# Patient Record
Sex: Female | Born: 1970
Health system: Southern US, Community
[De-identification: ages and names within clinical notes are randomized; demographics above are authoritative.]

## PROBLEM LIST (undated history)

## (undated) DIAGNOSIS — R112 Nausea with vomiting, unspecified: Secondary | ICD-10-CM

## (undated) DIAGNOSIS — J45909 Unspecified asthma, uncomplicated: Secondary | ICD-10-CM

## (undated) DIAGNOSIS — I10 Essential (primary) hypertension: Secondary | ICD-10-CM

## (undated) DIAGNOSIS — R0600 Dyspnea, unspecified: Secondary | ICD-10-CM

## (undated) DIAGNOSIS — F32A Depression, unspecified: Secondary | ICD-10-CM

## (undated) DIAGNOSIS — T7840XA Allergy, unspecified, initial encounter: Secondary | ICD-10-CM

## (undated) DIAGNOSIS — C50919 Malignant neoplasm of unspecified site of unspecified female breast: Secondary | ICD-10-CM

## (undated) DIAGNOSIS — U071 COVID-19: Secondary | ICD-10-CM

## (undated) DIAGNOSIS — E669 Obesity, unspecified: Secondary | ICD-10-CM

## (undated) DIAGNOSIS — R7303 Prediabetes: Secondary | ICD-10-CM

## (undated) DIAGNOSIS — Z9889 Other specified postprocedural states: Secondary | ICD-10-CM

## (undated) DIAGNOSIS — M1990 Unspecified osteoarthritis, unspecified site: Secondary | ICD-10-CM

## (undated) DIAGNOSIS — F419 Anxiety disorder, unspecified: Secondary | ICD-10-CM

## (undated) HISTORY — DX: Essential (primary) hypertension: I10

## (undated) HISTORY — PX: FOOT SURGERY: SHX648

## (undated) HISTORY — DX: Obesity, unspecified: E66.9

## (undated) HISTORY — DX: Malignant neoplasm of unspecified site of unspecified female breast: C50.919

## (undated) HISTORY — DX: Allergy, unspecified, initial encounter: T78.40XA

## (undated) HISTORY — PX: NECK SURGERY: SHX720

## (undated) HISTORY — DX: Unspecified asthma, uncomplicated: J45.909

---

## 2004-05-17 ENCOUNTER — Emergency Department: Payer: Self-pay | Admitting: Emergency Medicine

## 2005-03-18 ENCOUNTER — Ambulatory Visit: Payer: Self-pay | Admitting: Podiatry

## 2006-03-22 ENCOUNTER — Ambulatory Visit: Payer: Self-pay

## 2006-09-04 ENCOUNTER — Ambulatory Visit: Payer: Self-pay | Admitting: Gynecology

## 2007-10-01 ENCOUNTER — Encounter (INDEPENDENT_AMBULATORY_CARE_PROVIDER_SITE_OTHER): Payer: Self-pay | Admitting: Gynecology

## 2007-10-01 ENCOUNTER — Ambulatory Visit: Payer: Self-pay | Admitting: Gynecology

## 2008-10-07 ENCOUNTER — Encounter: Payer: Self-pay | Admitting: Obstetrics & Gynecology

## 2008-10-07 ENCOUNTER — Ambulatory Visit: Payer: Self-pay | Admitting: Obstetrics & Gynecology

## 2008-10-29 ENCOUNTER — Ambulatory Visit: Payer: Self-pay | Admitting: Obstetrics & Gynecology

## 2009-12-29 ENCOUNTER — Ambulatory Visit: Payer: Self-pay | Admitting: Obstetrics & Gynecology

## 2010-01-07 ENCOUNTER — Ambulatory Visit: Payer: Self-pay | Admitting: Obstetrics & Gynecology

## 2010-09-28 ENCOUNTER — Ambulatory Visit: Payer: Self-pay | Admitting: Obstetrics and Gynecology

## 2010-09-28 ENCOUNTER — Ambulatory Visit: Payer: Commercial Indemnity | Admitting: Obstetrics and Gynecology

## 2010-09-28 ENCOUNTER — Encounter: Payer: Self-pay | Admitting: Family Medicine

## 2010-09-28 DIAGNOSIS — Z1272 Encounter for screening for malignant neoplasm of vagina: Secondary | ICD-10-CM

## 2010-09-28 DIAGNOSIS — Z01419 Encounter for gynecological examination (general) (routine) without abnormal findings: Secondary | ICD-10-CM

## 2010-09-28 LAB — CONVERTED CEMR LAB
ALT: 17 U/L
AST: 18 U/L
Albumin: 4.3 g/dL
Alkaline Phosphatase: 61 U/L
BUN: 11 mg/dL
CO2: 22 meq/L
Calcium: 8.7 mg/dL
Chloride: 99 meq/L
Cholesterol: 154 mg/dL
Creatinine, Ser: 0.57 mg/dL
Glucose, Bld: 87 mg/dL
HCT: 41.8 %
HDL: 44 mg/dL
Hemoglobin: 14.6 g/dL
LDL Cholesterol: 95 mg/dL
MCHC: 34.9 g/dL
MCV: 86.9 fL
Platelets: 270 K/uL
Potassium: 4.1 meq/L
RBC: 4.81 M/uL
RDW: 12.6 %
Sodium: 141 meq/L
TSH: 1.304 u[IU]/mL
Total Bilirubin: 0.5 mg/dL
Total CHOL/HDL Ratio: 3.5
Total Protein: 7 g/dL
Triglycerides: 76 mg/dL
VLDL: 15 mg/dL
WBC: 9.1 10*3/microliter

## 2010-11-05 NOTE — Assessment & Plan Note (Signed)
NAME:  Amy Watson, Amy Watson             ACCOUNT NO.:  [id number]  MEDICAL RECORD NO.:  [id number]          PATIENT TYPE:  LOCATION:  [facility] at [facility]           FACILITY:  PHYSICIAN:  Tinnie Gens, MD             DATE OF BIRTH:  DATE OF SERVICE:  09/28/2010                                 CLINIC NOTE  CHIEF COMPLAINT:  Yearly exam and physical.  HISTORY OF PRESENT ILLNESS:  The patient is a 40 year old, gravida 2 para 2, who comes in today for annual exam.  She was last seen in May 2011.  She has been on Necon 1/35 continuously and does not have a cycle for greater than 1 year.  She complains today of some intermittent chest tightness and discomfort and increased need for inhalers.  She feels somewhat lightheaded.  She is concerned about shortness of breath and heart disease, and she has a primary care physician at [facility] who she is instructed to follow up with regarding these issues. Otherwise, she is without specific complaint today.  PAST MEDICAL HISTORY:  Significant for asthma and heart palpitations with a negative workup.  PAST SURGICAL HISTORY:  She had a surgery on her foot.  MEDICATIONS:  She is on Necon 1/35 one p.o. daily, Advair 100/50 two puffs twice daily, albuterol inhaler as needed, 2 puffs twice daily, an herbal supplement that she uses for hot flashes.  ALLERGIES:  None known.  OBSTETRICAL HISTORY:  She is a G2 P2 with two vaginal deliveries.  GYNECOLOGIC HISTORY:  No history of abnormal Pap.  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  She denies any tobacco or drug use.  She is an occasional alcohol drinker, approximately one drink per week.  She is separated from her husband and going to get a divorce.  FAMILY HISTORY:  She reports her mother had breast cancer at age 71. The patient's last mammogram was in 2011 and was normal.  She has skin cancer in her grandfather.  Her father has recently been diagnosed  with hypertension.  Diabetes.  A 14-point review of systems is reviewed.  At her previous GYN visit, she had blood in her stools and found to have internal hemorrhoids. This is being followed by her PCP.  She denies headache, vision changes, nausea, vomiting, diarrhea, constipation, blood in her stool, blood in her urine, swelling of her feet or ankles, otherwise, see HPI for the remainder of review of systems.  PHYSICAL EXAMINATION:  GENERAL:  Today, she is an obese female, in no acute distress. HEENT:  Normocephalic, atraumatic.  Sclerae anicteric. NECK:  Supple.  Normal thyroid. LUNGS:  Clear bilaterally. CARDIOVASCULAR:  Regular rate and rhythm without rubs, gallops, or murmurs. ABDOMEN:  Soft, nontender, nondistended. EXTREMITIES:  No cyanosis, clubbing, or edema. BREASTS:  Symmetric with everted nipples.  No masses.  No supraclavicular or axillary adenopathy. GENITOURINARY:  Normal external female genitalia.  BUS normal.  Vagina is pink and rugated.  Cervix is parous without lesion.  Uterus is small, anteverted.  No adnexal mass.  Genital exam is limited by body habitus.  IMPRESSION: 1. Gynecologic exam with Pap. 2. Chest pain.  PLAN: 1. We  will get fasting labs today to include CBC, CMP, lipid, and TSH. 2. Pap smear today. 3. Schedule mammogram for 1 year since her last one. 4. Follow up with her PCP regarding her chest pain, shortness of     breath.          ______________________________ Tinnie Gens, MD    TP/MEDQ  D:  09/28/2010  T:  09/29/2010  Job:  [id number]

## 2010-12-28 NOTE — Assessment & Plan Note (Signed)
NAME:  Amy Watson, Amy Watson             ACCOUNT NO.:  [id number]   MEDICAL RECORD NO.:  [id number]          PATIENT TYPE:  POB   LOCATION:  [facility] at [facility]         FACILITY:  [facility]   PHYSICIAN:  Jaynie Collins, MD     DATE OF BIRTH:  [birth year]   DATE OF SERVICE:                                  CLINIC NOTE   CHIEF COMPLAINT:  Annual examination.   HISTORY OF PRESENT ILLNESS:  The patient is a 40 year old gravida 2,  para 2 who is here today for her annual gynecologic examination.  The  patient was last seen in February 2010 and she was complaining of many  symptoms, was concerned about going through possible perimenopausal  premature ovarian failure.  She was more worried about the mood swings  at that time which she says interfered with her activities of daily  living. As a result, she was started on Effexor XR 75 mg daily.  Today,  the patient does report that her symptoms are significantly improved on  the Effexor and she denies any gynecologic issues.  She denies any  abnormal vaginal bleeding, discharge, or any other concerns.   PAST OBSTETRIC/GYNECOLOGIC HISTORY:  The patient is amenorrheic, Necon  1/35.  She has had 2 vaginal deliveries.  Normal Pap smears, the last  one was last year.  She is on Necon 1/35 for contraception, but is not  sexually active currently.   PAST MEDICAL HISTORY:  1. Asthma.  2. Heart palpitations with negative evaluation.   PAST SURGICAL HISTORY:  Foot surgery.   MEDICATIONS:  1. Necon 1/35 one tablet daily.  2. Advair 100/50 two puffs inhaled twice a day.  3. Albuterol inhaler as needed.  4. Effexor XR 75 mg daily.   ALLERGIES:  No known drug allergies.   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  She denies any tobacco, alcohol, or illicit drug use.  She  denies any past or current history of physical or sexual abuse.  The  patient is undergoing a separation from her husband currently.   FAMILY  HISTORY:  Remarkable for her mother being diagnosed at age 22  with breast cancer.  The patient's last mammogram in 2010 was normal and  will be scheduled for another mammogram at the end of this visit.  Otherwise, she denies any other family history of any gynecologic,  colon, or any other cancers.   REVIEW OF SYSTEMS:  The patient endorses having blood in her stool that  she has noticed over the past couple of days, otherwise no other  symptoms.   PHYSICAL EXAMINATION:  VITAL SIGNS:  Blood pressure 147/96, pulse 98,  weight 217 pounds, height 5 feet 6 inches.  GENERAL:  No apparent distress.  HEENT:  Normocephalic, atraumatic.  LUNGS:  Clear to auscultation bilaterally.  HEART:  Regular rate and rhythm.  NECK:  Supple.  No masses.  BREASTS:  Symmetric in size, soft, nontender, no abnormal masses, skin  changes, nipple discharge, or lymphadenopathy noted.  ABDOMEN:  Soft,  nontender, nondistended.  EXTREMITIES:  No cyanosis, clubbing, or edema.  PELVIC:  Normal external female genitalia.  Pink well rugated vagina.  Normal cervix with prominent ectropion.  Pap smear was obtained.  On  bimanual exam, the patient has a small mobile, nontender uterus, and  normal adnexa bilaterally.   ASSESSMENT AND PLAN:  The patient is a 40 year old gravida 2, para 2,  here for annual physical examination.  She had a normal breast  examination today and we will go ahead and order her for a mammogram for  her screening.  We will also follow up on the results of her Pap smear.  The patient does want refill for Necon 1/35 and her Effexor and this  will be given to her at the end of this encounter.  As showed, blood in  the stool, the patient was told to go to her primary care physician as  soon as possible.  On examination, no external hemorrhoids was noted, so  she needs evaluation for either internal structural anomalies or to rule  out neoplasia or any other causes for a gastrointestinal bleed.   The  patient was told to come in or call for any further gynecologic  concerns.           ______________________________  Jaynie Collins, MD     UA/MEDQ  D:  12/29/2009  T:  12/30/2009  Job:  [id number]

## 2010-12-28 NOTE — Assessment & Plan Note (Signed)
NAME:  Amy Watson, Amy Watson             ACCOUNT NO.:  [id number]   MEDICAL RECORD NO.:  [id number]          PATIENT TYPE:  POB   LOCATION:  [facility]         FACILITY:  [facility]   PHYSICIAN:  Johnella Moloney, MD        DATE OF BIRTH:  1971/06/08   DATE OF SERVICE:  10/07/2008                                  CLINIC NOTE   CHIEF COMPLAINT:  Annual exam, possible perimenopausal symptoms.   HISTORY OF PRESENT ILLNESS:  The patient is a 40 year old gravida 2,  para 2 who is here for her annual examination.  The patient has Amy Watson  followed in this practice for many years and was initially on the [facility]  1/58 for several years, which resulted in amenorrhea in January 2008.  She was then placed on Necon 135 and had multiple labs at that visit  which were negative.  She has continued to be amenorrheic for several  years.  In the last few months the patient does report that she has  noted having a lot of night sweats, hot flashes and very concerning mood  swings.  The patient is interested in discussing management of her mood  swings especially which she said interfere with her activities of daily  living.  She has no other gynecologic concerns.   PAST OBSTETRICAL/GYNECOLOGICAL HISTORY:  Menstrual history is as above.  The patient had two vaginal deliveries.  She had normal Pap smears.  Her  last one was on October 01, 2007.  The patient is on the Neecon135 for  contraception and she is sexually active with her husband and has no  concerns.   PAST MEDICAL HISTORY:  1. Asthma.  2. Heart palpitations and the patient is awaiting evaluation for her      palpitations.   PAST SURGICAL HISTORY:  Foot surgery.   MEDICATIONS:  1. Necon 135 p.o. daily.  2. Advair 100/50 two puffs inhaled b.i.d.  3. Albuterol inhaler as needed.   ALLERGIES:  No known drug allergies.   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  She denies any tobacco, alcohol or illicit drug  use.  She also  denies any past or current history of physical or sexual abuse.   FAMILY HISTORY:  Remarkable for her mother being diagnosed at age 23  with breast cancer.  The patient has not had any mammograms or other  screening in several years.  Her last mammogram in our system was in  2007 which was normal.   REVIEW OF SYSTEMS:  A 14-point comprehensive review of systems was  reviewed with the patient and was entirely negative apart from the  perimenopausal symptoms mentioned in the HPI.   PHYSICAL EXAMINATION:  Pulse 96, blood pressure 124/92, weight 195.5  pounds, height 5 feet 6 inches.  GENERAL:  No apparent distress.  HEENT:  Normocephalic, atraumatic.  LUNGS:  Clear to auscultation bilaterally.  HEART:  Regular rate and rhythm.  NECK:  Supple.  No masses.  BREASTS:  Symmetric, nontender.  No abnormal masses, skin changes,  nipple discharge or lymphadenopathy palpated or noted.  ABDOMEN:  Soft, nontender, nondistended.  EXTREMITIES:  No cyanosis,  clubbing or edema and nontender.  PELVIC:  Normal external female genitalia.  Pink well rugated vagina.  Normal cervix with prominent ectropion.  While obtaining a Pap smear the  patient was noted to have a significant amount of bleeding.  This could  be secondary to her ectropion but gonorrhea, chlamydia will be  reflexively checked on her Pap smear sample to rule out cervicitis.  On  bimanual exam the patient has a small mobile, nontender uterus and  normal adnexa.   ASSESSMENT/PLAN:  The patient is a 40 year old gravida 2, para 2 here  for her annual physical examination.  The patient also reports  perimenopausal symptoms.  The patient had a normal breast examination  today and given her family history of breast cancer in a first-degree  relative, we will go ahead and order a mammogram for screening.  We will  follow up on the results of this.  We will also follow up on the results  of her Pap smear.  As for her menopausal  symptoms, the patient was told  about the different modalities of treatment including hormonal treatment  and non-hormonal treatments.  The patient is already on Necon 135 which  she was told is the best hormonal therapy we can give her at this point  and she feels like her symptoms are not managed  just on the Necon.  After discussion about alternatives of hormonal therapy, the patient is  interested in trying Effexor.  She was given a prescription for Effexor  XR 75 mg p.o. daily and told that it would take a few weeks for it to  build up to a steady state in her system for effects to be seen.  The  patient was also told to go to the website www.menopause.org of the  American Menopausal Society for other suggestions about how to deal with  her menopausal symptoms including lifestyle changes, being in cool areas  and trying to wear light clothing to bed, etcetera.  She was told to  come back to the clinic if her symptoms worsen or if she wants to try  another modality for treatment.           ______________________________  Johnella Moloney, MD     UD/MEDQ  D:  10/07/2008  T:  10/07/2008  Job:  [id number]

## 2011-06-28 ENCOUNTER — Ambulatory Visit: Payer: Self-pay | Admitting: Allergy

## 2011-07-27 ENCOUNTER — Ambulatory Visit: Payer: Self-pay | Admitting: Obstetrics & Gynecology

## 2011-08-17 ENCOUNTER — Ambulatory Visit: Payer: Self-pay

## 2012-02-07 ENCOUNTER — Ambulatory Visit: Payer: Managed Care, Other (non HMO) | Admitting: Obstetrics & Gynecology

## 2012-02-20 ENCOUNTER — Other Ambulatory Visit: Payer: Self-pay | Admitting: Obstetrics & Gynecology

## 2012-02-27 DIAGNOSIS — R232 Flushing: Secondary | ICD-10-CM | POA: Insufficient documentation

## 2012-02-27 DIAGNOSIS — E669 Obesity, unspecified: Secondary | ICD-10-CM | POA: Insufficient documentation

## 2012-03-01 ENCOUNTER — Encounter: Payer: Self-pay | Admitting: Obstetrics & Gynecology

## 2012-03-01 ENCOUNTER — Ambulatory Visit (INDEPENDENT_AMBULATORY_CARE_PROVIDER_SITE_OTHER): Payer: BC Managed Care – PPO | Admitting: Obstetrics & Gynecology

## 2012-03-01 VITALS — BP 129/87 | HR 90 | Ht 66.0 in | Wt 223.0 lb

## 2012-03-01 DIAGNOSIS — Z113 Encounter for screening for infections with a predominantly sexual mode of transmission: Secondary | ICD-10-CM

## 2012-03-01 DIAGNOSIS — Z124 Encounter for screening for malignant neoplasm of cervix: Secondary | ICD-10-CM

## 2012-03-01 DIAGNOSIS — Z0000 Encounter for general adult medical examination without abnormal findings: Secondary | ICD-10-CM

## 2012-03-01 MED ORDER — NORETHINDRONE-ETH ESTRADIOL 0.5-35 MG-MCG PO TABS: 1.0000 | ORAL_TABLET | Freq: Every day | ORAL | Status: DC

## 2012-03-01 NOTE — Progress Notes (Signed)
Yearly exam/ mother dx with breast cancer 35-40.

## 2012-03-01 NOTE — Progress Notes (Signed)
Subjective:    Amy Watson is a 41 y.o. female who presents for an annual exam. The patient has no complaints today. She stopped taking Effexor because it caused her non-aura type migraines. She still has hot flashes on the OCPs. She would like to change back to the Necon that she was on earlier.The patient is not currently sexually active. GYN screening history: last pap: was normal. The patient wears seatbelts: yes. The patient participates in regular exercise: no. Has the patient ever been transfused or tattooed?: yes. (tattoos)The patient reports that there is not domestic violence in her life.   Menstrual History: OB History    Grav Para Term Preterm Abortions TAB SAB Ect Mult Living   2 2              Menarche age: 80 No LMP recorded. Patient is not currently having periods (Reason: Perimenopausal).    The following portions of the patient's history were reviewed and updated as appropriate: allergies, current medications, past family history, past medical history, past social history, past surgical history and problem list.  Review of Systems A comprehensive review of systems was negative. She is now divorced and not sexually active. She has 82 and 43 yo children.   Objective:    BP 129/87  Pulse 90  Ht 5\' 6"  (1.676 m)  Wt 223 lb (101.152 kg)  BMI 35.99 kg/m2  General Appearance:    Alert, cooperative, no distress, appears stated age  Head:    Normocephalic, without obvious abnormality, atraumatic  Eyes:    PERRL, conjunctiva/corneas clear, EOM's intact, fundi    benign, both eyes  Ears:    Normal TM's and external ear canals, both ears  Nose:   Nares normal, septum midline, mucosa normal, no drainage    or sinus tenderness  Throat:   Lips, mucosa, and tongue normal; teeth and gums normal  Neck:   Supple, symmetrical, trachea midline, no adenopathy;    thyroid:  no enlargement/tenderness/nodules; no carotid   bruit or JVD  Back:     Symmetric, no curvature, ROM normal,  no CVA tenderness  Lungs:     Clear to auscultation bilaterally, respirations unlabored  Chest Wall:    No tenderness or deformity   Heart:    Regular rate and rhythm, S1 and S2 normal, no murmur, rub   or gallop  Breast Exam:    No tenderness, masses, or nipple abnormality  Abdomen:     Soft, non-tender, bowel sounds active all four quadrants,    no masses, no organomegaly  Genitalia:    Normal female without lesion, discharge or tenderness, no atrophy, normal cervix with ectropion, NSSA, NT, no adnexal masses     Extremities:   Extremities normal, atraumatic, no cyanosis or edema  Pulses:   2+ and symmetric all extremities  Skin:   Skin color, texture, turgor normal, no rashes or lesions  Lymph nodes:   Cervical, supraclavicular, and axillary nodes normal  Neurologic:   CNII-XII intact, normal strength, sensation and reflexes    throughout  .    Assessment:    Healthy female exam.    Plan:     Mammogram. Pap smear.  Screening labs in the future (when fasting) I will change her OCPs back to [employer]

## 2012-03-06 ENCOUNTER — Other Ambulatory Visit: Payer: BC Managed Care – PPO | Admitting: *Deleted

## 2012-03-06 DIAGNOSIS — Z0000 Encounter for general adult medical examination without abnormal findings: Secondary | ICD-10-CM

## 2012-03-06 LAB — CBC
HCT: 39.8 % (ref 36.0–46.0)
MCV: 89 fL (ref 78.0–100.0)
Platelets: 260 10*3/uL (ref 150–400)
RBC: 4.47 MIL/uL (ref 3.87–5.11)
RDW: 13.1 % (ref 11.5–15.5)
WBC: 6.8 10*3/uL (ref 4.0–10.5)

## 2012-03-07 LAB — COMPREHENSIVE METABOLIC PANEL
ALT: 17 U/L (ref 0–35)
AST: 14 U/L (ref 0–37)
Albumin: 3.8 g/dL (ref 3.5–5.2)
CO2: 18 mEq/L — ABNORMAL LOW (ref 19–32)
Calcium: 8.2 mg/dL — ABNORMAL LOW (ref 8.4–10.5)
Chloride: 111 mEq/L (ref 96–112)
Potassium: 4.3 mEq/L (ref 3.5–5.3)

## 2012-03-07 LAB — TSH: TSH: 2.203 u[IU]/mL (ref 0.350–4.500)

## 2012-03-07 LAB — LIPID PANEL: Cholesterol: 143 mg/dL (ref 0–200)

## 2012-08-28 ENCOUNTER — Ambulatory Visit: Payer: Self-pay | Admitting: Nurse Practitioner

## 2012-09-12 ENCOUNTER — Ambulatory Visit (INDEPENDENT_AMBULATORY_CARE_PROVIDER_SITE_OTHER): Payer: BC Managed Care – PPO | Admitting: Obstetrics & Gynecology

## 2012-09-12 ENCOUNTER — Encounter: Payer: Self-pay | Admitting: Obstetrics & Gynecology

## 2012-09-12 ENCOUNTER — Ambulatory Visit: Payer: BC Managed Care – PPO | Admitting: Obstetrics & Gynecology

## 2012-09-12 VITALS — BP 134/88 | HR 90 | Ht 66.0 in | Wt 211.0 lb

## 2012-09-12 DIAGNOSIS — F419 Anxiety disorder, unspecified: Secondary | ICD-10-CM

## 2012-09-12 DIAGNOSIS — F411 Generalized anxiety disorder: Secondary | ICD-10-CM

## 2012-09-12 MED ORDER — VENLAFAXINE HCL 37.5 MG PO TABS: 37.5000 mg | ORAL_TABLET | Freq: Two times a day (BID) | ORAL | Status: DC

## 2012-09-12 NOTE — Progress Notes (Signed)
  Subjective:    Patient ID: Amy Watson, female    DOB: Jan 02, 1971, 42 y.o.   MRN: [id number]  HPI  42 yo SW who was here for annual 7/13 and requested to restart her Necon for birth control and for control of hot flashes. She was seen by an Integrative therapist Pieter Partridge Something) in August 2013. She had blood tests and tells me "I am fully in menopause". She was put on progesterone and estradiol. She also had food allergy tests there and found multiple food allergies. She is here today because she would like to have a another prescription for "anxiety medication" ( Dr. Macon Large prescribed her effexor). She would like to restart this. She denies SI and HI.  Review of Systems     Objective:   Physical Exam        Assessment & Plan:  Anxiety, lots of stress (divorce, a "cutting" daughter)- restart effexor 43, start daily, increase BID prn RTC 1 month

## 2012-10-12 ENCOUNTER — Ambulatory Visit: Payer: BC Managed Care – PPO | Admitting: Obstetrics & Gynecology

## 2012-12-26 ENCOUNTER — Ambulatory Visit: Payer: Self-pay | Admitting: Obstetrics & Gynecology

## 2013-01-10 ENCOUNTER — Ambulatory Visit (INDEPENDENT_AMBULATORY_CARE_PROVIDER_SITE_OTHER): Payer: BC Managed Care – PPO | Admitting: Obstetrics & Gynecology

## 2013-01-10 ENCOUNTER — Ambulatory Visit: Payer: Self-pay | Admitting: Obstetrics & Gynecology

## 2013-01-10 ENCOUNTER — Encounter: Payer: Self-pay | Admitting: Obstetrics & Gynecology

## 2013-01-10 VITALS — BP 124/81 | HR 56 | Resp 16 | Ht 66.0 in | Wt 204.0 lb

## 2013-01-10 DIAGNOSIS — Z0000 Encounter for general adult medical examination without abnormal findings: Secondary | ICD-10-CM

## 2013-01-10 DIAGNOSIS — Z124 Encounter for screening for malignant neoplasm of cervix: Secondary | ICD-10-CM

## 2013-01-10 DIAGNOSIS — Z01419 Encounter for gynecological examination (general) (routine) without abnormal findings: Secondary | ICD-10-CM

## 2013-01-10 DIAGNOSIS — Z1151 Encounter for screening for human papillomavirus (HPV): Secondary | ICD-10-CM

## 2013-01-10 LAB — COMPREHENSIVE METABOLIC PANEL
Albumin: 3.9 g/dL (ref 3.5–5.2)
CO2: 24 mEq/L (ref 19–32)
Calcium: 8.8 mg/dL (ref 8.4–10.5)
Glucose, Bld: 87 mg/dL (ref 70–99)
Potassium: 4.1 mEq/L (ref 3.5–5.3)
Sodium: 137 mEq/L (ref 135–145)
Total Protein: 6.6 g/dL (ref 6.0–8.3)

## 2013-01-10 LAB — CBC
Hemoglobin: 13.8 g/dL (ref 12.0–15.0)
MCH: 30.5 pg (ref 26.0–34.0)
RBC: 4.53 MIL/uL (ref 3.87–5.11)

## 2013-01-10 LAB — TSH: TSH: 1.296 u[IU]/mL (ref 0.350–4.500)

## 2013-01-10 NOTE — Progress Notes (Signed)
Subjective:    Amy Watson is a 42 y.o. female who presents for an annual exam. The patient has no complaints today. The patient is not currently sexually active. GYN screening history: last pap: was normal. The patient wears seatbelts: yes. The patient participates in regular exercise: yes. Has the patient ever been transfused or tattooed?: yes. (tattoo) The patient reports that there is not domestic violence in her life.   Menstrual History: OB History   Grav Para Term Preterm Abortions TAB SAB Ect Mult Living   2 2        2       Menarche age: 61  No LMP recorded. Patient is not currently having periods (Reason: Perimenopausal).    The following portions of the patient's history were reviewed and updated as appropriate: allergies, current medications, past family history, past medical history, past social history, past surgical history and problem list.  Review of Systems A comprehensive review of systems was negative. Her kids are 22 and 90 yo. 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)   Objective:    BP 124/81  Pulse 56  Resp 16  Ht 5\' 6"  (1.676 m)  Wt 204 lb (92.534 kg)  BMI 32.94 kg/m2  General Appearance:    Alert, cooperative, no distress, appears stated age  Head:    Normocephalic, without obvious abnormality, atraumatic  Eyes:    PERRL, conjunctiva/corneas clear, EOM's intact, fundi    benign, both eyes  Ears:    Normal TM's and external ear canals, both ears  Nose:   Nares normal, septum midline, mucosa normal, no drainage    or sinus tenderness  Throat:   Lips, mucosa, and tongue normal; teeth and gums normal  Neck:   Supple, symmetrical, trachea midline, no adenopathy;    thyroid:  no enlargement/tenderness/nodules; no carotid   bruit or JVD  Back:     Symmetric, no curvature, ROM normal, no CVA tenderness  Lungs:     Clear to auscultation bilaterally, respirations unlabored  Chest Wall:    No tenderness or deformity   Heart:    Regular rate and rhythm, S1  and S2 normal, no murmur, rub   or gallop  Breast Exam:    No tenderness, masses, or nipple abnormality  Abdomen:     Soft, non-tender, bowel sounds active all four quadrants,    no masses, no organomegaly  Genitalia:    Normal female without lesion, discharge or tenderness     Extremities:   Extremities normal, atraumatic, no cyanosis or edema  Pulses:   2+ and symmetric all extremities  Skin:   Skin color, texture, turgor normal, no rashes or lesions  Lymph nodes:   Cervical, supraclavicular, and axillary nodes normal  Neurologic:   CNII-XII intact, normal strength, sensation and reflexes    throughout  .    Assessment:    Healthy female exam.    Plan:     Thin prep Pap smear.  Fasting labs BRCA testing per pt request (Her mother had breast cancer at 66 yo)

## 2013-01-24 ENCOUNTER — Telehealth: Payer: Self-pay | Admitting: *Deleted

## 2013-01-24 NOTE — Telephone Encounter (Signed)
Pt called adv she needed lipid panel completed at last visit for insurance forms but wasn't done - I adv last lipid was less than one year ago and if insurance needs one more recent than 02-2012 then she could come in for that one test any morning but be sure she is fasting - Pt understood and wants to be sure lipids, tsh, cbc, and cmp completed annually when she comes in for pap.

## 2013-06-20 ENCOUNTER — Other Ambulatory Visit: Payer: Self-pay

## 2014-01-15 ENCOUNTER — Ambulatory Visit (INDEPENDENT_AMBULATORY_CARE_PROVIDER_SITE_OTHER): Payer: BC Managed Care – PPO | Admitting: Obstetrics & Gynecology

## 2014-01-15 VITALS — BP 137/90 | HR 79 | Ht 66.0 in | Wt 220.6 lb

## 2014-01-15 DIAGNOSIS — Z01419 Encounter for gynecological examination (general) (routine) without abnormal findings: Secondary | ICD-10-CM

## 2014-01-15 DIAGNOSIS — Z0000 Encounter for general adult medical examination without abnormal findings: Secondary | ICD-10-CM

## 2014-01-15 DIAGNOSIS — Z124 Encounter for screening for malignant neoplasm of cervix: Secondary | ICD-10-CM

## 2014-01-15 DIAGNOSIS — Z1151 Encounter for screening for human papillomavirus (HPV): Secondary | ICD-10-CM

## 2014-01-15 LAB — COMPREHENSIVE METABOLIC PANEL
ALT: 17 U/L (ref 0–35)
AST: 15 U/L (ref 0–37)
Albumin: 4.3 g/dL (ref 3.5–5.2)
Alkaline Phosphatase: 63 U/L (ref 39–117)
BILIRUBIN TOTAL: 0.6 mg/dL (ref 0.2–1.2)
BUN: 12 mg/dL (ref 6–23)
CALCIUM: 9.1 mg/dL (ref 8.4–10.5)
CHLORIDE: 103 meq/L (ref 96–112)
CO2: 27 mEq/L (ref 19–32)
CREATININE: 0.55 mg/dL (ref 0.50–1.10)
GLUCOSE: 81 mg/dL (ref 70–99)
Potassium: 4.4 mEq/L (ref 3.5–5.3)
Sodium: 139 mEq/L (ref 135–145)
Total Protein: 7 g/dL (ref 6.0–8.3)

## 2014-01-15 LAB — LIPID PANEL
CHOL/HDL RATIO: 3.3 ratio
CHOLESTEROL: 157 mg/dL (ref 0–200)
HDL: 47 mg/dL (ref >39)
LDL Cholesterol: 84 mg/dL (ref 0–99)
Triglycerides: 132 mg/dL (ref <150)
VLDL: 26 mg/dL (ref 0–40)

## 2014-01-15 LAB — CBC
HEMATOCRIT: 41.7 % (ref 36.0–46.0)
HEMOGLOBIN: 14.4 g/dL (ref 12.0–15.0)
MCH: 30.8 pg (ref 26.0–34.0)
MCHC: 34.5 g/dL (ref 30.0–36.0)
MCV: 89.1 fL (ref 78.0–100.0)
Platelets: 283 10*3/uL (ref 150–400)
RBC: 4.68 MIL/uL (ref 3.87–5.11)
RDW: 13 % (ref 11.5–15.5)
WBC: 9.1 10*3/uL (ref 4.0–10.5)

## 2014-01-15 LAB — TSH: TSH: 1.646 u[IU]/mL (ref 0.350–4.500)

## 2014-01-15 NOTE — Progress Notes (Signed)
Subjective:    Tinaya Ceballos is a 43 y.o. female who presents for an annual exam. The patient has no complaints today. The patient is not currently sexually active. GYN screening history: last pap: was normal. The patient wears seatbelts: yes. The patient participates in regular exercise: yes. Has the patient ever been transfused or tattooed?: yes. The patient reports that there is not domestic violence in her life.   Menstrual History: OB History   Grav Para Term Preterm Abortions TAB SAB Ect Mult Living   2 2        2       Menarche age: 10  No LMP recorded. Patient is not currently having periods (Reason: Perimenopausal).    The following portions of the patient's history were reviewed and updated as appropriate: allergies, current medications, past family history, past medical history, past social history, past surgical history and problem list.  Review of Systems A comprehensive review of systems was negative. She has 2 daughters ages 78 and 24. Works 2 jobs.   Objective:    BP 137/90  Pulse 79  Ht 5\' 6"  (1.676 m)  Wt 220 lb 9.6 oz (100.064 kg)  BMI 35.62 kg/m2  General Appearance:    Alert, cooperative, no distress, appears stated age  Head:    Normocephalic, without obvious abnormality, atraumatic  Eyes:    PERRL, conjunctiva/corneas clear, EOM's intact, fundi    benign, both eyes  Ears:    Normal TM's and external ear canals, both ears  Nose:   Nares normal, septum midline, mucosa normal, no drainage    or sinus tenderness  Throat:   Lips, mucosa, and tongue normal; teeth and gums normal  Neck:   Supple, symmetrical, trachea midline, no adenopathy;    thyroid:  no enlargement/tenderness/nodules; no carotid   bruit or JVD  Back:     Symmetric, no curvature, ROM normal, no CVA tenderness  Lungs:     Clear to auscultation bilaterally, respirations unlabored  Chest Wall:    No tenderness or deformity   Heart:    Regular rate and rhythm, S1 and S2 normal, no murmur, rub    or gallop  Breast Exam:    No tenderness, masses, or nipple abnormality  Abdomen:     Soft, non-tender, bowel sounds active all four quadrants,    no masses, no organomegaly  Genitalia:    Normal female without lesion, discharge or tenderness, NSSA, NT, mobile, normal adnexal exam     Extremities:   Extremities normal, atraumatic, no cyanosis or edema  Pulses:   2+ and symmetric all extremities  Skin:   Skin color, texture, turgor normal, no rashes or lesions  Lymph nodes:   Cervical, supraclavicular, and axillary nodes normal  Neurologic:   CNII-XII intact, normal strength, sensation and reflexes    throughout  .    Assessment:    Healthy female exam.    Plan:     Breast self exam technique reviewed and patient encouraged to perform self-exam monthly. Thin prep Pap smear. with cotesting (I did alert her to ACOG rec regarding paps). Fasting lab work Programmer, systems

## 2014-01-16 ENCOUNTER — Encounter: Payer: Self-pay | Admitting: Obstetrics & Gynecology

## 2014-01-16 LAB — CYTOLOGY - PAP

## 2014-03-18 ENCOUNTER — Ambulatory Visit: Payer: Self-pay | Admitting: Obstetrics & Gynecology

## 2014-04-18 ENCOUNTER — Ambulatory Visit: Payer: Self-pay | Admitting: Obstetrics & Gynecology

## 2014-06-16 ENCOUNTER — Encounter: Payer: Self-pay | Admitting: Obstetrics & Gynecology

## 2014-06-18 DIAGNOSIS — J45909 Unspecified asthma, uncomplicated: Secondary | ICD-10-CM | POA: Insufficient documentation

## 2014-06-18 DIAGNOSIS — F419 Anxiety disorder, unspecified: Secondary | ICD-10-CM | POA: Insufficient documentation

## 2014-06-18 DIAGNOSIS — I10 Essential (primary) hypertension: Secondary | ICD-10-CM | POA: Insufficient documentation

## 2014-06-18 DIAGNOSIS — F32A Depression, unspecified: Secondary | ICD-10-CM | POA: Insufficient documentation

## 2014-06-18 DIAGNOSIS — F329 Major depressive disorder, single episode, unspecified: Secondary | ICD-10-CM | POA: Insufficient documentation

## 2014-09-16 ENCOUNTER — Ambulatory Visit (INDEPENDENT_AMBULATORY_CARE_PROVIDER_SITE_OTHER): Payer: BLUE CROSS/BLUE SHIELD | Admitting: Podiatry

## 2014-09-16 ENCOUNTER — Encounter: Payer: Self-pay | Admitting: Podiatry

## 2014-09-16 VITALS — BP 122/76 | HR 90 | Resp 18

## 2014-09-16 DIAGNOSIS — M722 Plantar fascial fibromatosis: Secondary | ICD-10-CM

## 2014-09-16 MED ORDER — MELOXICAM 7.5 MG PO TABS: 7.5000 mg | ORAL_TABLET | Freq: Every day | ORAL | Status: DC

## 2014-09-16 MED ORDER — TRIAMCINOLONE ACETONIDE 10 MG/ML IJ SUSP
10.0000 mg | Freq: Once | INTRAMUSCULAR | Status: AC
  Administered 2014-09-16: 10 mg

## 2014-09-16 NOTE — Patient Instructions (Signed)
Plantar Fasciitis (Heel Spur Syndrome) with Rehab The plantar fascia is a fibrous, ligament-like, soft-tissue structure that spans the bottom of the foot. Plantar fasciitis is a condition that causes pain in the foot due to inflammation of the tissue. SYMPTOMS   Pain and tenderness on the underneath side of the foot.  Pain that worsens with standing or walking. CAUSES  Plantar fasciitis is caused by irritation and injury to the plantar fascia on the underneath side of the foot. Common mechanisms of injury include:  Direct trauma to bottom of the foot.  Damage to a small nerve that runs under the foot where the main fascia attaches to the heel bone.  Stress placed on the plantar fascia due to bone spurs. RISK INCREASES WITH:   Activities that place stress on the plantar fascia (running, jumping, pivoting, or cutting).  Poor strength and flexibility.  Improperly fitted shoes.  Tight calf muscles.  Flat feet.  Failure to warm-up properly before activity.  Obesity. PREVENTION  Warm up and stretch properly before activity.  Allow for adequate recovery between workouts.  Maintain physical fitness:  Strength, flexibility, and endurance.  Cardiovascular fitness.  Maintain a health body weight.  Avoid stress on the plantar fascia.  Wear properly fitted shoes, including arch supports for individuals who have flat feet. PROGNOSIS  If treated properly, then the symptoms of plantar fasciitis usually resolve without surgery. However, occasionally surgery is necessary. RELATED COMPLICATIONS   Recurrent symptoms that may result in a chronic condition.  Problems of the lower back that are caused by compensating for the injury, such as limping.  Pain or weakness of the foot during push-off following surgery.  Chronic inflammation, scarring, and partial or complete fascia tear, occurring more often from repeated injections. TREATMENT  Treatment initially involves the use of  ice and medication to help reduce pain and inflammation. The use of strengthening and stretching exercises may help reduce pain with activity, especially stretches of the Achilles tendon. These exercises may be performed at home or with a therapist. Your caregiver may recommend that you use heel cups of arch supports to help reduce stress on the plantar fascia. Occasionally, corticosteroid injections are given to reduce inflammation. If symptoms persist for greater than 6 months despite non-surgical (conservative), then surgery may be recommended.  MEDICATION   If pain medication is necessary, then nonsteroidal anti-inflammatory medications, such as aspirin and ibuprofen, or other minor pain relievers, such as acetaminophen, are often recommended.  Do not take pain medication within 7 days before surgery.  Prescription pain relievers may be given if deemed necessary by your caregiver. Use only as directed and only as much as you need.  Corticosteroid injections may be given by your caregiver. These injections should be reserved for the most serious cases, because they may only be given a certain number of times. HEAT AND COLD  Cold treatment (icing) relieves pain and reduces inflammation. Cold treatment should be applied for 10 to 15 minutes every 2 to 3 hours for inflammation and pain and immediately after any activity that aggravates your symptoms. Use ice packs or massage the area with a piece of ice (ice massage).  Heat treatment may be used prior to performing the stretching and strengthening activities prescribed by your caregiver, physical therapist, or athletic trainer. Use a heat pack or soak the injury in warm water. SEEK IMMEDIATE MEDICAL CARE IF:  Treatment seems to offer no benefit, or the condition worsens.  Any medications produce adverse side effects. EXERCISES RANGE   OF MOTION (ROM) AND STRETCHING EXERCISES - Plantar Fasciitis (Heel Spur Syndrome) These exercises may help you  when beginning to rehabilitate your injury. Your symptoms may resolve with or without further involvement from your physician, physical therapist or athletic trainer. While completing these exercises, remember:   Restoring tissue flexibility helps normal motion to return to the joints. This allows healthier, less painful movement and activity.  An effective stretch should be held for at least 30 seconds.  A stretch should never be painful. You should only feel a gentle lengthening or release in the stretched tissue. RANGE OF MOTION - Toe Extension, Flexion  Sit with your right / left leg crossed over your opposite knee.  Grasp your toes and gently pull them back toward the top of your foot. You should feel a stretch on the bottom of your toes and/or foot.  Hold this stretch for __________ seconds.  Now, gently pull your toes toward the bottom of your foot. You should feel a stretch on the top of your toes and or foot.  Hold this stretch for __________ seconds. Repeat __________ times. Complete this stretch __________ times per day.  RANGE OF MOTION - Ankle Dorsiflexion, Active Assisted  Remove shoes and sit on a chair that is preferably not on a carpeted surface.  Place right / left foot under knee. Extend your opposite leg for support.  Keeping your heel down, slide your right / left foot back toward the chair until you feel a stretch at your ankle or calf. If you do not feel a stretch, slide your bottom forward to the edge of the chair, while still keeping your heel down.  Hold this stretch for __________ seconds. Repeat __________ times. Complete this stretch __________ times per day.  STRETCH - Gastroc, Standing  Place hands on wall.  Extend right / left leg, keeping the front knee somewhat bent.  Slightly point your toes inward on your back foot.  Keeping your right / left heel on the floor and your knee straight, shift your weight toward the wall, not allowing your back to  arch.  You should feel a gentle stretch in the right / left calf. Hold this position for __________ seconds. Repeat __________ times. Complete this stretch __________ times per day. STRETCH - Soleus, Standing  Place hands on wall.  Extend right / left leg, keeping the other knee somewhat bent.  Slightly point your toes inward on your back foot.  Keep your right / left heel on the floor, bend your back knee, and slightly shift your weight over the back leg so that you feel a gentle stretch deep in your back calf.  Hold this position for __________ seconds. Repeat __________ times. Complete this stretch __________ times per day. STRETCH - Gastrocsoleus, Standing  Note: This exercise can place a lot of stress on your foot and ankle. Please complete this exercise only if specifically instructed by your caregiver.   Place the ball of your right / left foot on a step, keeping your other foot firmly on the same step.  Hold on to the wall or a rail for balance.  Slowly lift your other foot, allowing your body weight to press your heel down over the edge of the step.  You should feel a stretch in your right / left calf.  Hold this position for __________ seconds.  Repeat this exercise with a slight bend in your right / left knee. Repeat __________ times. Complete this stretch __________ times per day.    STRENGTHENING EXERCISES - Plantar Fasciitis (Heel Spur Syndrome)  These exercises may help you when beginning to rehabilitate your injury. They may resolve your symptoms with or without further involvement from your physician, physical therapist or athletic trainer. While completing these exercises, remember:   Muscles can gain both the endurance and the strength needed for everyday activities through controlled exercises.  Complete these exercises as instructed by your physician, physical therapist or athletic trainer. Progress the resistance and repetitions only as guided. STRENGTH -  Towel Curls  Sit in a chair positioned on a non-carpeted surface.  Place your foot on a towel, keeping your heel on the floor.  Pull the towel toward your heel by only curling your toes. Keep your heel on the floor.  If instructed by your physician, physical therapist or athletic trainer, add ____________________ at the end of the towel. Repeat __________ times. Complete this exercise __________ times per day. STRENGTH - Ankle Inversion  Secure one end of a rubber exercise band/tubing to a fixed object (table, pole). Loop the other end around your foot just before your toes.  Place your fists between your knees. This will focus your strengthening at your ankle.  Slowly, pull your big toe up and in, making sure the band/tubing is positioned to resist the entire motion.  Hold this position for __________ seconds.  Have your muscles resist the band/tubing as it slowly pulls your foot back to the starting position. Repeat __________ times. Complete this exercises __________ times per day.  Document Released: 08/01/2005 Document Revised: 10/24/2011 Document Reviewed: 11/13/2008 [employer] Patient Information 2015 [employer]. This information is not intended to replace advice given to you by your health care provider. Make sure you discuss any questions you have with your health care provider.  

## 2014-09-16 NOTE — Progress Notes (Signed)
   Subjective:    Patient ID: Amy Watson, female    DOB: 1970-08-27, 44 y.o.   MRN: [id number]  HPI  44 year old female returns the office they with complaints of bilateral heel pain with a left greater than right. She states that she has pain on the bottom of her heels. She states that his been ongoing for "a couple of months". She states this first started off with pain just in the morning or after periods of rest but has become more of a consistent dull, throbbing pain on the bottom of her heel more recently. The pain does not wake her up at night. She denies any history of injury or trauma to the area and she denies any change in activity the time of onset of symptoms. She denies any swelling or redness overlying the area. No other complaints at this time.   Review of Systems  Constitutional: Positive for activity change.  Endocrine:       EXCESSIVE THIRST  Musculoskeletal: Positive for gait problem.       JOINT PAIN  Allergic/Immunologic: Positive for environmental allergies and food allergies.  Hematological: Bruises/bleeds easily.  All other systems reviewed and are negative.      Objective:   Physical Exam AAO x3, NAD DP/PT pulses palpable bilaterally, CRT less than 3 seconds Protective sensation intact with Simms Weinstein monofilament, vibratory sensation intact, Achilles tendon reflex intact Tenderness to palpation overlying the plantar medial tubercle of the calcaneus to bilateral heels at the insertion of the plantar fascia. There is no pain along the course of plantar fascial within the arch of the foot. There is no pain with lateral compression of the calcaneus or pain the vibratory sensation. No pain on the posterior aspect of the calcaneus or along the course/insertion of the Achilles tendon. There is no overlying edema, erythema, increase in warmth bilaterally. No other areas of tenderness palpation or pain with vibratory sensation to the foot/ankle. MMT 5/5, ROM  WNL No open lesions or pre-ulcerative lesions are identified. No pain with calf compression, swelling, warmth, erythema.     Assessment & Plan:  44 year old female with bilateral heel pain left greater than right, likely plantar fasciitis -X-rays were obtained and reviewed with the patient. -Treatment options were discussed with the patient including alternatives, risks, complications. -Patient elects to proceed with steroid injection into the left heel. Under sterile skin preparation, a total of 2.5cc of kenalog 10, 0.5% Marcaine plain, and 2% lidocaine plain were infiltrated into the symptomatic area without complication. A band-aid was applied. Patient tolerated the injection well without complication. Post-injection care with discussed with the patient. Discussed with the patient to ice the area over the next couple of days to help prevent a steroid flare. Patient wishes to hold off on steroid injection of the right side   as it is not as symptomatic -Dispensed plantar fascial braces 2. -Discussed ice -Discussed stretching exercises. -Discussed shoe gear modifications/possible orthotics. -Prescribed meloxicam. Discussed side effects the medication fitted to stop immediately should any occur call the office. -Follow-up in 3 weeks or sooner should any problems arise. In the meantime, encouraged to call the office with any questions, concerns, change in symptoms. Follow-up with PCP for other issues mentioned in the review of systems.

## 2014-09-19 ENCOUNTER — Ambulatory Visit: Payer: Self-pay | Admitting: Podiatry

## 2014-10-07 ENCOUNTER — Ambulatory Visit (INDEPENDENT_AMBULATORY_CARE_PROVIDER_SITE_OTHER): Payer: BLUE CROSS/BLUE SHIELD | Admitting: Podiatry

## 2014-10-07 VITALS — BP 119/74 | HR 92 | Resp 18

## 2014-10-07 DIAGNOSIS — M722 Plantar fascial fibromatosis: Secondary | ICD-10-CM

## 2014-10-07 MED ORDER — TRIAMCINOLONE ACETONIDE 10 MG/ML IJ SUSP
10.0000 mg | Freq: Once | INTRAMUSCULAR | Status: AC
  Administered 2014-10-07: 10 mg

## 2014-10-08 NOTE — Progress Notes (Signed)
Patient ID: Amy Watson Overall, female   DOB: [birth year], 44 y.o.   MRN: [id number]  Subjective: 44 year old female returns the office today for follow-up evaluation of left heel pain, likely plantar fasciitis. She states that since last appointment the area is much better however she discontinued has intermittent pain. She's been continuing the stretching icing activities. She is also been wearing the plantar fascial braces. She is requesting another steroid injection at this time. She denies any swelling or redness overlying the area. No other complaints at this time in no acute changes.  Objective: AAO x3, NAD DP/PT pulses palpable bilaterally, CRT less than 3 seconds Protective sensation intact with Simms Weinstein monofilament, vibratory sensation intact, Achilles tendon reflex intact Tenderness to palpation overlying the plantar medial tubercle of the calcaneus to left heel at the insertion of the plantar fascia. There is mild discomfort to the right, but not as significant as the right. There is no pain along the course of plantar fascial within the arch of the foot. The plantar fascia appears intact bilaterally. There is no pain with lateral compression of the calcaneus or pain the vibratory sensation. No pain on the posterior aspect of the calcaneus or along the course/insertion of the Achilles tendon bilaterally. There is no overlying edema, erythema, increase in warmth. No other areas of tenderness palpation or pain with vibratory sensation to the foot/ankle. MMT 5/5, ROM WNL No open lesions or pre-ulcerative lesions are identified. No pain with calf compression, swelling, warmth, erythema.  Assessment: 44 year old female with resolving heel pain, left worse than right  Plan: -Treatment options discussed including alternatives, risks, complications. -Patient elects to proceed with steroid injection into the left heel. Under sterile skin preparation, a total of 2.5cc of kenalog 10, 0.5%  Marcaine plain, and 2% lidocaine plain were infiltrated into the symptomatic area without complication. A band-aid was applied. Patient tolerated the injection well without complication. Post-injection care with discussed with the patient. Discussed with the patient to ice the area over the next couple of days to help prevent a steroid flare. Plantar fascial taping was applied. Once the tape is removed can continue of plantar fascial brace. Continue with stretching, icing activities. Continue anti-inflammatories. -Follow-up in 3 weeks or sooner if any problems are to arise. In the meantime, encouraged to call the office with any questions, concerns, change in symptoms.

## 2014-10-13 ENCOUNTER — Other Ambulatory Visit: Payer: Self-pay | Admitting: Podiatry

## 2014-10-28 ENCOUNTER — Ambulatory Visit: Payer: BLUE CROSS/BLUE SHIELD | Admitting: Podiatry

## 2015-01-19 ENCOUNTER — Ambulatory Visit: Payer: BLUE CROSS/BLUE SHIELD | Admitting: Podiatry

## 2015-01-20 ENCOUNTER — Ambulatory Visit (INDEPENDENT_AMBULATORY_CARE_PROVIDER_SITE_OTHER): Payer: BLUE CROSS/BLUE SHIELD | Admitting: Podiatry

## 2015-01-20 ENCOUNTER — Encounter: Payer: Self-pay | Admitting: Podiatry

## 2015-01-20 VITALS — BP 159/89 | HR 80 | Resp 17 | Ht 66.0 in | Wt 215.0 lb

## 2015-01-20 DIAGNOSIS — M7741 Metatarsalgia, right foot: Secondary | ICD-10-CM

## 2015-01-20 DIAGNOSIS — M779 Enthesopathy, unspecified: Secondary | ICD-10-CM

## 2015-01-20 DIAGNOSIS — L905 Scar conditions and fibrosis of skin: Secondary | ICD-10-CM

## 2015-01-21 ENCOUNTER — Other Ambulatory Visit: Payer: Self-pay | Admitting: Podiatry

## 2015-01-21 NOTE — Progress Notes (Signed)
Patient ID: Amy Watson Overall, female   DOB: 06-25-1971, 44 y.o.   MRN: [id number]  Subjective: 44 year old female presents the office today for pain to her right foot. She states that she started have pain overlying the area from where she had neuroma surgery several years ago. She states that she feels as if there scar tissue and the area causes discomfort. She points along the fourth interspace and the right foot with the majority of her pain is out. She also points the ball of the foot on the right foot which has become symptomatic over the last several months. She denies any history of injury or trauma. She denies any swelling or redness. She states that her heel pain is resolved. She has been massaging the area to help break up the scar tissue. No other treatments. Denies any systemic complaints such as fevers, chills, nausea, vomiting. No acute changes since last appointment, and no other complaints at this time.   Objective: AAO x3, NAD DP/PT pulses palpable bilaterally, CRT less than 3 seconds Protective sensation intact with Simms Weinstein monofilament, vibratory sensation intact, Achilles tendon reflex intact A scar seen on the dorsal aspect of the right fourth interspace from prior surgery. There is tenderness patient upon interspace directly over the scar. There is also tenderness along the plantar aspect of the metatarsal heads protruding the metatarsal heads 2, 3, 4. There is no tenderness along the metatarsal heads on the dorsal aspect. There is no pain the vibratory sensation along the metatarsals or digits. No pain with MTPJ range of motion. There is no overlying edema, erythema, increase in warmth. There is no palpable neuroma identified. There is no pain with meals lateral compression of the metatarsals. No other areas of pinpoint bony tenderness or pain with vibratory sensation. MMT 5/5, ROM WNL. No edema, erythema, increase in warmth to bilateral lower extremities.  No open lesions  or pre-ulcerative lesions.  No pain with calf compression, swelling, warmth, erythema  Assessment: 44 year old female right fourth interspace pain possible scar tissue, metatarsalgia  Plan: -All treatment options discussed with the patient including all alternatives, risks, complications.  -I discussed steroid injection of the right fourth interspace to help decrease inflammation, break up scar tissue help with her pain. Risks and complications of the injection were discussed the patient for which she verbally understood and consented. Under sterile conditions a total of 2 mL mixture Kenalog 10, 0.5% Marcaine plain, 2% lidocaine plain was infiltrated into the right fourth interspace. A Band-Aid was then applied. Postinjection care was discussed the patient. She tolerated injection well any complications. -Dispensed metatarsal offloading pads -Meloxicam refill. She had no side effects or problems take the medication before. -Follow-up in 4 weeks or sooner if any problems are to arise. -Patient encouraged to call the office with any questions, concerns, change in symptoms.

## 2015-02-05 ENCOUNTER — Ambulatory Visit (INDEPENDENT_AMBULATORY_CARE_PROVIDER_SITE_OTHER): Payer: BLUE CROSS/BLUE SHIELD | Admitting: Obstetrics & Gynecology

## 2015-02-05 VITALS — BP 141/84 | HR 76 | Ht 66.0 in | Wt 220.0 lb

## 2015-02-05 DIAGNOSIS — Z1151 Encounter for screening for human papillomavirus (HPV): Secondary | ICD-10-CM

## 2015-02-05 DIAGNOSIS — Z124 Encounter for screening for malignant neoplasm of cervix: Secondary | ICD-10-CM | POA: Diagnosis not present

## 2015-02-05 DIAGNOSIS — Z01419 Encounter for gynecological examination (general) (routine) without abnormal findings: Secondary | ICD-10-CM | POA: Diagnosis not present

## 2015-02-05 DIAGNOSIS — Z113 Encounter for screening for infections with a predominantly sexual mode of transmission: Secondary | ICD-10-CM | POA: Diagnosis not present

## 2015-02-05 DIAGNOSIS — N3946 Mixed incontinence: Secondary | ICD-10-CM

## 2015-02-05 DIAGNOSIS — Z0000 Encounter for general adult medical examination without abnormal findings: Secondary | ICD-10-CM

## 2015-02-05 LAB — CBC
HCT: 39.4 % (ref 36.0–46.0)
Hemoglobin: 13.3 g/dL (ref 12.0–15.0)
MCH: 30.9 pg (ref 26.0–34.0)
MCHC: 33.8 g/dL (ref 30.0–36.0)
MCV: 91.6 fL (ref 78.0–100.0)
MPV: 8.9 fL (ref 8.6–12.4)
PLATELETS: 266 10*3/uL (ref 150–400)
RBC: 4.3 MIL/uL (ref 3.87–5.11)
RDW: 13.1 % (ref 11.5–15.5)
WBC: 7.4 10*3/uL (ref 4.0–10.5)

## 2015-02-05 LAB — COMPREHENSIVE METABOLIC PANEL
ALBUMIN: 4.1 g/dL (ref 3.5–5.2)
ALK PHOS: 61 U/L (ref 39–117)
ALT: 19 U/L (ref 0–35)
AST: 14 U/L (ref 0–37)
BILIRUBIN TOTAL: 0.5 mg/dL (ref 0.2–1.2)
BUN: 12 mg/dL (ref 6–23)
CO2: 22 meq/L (ref 19–32)
Calcium: 8.8 mg/dL (ref 8.4–10.5)
Chloride: 107 mEq/L (ref 96–112)
Creat: 0.49 mg/dL — ABNORMAL LOW (ref 0.50–1.10)
GLUCOSE: 93 mg/dL (ref 70–99)
POTASSIUM: 4 meq/L (ref 3.5–5.3)
SODIUM: 140 meq/L (ref 135–145)
Total Protein: 6.7 g/dL (ref 6.0–8.3)

## 2015-02-05 LAB — LIPID PANEL
Cholesterol: 156 mg/dL (ref 0–200)
HDL: 53 mg/dL (ref >=46)
LDL Cholesterol: 87 mg/dL (ref 0–99)
Total CHOL/HDL Ratio: 2.9 ratio
Triglycerides: 82 mg/dL (ref <150)
VLDL: 16 mg/dL (ref 0–40)

## 2015-02-05 LAB — TSH: TSH: 1.277 u[IU]/mL (ref 0.350–4.500)

## 2015-02-05 NOTE — Addendum Note (Signed)
Addended by: Lin Landsman C on: 02/05/2015 10:14 AM   Modules accepted: Orders

## 2015-02-05 NOTE — Progress Notes (Signed)
Here today for yearly gyn exam and fasting labs.  Has questions about incontinence. Has had bronchitis issues since May.

## 2015-02-05 NOTE — Progress Notes (Signed)
Subjective:    Amy Watson is a 44 y.o. DW P2 (32 and 69 yo daughters)  female who presents for an annual exam. The patient has no complaints today except worsening GSUI. The patient is not currently sexually active. GYN screening history: last pap: was normal. The patient wears seatbelts: yes. The patient participates in regular exercise: no. Has the patient ever been transfused or tattooed?: yes. The patient reports that there is not domestic violence in her life.   Menstrual History: OB History    Gravida Para Term Preterm AB TAB SAB Ectopic Multiple Living   2 2        2       Menarche age: 63  No LMP recorded. Patient is not currently having periods (Reason: Perimenopausal).    The following portions of the patient's history were reviewed and updated as appropriate: allergies, current medications, past family history, past medical history, past social history, past surgical history and problem list.  Review of Systems A comprehensive review of systems was negative. She works 2 jobs.   Objective:    BP 141/84 mmHg  Pulse 76  Ht 5\' 6"  (1.676 m)  Wt 220 lb (99.791 kg)  BMI 35.53 kg/m2  General Appearance:    Alert, cooperative, no distress, appears stated age  Head:    Normocephalic, without obvious abnormality, atraumatic  Eyes:    PERRL, conjunctiva/corneas clear, EOM's intact, fundi    benign, both eyes  Ears:    Normal TM's and external ear canals, both ears  Nose:   Nares normal, septum midline, mucosa normal, no drainage    or sinus tenderness  Throat:   Lips, mucosa, and tongue normal; teeth and gums normal  Neck:   Supple, symmetrical, trachea midline, no adenopathy;    thyroid:  no enlargement/tenderness/nodules; no carotid   bruit or JVD  Back:     Symmetric, no curvature, ROM normal, no CVA tenderness  Lungs:     Clear to auscultation bilaterally, respirations unlabored  Chest Wall:    No tenderness or deformity   Heart:    Regular rate and rhythm, S1 and S2  normal, no murmur, rub   or gallop  Breast Exam:    No tenderness, masses, or nipple abnormality  Abdomen:     Soft, non-tender, bowel sounds active all four quadrants,    no masses, no organomegaly  Genitalia:    Normal female without lesion, discharge or tenderness, NSSA, NT, mobile, no adnexal masses     Extremities:   Extremities normal, atraumatic, no cyanosis or edema  Pulses:   2+ and symmetric all extremities  Skin:   Skin color, texture, turgor normal, no rashes or lesions  Lymph nodes:   Cervical, supraclavicular, and axillary nodes normal  Neurologic:   CNII-XII intact, normal strength, sensation and reflexes    throughout  .    Assessment:    Healthy female exam.   GSUI   Plan:     Breast self exam technique reviewed and patient encouraged to perform self-exam monthly. Thin prep Pap smear. Urology consult   Fasting labs Rec weight loss

## 2015-02-09 LAB — CYTOLOGY - PAP

## 2015-02-09 LAB — CERVICOVAGINAL ANCILLARY ONLY
BACTERIAL VAGINITIS: NEGATIVE
CHLAMYDIA, DNA PROBE: NEGATIVE
Candida vaginitis: NEGATIVE
Neisseria Gonorrhea: NEGATIVE
Trichomonas: NEGATIVE

## 2015-02-24 ENCOUNTER — Ambulatory Visit (INDEPENDENT_AMBULATORY_CARE_PROVIDER_SITE_OTHER): Payer: BLUE CROSS/BLUE SHIELD | Admitting: Podiatry

## 2015-02-24 DIAGNOSIS — M722 Plantar fascial fibromatosis: Secondary | ICD-10-CM | POA: Diagnosis not present

## 2015-02-24 DIAGNOSIS — M7741 Metatarsalgia, right foot: Secondary | ICD-10-CM | POA: Diagnosis not present

## 2015-02-24 NOTE — Patient Instructions (Signed)
Plantar Fasciitis (Heel Spur Syndrome) with Rehab The plantar fascia is a fibrous, ligament-like, soft-tissue structure that spans the bottom of the foot. Plantar fasciitis is a condition that causes pain in the foot due to inflammation of the tissue. SYMPTOMS   Pain and tenderness on the underneath side of the foot.  Pain that worsens with standing or walking. CAUSES  Plantar fasciitis is caused by irritation and injury to the plantar fascia on the underneath side of the foot. Common mechanisms of injury include:  Direct trauma to bottom of the foot.  Damage to a small nerve that runs under the foot where the main fascia attaches to the heel bone.  Stress placed on the plantar fascia due to bone spurs. RISK INCREASES WITH:   Activities that place stress on the plantar fascia (running, jumping, pivoting, or cutting).  Poor strength and flexibility.  Improperly fitted shoes.  Tight calf muscles.  Flat feet.  Failure to warm-up properly before activity.  Obesity. PREVENTION  Warm up and stretch properly before activity.  Allow for adequate recovery between workouts.  Maintain physical fitness:  Strength, flexibility, and endurance.  Cardiovascular fitness.  Maintain a health body weight.  Avoid stress on the plantar fascia.  Wear properly fitted shoes, including arch supports for individuals who have flat feet. PROGNOSIS  If treated properly, then the symptoms of plantar fasciitis usually resolve without surgery. However, occasionally surgery is necessary. RELATED COMPLICATIONS   Recurrent symptoms that may result in a chronic condition.  Problems of the lower back that are caused by compensating for the injury, such as limping.  Pain or weakness of the foot during push-off following surgery.  Chronic inflammation, scarring, and partial or complete fascia tear, occurring more often from repeated injections. TREATMENT  Treatment initially involves the use of  ice and medication to help reduce pain and inflammation. The use of strengthening and stretching exercises may help reduce pain with activity, especially stretches of the Achilles tendon. These exercises may be performed at home or with a therapist. Your caregiver may recommend that you use heel cups of arch supports to help reduce stress on the plantar fascia. Occasionally, corticosteroid injections are given to reduce inflammation. If symptoms persist for greater than 6 months despite non-surgical (conservative), then surgery may be recommended.  MEDICATION   If pain medication is necessary, then nonsteroidal anti-inflammatory medications, such as aspirin and ibuprofen, or other minor pain relievers, such as acetaminophen, are often recommended.  Do not take pain medication within 7 days before surgery.  Prescription pain relievers may be given if deemed necessary by your caregiver. Use only as directed and only as much as you need.  Corticosteroid injections may be given by your caregiver. These injections should be reserved for the most serious cases, because they may only be given a certain number of times. HEAT AND COLD  Cold treatment (icing) relieves pain and reduces inflammation. Cold treatment should be applied for 10 to 15 minutes every 2 to 3 hours for inflammation and pain and immediately after any activity that aggravates your symptoms. Use ice packs or massage the area with a piece of ice (ice massage).  Heat treatment may be used prior to performing the stretching and strengthening activities prescribed by your caregiver, physical therapist, or athletic trainer. Use a heat pack or soak the injury in warm water. SEEK IMMEDIATE MEDICAL CARE IF:  Treatment seems to offer no benefit, or the condition worsens.  Any medications produce adverse side effects. EXERCISES RANGE   OF MOTION (ROM) AND STRETCHING EXERCISES - Plantar Fasciitis (Heel Spur Syndrome) These exercises may help you  when beginning to rehabilitate your injury. Your symptoms may resolve with or without further involvement from your physician, physical therapist or athletic trainer. While completing these exercises, remember:   Restoring tissue flexibility helps normal motion to return to the joints. This allows healthier, less painful movement and activity.  An effective stretch should be held for at least 30 seconds.  A stretch should never be painful. You should only feel a gentle lengthening or release in the stretched tissue. RANGE OF MOTION - Toe Extension, Flexion  Sit with your right / left leg crossed over your opposite knee.  Grasp your toes and gently pull them back toward the top of your foot. You should feel a stretch on the bottom of your toes and/or foot.  Hold this stretch for __________ seconds.  Now, gently pull your toes toward the bottom of your foot. You should feel a stretch on the top of your toes and or foot.  Hold this stretch for __________ seconds. Repeat __________ times. Complete this stretch __________ times per day.  RANGE OF MOTION - Ankle Dorsiflexion, Active Assisted  Remove shoes and sit on a chair that is preferably not on a carpeted surface.  Place right / left foot under knee. Extend your opposite leg for support.  Keeping your heel down, slide your right / left foot back toward the chair until you feel a stretch at your ankle or calf. If you do not feel a stretch, slide your bottom forward to the edge of the chair, while still keeping your heel down.  Hold this stretch for __________ seconds. Repeat __________ times. Complete this stretch __________ times per day.  STRETCH - Gastroc, Standing  Place hands on wall.  Extend right / left leg, keeping the front knee somewhat bent.  Slightly point your toes inward on your back foot.  Keeping your right / left heel on the floor and your knee straight, shift your weight toward the wall, not allowing your back to  arch.  You should feel a gentle stretch in the right / left calf. Hold this position for __________ seconds. Repeat __________ times. Complete this stretch __________ times per day. STRETCH - Soleus, Standing  Place hands on wall.  Extend right / left leg, keeping the other knee somewhat bent.  Slightly point your toes inward on your back foot.  Keep your right / left heel on the floor, bend your back knee, and slightly shift your weight over the back leg so that you feel a gentle stretch deep in your back calf.  Hold this position for __________ seconds. Repeat __________ times. Complete this stretch __________ times per day. STRETCH - Gastrocsoleus, Standing  Note: This exercise can place a lot of stress on your foot and ankle. Please complete this exercise only if specifically instructed by your caregiver.   Place the ball of your right / left foot on a step, keeping your other foot firmly on the same step.  Hold on to the wall or a rail for balance.  Slowly lift your other foot, allowing your body weight to press your heel down over the edge of the step.  You should feel a stretch in your right / left calf.  Hold this position for __________ seconds.  Repeat this exercise with a slight bend in your right / left knee. Repeat __________ times. Complete this stretch __________ times per day.    STRENGTHENING EXERCISES - Plantar Fasciitis (Heel Spur Syndrome)  These exercises may help you when beginning to rehabilitate your injury. They may resolve your symptoms with or without further involvement from your physician, physical therapist or athletic trainer. While completing these exercises, remember:   Muscles can gain both the endurance and the strength needed for everyday activities through controlled exercises.  Complete these exercises as instructed by your physician, physical therapist or athletic trainer. Progress the resistance and repetitions only as guided. STRENGTH -  Towel Curls  Sit in a chair positioned on a non-carpeted surface.  Place your foot on a towel, keeping your heel on the floor.  Pull the towel toward your heel by only curling your toes. Keep your heel on the floor.  If instructed by your physician, physical therapist or athletic trainer, add ____________________ at the end of the towel. Repeat __________ times. Complete this exercise __________ times per day. STRENGTH - Ankle Inversion  Secure one end of a rubber exercise band/tubing to a fixed object (table, pole). Loop the other end around your foot just before your toes.  Place your fists between your knees. This will focus your strengthening at your ankle.  Slowly, pull your big toe up and in, making sure the band/tubing is positioned to resist the entire motion.  Hold this position for __________ seconds.  Have your muscles resist the band/tubing as it slowly pulls your foot back to the starting position. Repeat __________ times. Complete this exercises __________ times per day.  Document Released: 08/01/2005 Document Revised: 10/24/2011 Document Reviewed: 11/13/2008 [employer] Patient Information 2015 [employer]. This information is not intended to replace advice given to you by your health care provider. Make sure you discuss any questions you have with your health care provider.  

## 2015-02-26 NOTE — Progress Notes (Signed)
Patient ID: [name] Overall, female   DOB: 1971-06-24, 44 y.o.   MRN: [id number]  Subjective: 44 year old female presents the office for evaluation of right foot pain. She says the pain to her right foot is doing well and she has no pain to the area. She doesn't of the left with a she is starting some recurrence of the heel pain left side. She states that she has started her stretching activities and shoe like to have a taping performed the left foot. She denies any history of injury or trauma. Denies any tenderness. Denies any swelling or redness. This of her pain is in the morning and is relieved by his ambulation in her feet for quite some time. No other complaints at this time in no acute changes since last appointment.  Objective: AAO x3, NAD DP/PT pulses palpable bilaterally, CRT less than 3 seconds Protective sensation intact with Simms Weinstein monofilament, vibratory sensation intact, Achilles tendon reflex intact There is mild tenderness to palpation overlying the plantar medial tubercle of the calcaneus to left heel at the insertion of the plantar fascia. There is no pain along the course of plantar fascia within the arch of the foot. There is no pain with lateral compression of the calcaneus or pain the vibratory sensation. No pain on the posterior aspect of the calcaneus or along the course/insertion of the Achilles tendon. There is no overlying edema, erythema, increase in warmth. No other areas of tenderness palpation or pain with vibratory sensation to the foot/ankle. No pain over the right foot at this time. MMT 5/5, ROM WNL No open lesions or pre-ulcerative lesions are identified. No pain with calf compression, swelling, warmth, erythema.  Assessment: 44 year old female with resolved right foot pain, left heel pain, likely plantar fasciitis  Plan: -Treatment options discussed including all alternatives, risks, and complications -Discussed several injection to the left side  however she wishes to hold off at this time. The plantar fascial taping was applied. Continue stretching, ice and activity is on a consistent basis. Continue plantar fascial brace was a taping removed. -Continue to monitor for any recurrence of pain in the right foot. -Follow-up in 4 weeks if the symptoms are not resolved or sooner if any problems are to arise.  Celesta Gentile, DPM

## 2015-04-13 ENCOUNTER — Encounter: Payer: Self-pay | Admitting: Obstetrics & Gynecology

## 2015-05-19 ENCOUNTER — Telehealth: Payer: Self-pay | Admitting: *Deleted

## 2015-05-19 ENCOUNTER — Encounter: Payer: Self-pay | Admitting: Podiatry

## 2015-05-19 ENCOUNTER — Ambulatory Visit (INDEPENDENT_AMBULATORY_CARE_PROVIDER_SITE_OTHER): Payer: BLUE CROSS/BLUE SHIELD | Admitting: Podiatry

## 2015-05-19 VITALS — BP 146/81 | HR 84 | Resp 18

## 2015-05-19 DIAGNOSIS — D3610 Benign neoplasm of peripheral nerves and autonomic nervous system, unspecified: Secondary | ICD-10-CM

## 2015-05-19 DIAGNOSIS — G5761 Lesion of plantar nerve, right lower limb: Secondary | ICD-10-CM | POA: Diagnosis not present

## 2015-05-19 NOTE — Patient Instructions (Signed)
Morton's Neuroma in Sports  (Interdigital Plantar Neuroma) Morton's neuroma is a condition of the nervous system that results in pain or loss of feeling in the toes. The disease is caused by the bones of the foot squeezing the nerve that runs between two toes (interdigital nerve). The third and fourth toes are most likely to be affected by this disease. SYMPTOMS   Tingling, numbness, burning, or electric shocks in the front of the foot, often involving the third and fourth toes, although it may involve any other pair of toes.  Pain and tenderness in the front of the foot, that gets worse when walking.  Pain that gets worse when pressure is applied to the foot (wearing shoes).  Severe pain in the front of the foot, when standing on the front of the foot (on tiptoes), such as with running, jumping, pivoting, or dancing. CAUSES  Morton's neuroma is caused by swelling of the nerve between two toes. This swelling causes the nerve to be pinched between the bones of the foot. RISK INCREASES WITH:  Recurring foot or ankle injuries.  Poor fitting or worn shoes, with minimal padding and shock absorbers.  Loose ligaments of the foot, causing thickening of the nerve.  Poor foot strength and flexibility. PREVENTION  Warm up and stretch properly before activity.  Maintain physical fitness:  Foot and ankle flexibility.  Muscle strength and endurance.  Cardiovascular fitness.  Wear properly fitted and padded shoes.  Wear arch supports (orthotics), when needed. PROGNOSIS  If treated properly, Morton's neuroma can usually be cured with non-surgical treatment. For certain cases, surgery may be needed. RELATED COMPLICATIONS  Permanent numbness and pain in the foot.  Inability to participate in athletics, because of pain. TREATMENT Treatment first involves stopping any activities that make the symptoms worse. The use of ice and medicine will help reduce pain and inflammation. Wearing shoes  with a wide toe box, and an orthotic arch support or metatarsal bar, may also reduce pain. Your caregiver may give you a corticosteroid injection, to further reduce inflammation. If non-surgical treatment is unsuccessful, surgery may be needed. Surgery to fix Morton's neuroma is often performed as an outpatient procedure, meaning you can go home the same day as the surgery. The procedure involves removing the source of pressure on the nerve. If it is necessary to remove the nerve, you can expect persistent numbness. MEDICATION  If pain medicine is needed, nonsteroidal anti-inflammatory medicines (aspirin and ibuprofen), or other minor pain relievers (acetaminophen), are often advised.  Do not take pain medicine for 7 days before surgery.  Prescription pain relievers are usually prescribed only after surgery. Use only as directed and only as much as you need.  Corticosteroid injections are used in extreme cases, to reduce inflammation. These injections should be done only if necessary, because they may be given only a limited number of times. HEAT AND COLD  Cold treatment (icing) should be applied for 10 to 15 minutes every 2 to 3 hours for inflammation and pain, and immediately after activity that aggravates your symptoms. Use ice packs or an ice massage.  Heat treatment may be used before performing stretching and strengthening activities prescribed by your caregiver, physical therapist, or athletic trainer. Use a heat pack or a warm water soak. SEEK MEDICAL CARE IF:   Symptoms get worse or do not improve in 2 weeks, despite treatment.  After surgery you develop increasing pain, swelling, redness, increased warmth, bleeding, drainage of fluids, or fever.  New, unexplained symptoms develop. (  Drugs used in treatment may produce side effects.) Document Released: 06/08/2005 Document Revised: 10/24/2011 Document Reviewed: 11/13/2008 ExitCare Patient Information 2015 [employer]. This  information is not intended to replace advice given to you by your health care provider. Make sure you discuss any questions you have with your health care provider.  

## 2015-05-19 NOTE — Telephone Encounter (Addendum)
-----   Message from Trula Slade, DPM sent at 05/19/2015 12:02 PM EDT ----- Can you please order an ultrasound of the right foot 3rd interspace to look for a neuroma. She has a history of previous surgery. Thanks. Prior authorization began for [place] right foot [phone], dx neuroma D36.10 - referred to [employer] [phone] prior auth. svc - [place] states call [employer] - Eligibility and Benefits, called Eligibility and Benefits - Ulis Rias states the [phone] does not need prior authorization, reference # [id number].  Faxed to [phone].

## 2015-05-19 NOTE — Progress Notes (Signed)
Patient ID: [name] Overall, female   DOB: 14-Jan-1971, 44 y.o.   MRN: [id number]  Subjective:  44 year old female presents the office they for follow-up evaluation of right foot pain stated her pain has reoccurred. She is requesting a steroid injection in this area again. She says the pain started approximately a few weeks ago. At the point she had resolution of symptoms. She denies any recent injury or trauma. Denies any swelling or redness to the area. She states that her heel pain on the left side is much improved as well. Denies any pain to the area at this time. No other complaints at this time. No acute changes since last appointment.  Objective: AAO 3, NAD Neurovascular intact and unchanged There is tenderness patient along the third interspace of the right foot between the third and fourth toes. There is a scar over the area from prior surgery which is well-healed. Upon palpation in between the metatarsal heads plantarly there is a clicking sensation present. There is  Subjective numbnessto the third and fourth toes. There is no area pinpoint tenderness or pain the vibratory sensation. There is no overlying edema, erythema, increased warmth. On the left side there is currently no tenderness to palpation over the plantar medial tubercle of the calcaneus at the insertion of the plantar fascia. No pain the left foot. No open lesions or pre-ulcerative lesions. No calf compression, swelling, warmth, erythema.  Assessment:  44 year old female with slightly aroma right third interspace.   Plan:  I discussed both conservative and surgical treatment options. This time she still has insert injection into the area due to the pain. Under sterile conditions a total of 1.5 mL of a mixture Kenalog 10, 0.5% Marcaine plain and 2% lidocaine plain was infiltrated into the area of maximal tenderness on the right third interspace. Band-Aid was applied. Post instruction care was discussed the patient. She tolerated  the injection well any combination. Due to the continued recurrence of pain to interspace and on clinical exam it does appear to be neuroma I discussed with her an ultrasound to evaluate the area as she previously had  Surgery to that area as well. We will order the ultrasound.  Follow-up after this is sooner if any problems are to arise. Call any questions or concerns in the meantime.  Celesta Gentile, DPM

## 2015-05-19 NOTE — Telephone Encounter (Signed)
Faxed to [place]

## 2015-05-21 ENCOUNTER — Ambulatory Visit
Admission: RE | Admit: 2015-05-21 | Discharge: 2015-05-21 | Disposition: A | Discharge status: Home / Self Care | Payer: BLUE CROSS/BLUE SHIELD | Source: Ambulatory Visit | Attending: Podiatry | Admitting: Podiatry

## 2015-05-21 DIAGNOSIS — D3610 Benign neoplasm of peripheral nerves and autonomic nervous system, unspecified: Secondary | ICD-10-CM | POA: Insufficient documentation

## 2015-05-28 ENCOUNTER — Ambulatory Visit (INDEPENDENT_AMBULATORY_CARE_PROVIDER_SITE_OTHER): Payer: BLUE CROSS/BLUE SHIELD | Admitting: Podiatry

## 2015-05-28 ENCOUNTER — Encounter: Payer: Self-pay | Admitting: Podiatry

## 2015-05-28 VITALS — BP 151/93 | HR 92 | Resp 18

## 2015-05-28 DIAGNOSIS — D3610 Benign neoplasm of peripheral nerves and autonomic nervous system, unspecified: Secondary | ICD-10-CM

## 2015-05-28 NOTE — Progress Notes (Addendum)
BP 151/93 mmHg  Pulse 92  Resp 18   Subjective:    Patient ID: Amy Watson, female    DOB: [birth year], 44 y.o.   MRN: [id number]  HPI: Amy Watson is a 44 y.o. female presenting on 05/28/2015 for follow-up evaluation of right foot possible neuroma and to discuss ultrasound results. She states the injection at last appointment did help the top portion of her foot although she does continue to have pain in the bottom portion between the third and fourth toes. She says the majority of her pain is in the morning after being on her feet for long periods time. She gets numbness and tingling to the third and fourth toes. She denies any recent injury or trauma. No other complaints at this time in no acute changes.  Relevant past medical, surgical, family and social history reviewed and updated as indicated. Interim medical history since our last visit reviewed. Allergies and medications reviewed and updated.  Current Outpatient Prescriptions on File Prior to Visit  Medication Sig  . ALBUTEROL IN Inhale into the lungs.  Marland Kitchen amoxicillin (AMOXIL) 500 MG capsule   . benzonatate (TESSALON) 200 MG capsule   . cetirizine (ZYRTEC) 10 MG tablet Take by mouth.  . CHERATUSSIN AC 100-10 MG/5ML syrup   . DULERA 200-5 MCG/ACT AERO   . estradiol (ESTRACE) 0.5 MG tablet 1 mg.   . levalbuterol (XOPENEX HFA) 45 MCG/ACT inhaler Inhale into the lungs.  Marland Kitchen lisinopril-hydrochlorothiazide (PRINZIDE,ZESTORETIC) 20-12.5 MG per tablet Take by mouth.  . meloxicam (MOBIC) 7.5 MG tablet TAKE 1 TABLET (7.5 MG TOTAL) BY MOUTH DAILY. (Patient not taking: Reported on 02/05/2015)  . montelukast (SINGULAIR) 10 MG tablet   . montelukast (SINGULAIR) 10 MG tablet Take by mouth.  . progesterone (PROMETRIUM) 200 MG capsule   . traMADol (ULTRAM) 50 MG tablet   . venlafaxine (EFFEXOR) 37.5 MG tablet Take 1 tablet (37.5 mg total) by mouth 2 (two) times daily.  Marland Kitchen venlafaxine XR (EFFEXOR-XR) 37.5 MG 24 hr capsule TAKE 1  CAPSULE (37.5 MG TOTAL) BY MOUTH ONCE DAILY.   No current facility-administered medications on file prior to visit.    Review of systems: Per HPI unless specifically indicated above     Objective:    BP 151/93 mmHg  Pulse 92  Resp 18  Wt Readings from Last 3 Encounters:  02/05/15 220 lb (99.791 kg)  01/20/15 215 lb (97.523 kg)  01/15/14 220 lb 9.6 oz (100.064 kg)    Physical Exam General: AAO x3, NAD  Dermatological: Skin is warm, dry and supple bilateral. Nails x 10 are well manicured; remaining integument appears unremarkable at this time. There are no open sores, no preulcerative lesions, no rash or signs of infection present.  Vascular: Dorsalis Pedis artery and Posterior Tibial artery pedal pulses are 2/4 bilateral with immedate capillary fill time. Pedal hair growth present. No varicosities and no lower extremity edema present bilateral. There is no pain with calf compression, swelling, warmth, erythema.   Neruologic: Grossly intact via light touch bilateral. Vibratory intact via tuning fork bilateral. Protective threshold with Semmes Wienstein monofilament intact to all pedal sites bilateral. Patellar and Achilles deep tendon reflexes 2+ bilateral. No Babinski or clonus noted bilateral.   Musculoskeletal:  This continuation tenderness to palpation on the right foot on the third interspace. There is a clicking sensation present consistent with a neuroma. There is no area pinpoint bony tenderness or pain the vibratory sensation. No gross boney pedal deformities bilateral. No  pain, crepitus, or limitation noted with foot and ankle range of motion bilateral. Muscular strength 5/5 in all groups tested bilateral.  Gait: Unassisted, Nonantalgic.   Results for orders placed or performed in visit on 02/05/15  CBC  Result Value Ref Range   WBC 7.4 4.0 - 10.5 K/uL   RBC 4.30 3.87 - 5.11 MIL/uL   Hemoglobin 13.3 12.0 - 15.0 g/dL   HCT 39.4 36.0 - 46.0 %   MCV 91.6 78.0 - 100.0 fL     MCH 30.9 26.0 - 34.0 pg   MCHC 33.8 30.0 - 36.0 g/dL   RDW 13.1 11.5 - 15.5 %   Platelets 266 150 - 400 K/uL   MPV 8.9 8.6 - 12.4 fL  TSH  Result Value Ref Range   TSH 1.277 0.350 - 4.500 uIU/mL  Lipid panel  Result Value Ref Range   Cholesterol 156 0 - 200 mg/dL   Triglycerides 82 <150 mg/dL   HDL 53 >=46 mg/dL   Total CHOL/HDL Ratio 2.9 Ratio   VLDL 16 0 - 40 mg/dL   LDL Cholesterol 87 0 - 99 mg/dL  Comp Met (CMET)  Result Value Ref Range   Sodium 140 135 - 145 mEq/L   Potassium 4.0 3.5 - 5.3 mEq/L   Chloride 107 96 - 112 mEq/L   CO2 22 19 - 32 mEq/L   Glucose, Bld 93 70 - 99 mg/dL   BUN 12 6 - 23 mg/dL   Creat 0.49 (L) 0.50 - 1.10 mg/dL   Total Bilirubin 0.5 0.2 - 1.2 mg/dL   Alkaline Phosphatase 61 39 - 117 U/L   AST 14 0 - 37 U/L   ALT 19 0 - 35 U/L   Total Protein 6.7 6.0 - 8.3 g/dL   Albumin 4.1 3.5 - 5.2 g/dL   Calcium 8.8 8.4 - 10.5 mg/dL  Cervicovaginal ancillary only  Result Value Ref Range   Chlamydia Negative    Neisseria gonorrhea Negative    Trichomonas Negative   Cytology - PAP  Result Value Ref Range   CYTOLOGY - PAP PAP RESULT   Cervicovaginal ancillary only  Result Value Ref Range   Bacterial vaginitis Negative for Bacterial Vaginitis Microorganisms    Candida vaginitis Negative for Candida Vaginitis Microorganisms       Assessment & Plan:  44 y.o. female presents for neuroma right foot third interspace  -Treatment options discussed including all alternatives, risks, and complications. I discussed both conservative and surgical treatment options.  -She is requesting another steroid injection at this time to get the more plantar aspect of the foot. I discussed the risks and complications. Under sterile conditions a total of 1.5 mL of a mixture of Kenalog 10, 0.5% Marcaine plain and 2% lidocaine plain was infiltrated into the third interspace and the right foot with focus on the plantar aspect. She tolerated injection well any complications. Post  injection care was discussed with the patient.  -I discussed with her conservative and surgical treatment options. This time she is requesting surgical intervention to help decrease her pain given the ultrasound results of a neuroma. She would proceed with neurectomy.  -The incision placement as well as the postoperative course was discussed with the patient. I discussed risks of the surgery which include, but not limited to, infection, bleeding, pain, swelling, need for further surgery, delayed or nonhealing, painful or ugly scar, numbness or sensation changes, over/under correction, recurrence, transfer lesions, further deformity, hardware failure, DVT/PE, loss of toe/foot. Patient understands these  risks and wishes to proceed with surgery. The surgical consent was reviewed with the patient all 3 pages were signed. No promises or guarantees were given to the outcome of the procedure. All questions were answered to the best of my ability. Before the surgery the patient was encouraged to call the office if there is any further questions. The surgery will be performed at the [facility] on an outpatient basis. -Follow-up after surgery or sooner if any problems arise. In the meantime, encouraged to call the office with any questions, concerns, change in symptoms.   Celesta Gentile, DPM

## 2015-06-05 ENCOUNTER — Telehealth: Payer: Self-pay | Admitting: *Deleted

## 2015-06-05 NOTE — Telephone Encounter (Signed)
"  I'm hoping to get surgery done on 08/12/2015.  The last Wednesday in December.  I'm hoping to get that day for work purposes.  Thank you."  I'm returning your call.  December 28 is available for surgery with Dr. Jacqualyn Posey.  I'll go ahead and get you scheduled.  You can go ahead an register with [facility].  They will call you with the arrival time a day or two prior to surgery.  Do not eat or drink anything after midnight prior to surgery date.  "Okay, thank you."

## 2015-08-06 ENCOUNTER — Telehealth: Payer: Self-pay | Admitting: *Deleted

## 2015-08-06 NOTE — Telephone Encounter (Signed)
"  I had an appointment for surgery next Wednesday the 28th.  I'm going to have to reschedule next year when my flex plan is active.  Thanks so much.  I attempted to call patient.  Left a message to call if she wanted to reschedule appointment.  I called and left Caren Griffins a message at [facility] about the cancellation.

## 2015-08-20 ENCOUNTER — Encounter: Payer: Self-pay | Admitting: Podiatry

## 2015-08-21 ENCOUNTER — Encounter: Payer: Self-pay | Admitting: Podiatry

## 2016-01-27 ENCOUNTER — Telehealth: Payer: Self-pay | Admitting: Podiatry

## 2016-01-27 NOTE — Telephone Encounter (Signed)
Pt called in and stated that $50.00 was sent to collections and that was suppose to be resubmitted to insurance and she has a bill now of $10.00. She wants to know why she is being sent to collections if the claim was resubmitted

## 2016-02-23 ENCOUNTER — Encounter: Payer: Self-pay | Admitting: Obstetrics & Gynecology

## 2016-02-23 ENCOUNTER — Ambulatory Visit (INDEPENDENT_AMBULATORY_CARE_PROVIDER_SITE_OTHER): Payer: BLUE CROSS/BLUE SHIELD | Admitting: Obstetrics & Gynecology

## 2016-02-23 VITALS — BP 138/79 | HR 77 | Resp 18 | Ht 66.0 in | Wt 222.0 lb

## 2016-02-23 DIAGNOSIS — Z01419 Encounter for gynecological examination (general) (routine) without abnormal findings: Secondary | ICD-10-CM

## 2016-02-23 LAB — CBC
HEMATOCRIT: 42.8 % (ref 35.0–45.0)
HEMOGLOBIN: 14.3 g/dL (ref 11.7–15.5)
MCH: 30.6 pg (ref 27.0–33.0)
MCHC: 33.4 g/dL (ref 32.0–36.0)
MCV: 91.5 fL (ref 80.0–100.0)
MPV: 9.8 fL (ref 7.5–12.5)
Platelets: 246 10*3/uL (ref 140–400)
RBC: 4.68 MIL/uL (ref 3.80–5.10)
RDW: 13 % (ref 11.0–15.0)
WBC: 7.4 10*3/uL (ref 3.8–10.8)

## 2016-02-23 NOTE — Progress Notes (Signed)
Subjective:    Amy Watson is a 45 y.o. DW P2 (12 and 25 yo kids) female who presents for an annual exam. The patient has no complaints today. The patient is not currently sexually active. GYN screening history: last pap: was normal. The patient wears seatbelts: yes. The patient participates in regular exercise: no. Has the patient ever been transfused or tattooed?: yes. The patient reports that there is not domestic violence in her life.   Menstrual History: OB History    Gravida Para Term Preterm AB TAB SAB Ectopic Multiple Living   2 2        2       Menarche age: 53  No LMP recorded. Patient is not currently having periods (Reason: Perimenopausal).    The following portions of the patient's history were reviewed and updated as appropriate: allergies, current medications, past family history, past medical history, past social history, past surgical history and problem list.  Review of Systems Pertinent items are noted in HPI.  Mom with breast cancer prior to menopause. Works 2 jobs.    Objective:    BP 138/79 mmHg  Pulse 77  Resp 18  Ht 5\' 6"  (1.676 m)  Wt 222 lb (100.699 kg)  BMI 35.85 kg/m2  General Appearance:    Alert, cooperative, no distress, appears stated age  Head:    Normocephalic, without obvious abnormality, atraumatic  Eyes:    PERRL, conjunctiva/corneas clear, EOM's intact, fundi    benign, both eyes  Ears:    Normal TM's and external ear canals, both ears  Nose:   Nares normal, septum midline, mucosa normal, no drainage    or sinus tenderness  Throat:   Lips, mucosa, and tongue normal; teeth and gums normal  Neck:   Supple, symmetrical, trachea midline, no adenopathy;    thyroid:  no enlargement/tenderness/nodules; no carotid   bruit or JVD  Back:     Symmetric, no curvature, ROM normal, no CVA tenderness  Lungs:     Clear to auscultation bilaterally, respirations unlabored  Chest Wall:    No tenderness or deformity   Heart:    Regular rate and rhythm,  S1 and S2 normal, no murmur, rub   or gallop  Breast Exam:    No tenderness, masses, or nipple abnormality  Abdomen:     Soft, non-tender, bowel sounds active all four quadrants,    no masses, no organomegaly  Genitalia:    Normal female without lesion, discharge or tenderness, normal appearing cervix, NSSA, NT, mobile, no palpable adnexal masses     Extremities:   Extremities normal, atraumatic, no cyanosis or edema  Pulses:   2+ and symmetric all extremities  Skin:   Skin color, texture, turgor normal, no rashes or lesions  Lymph nodes:   Cervical, supraclavicular, and axillary nodes normal  Neurologic:   CNII-XII intact, normal strength, sensation and reflexes    throughout  .    Assessment:    Healthy female exam.    Plan:   mammogram

## 2016-02-24 LAB — LIPID PANEL
CHOL/HDL RATIO: 3.3 ratio (ref <=5.0)
Cholesterol: 144 mg/dL (ref 125–200)
HDL: 44 mg/dL — ABNORMAL LOW (ref >=46)
LDL CALC: 74 mg/dL (ref <130)
TRIGLYCERIDES: 129 mg/dL (ref <150)
VLDL: 26 mg/dL (ref <30)

## 2016-02-24 LAB — COMPREHENSIVE METABOLIC PANEL
ALBUMIN: 4.2 g/dL (ref 3.6–5.1)
ALT: 17 U/L (ref 6–29)
AST: 15 U/L (ref 10–35)
Alkaline Phosphatase: 58 U/L (ref 33–115)
BUN: 11 mg/dL (ref 7–25)
CALCIUM: 8.7 mg/dL (ref 8.6–10.2)
CHLORIDE: 106 mmol/L (ref 98–110)
CO2: 18 mmol/L — AB (ref 20–31)
CREATININE: 0.5 mg/dL (ref 0.50–1.10)
Glucose, Bld: 84 mg/dL (ref 65–99)
POTASSIUM: 4.2 mmol/L (ref 3.5–5.3)
SODIUM: 136 mmol/L (ref 135–146)
TOTAL PROTEIN: 6.6 g/dL (ref 6.1–8.1)
Total Bilirubin: 0.6 mg/dL (ref 0.2–1.2)

## 2016-02-24 LAB — TSH: TSH: 1.52 m[IU]/L

## 2016-02-24 LAB — VITAMIN D 25 HYDROXY (VIT D DEFICIENCY, FRACTURES): VIT D 25 HYDROXY: 29 ng/mL — AB (ref 30–100)

## 2016-02-26 ENCOUNTER — Telehealth: Payer: Self-pay | Admitting: *Deleted

## 2016-02-26 DIAGNOSIS — R7989 Other specified abnormal findings of blood chemistry: Secondary | ICD-10-CM

## 2016-02-26 MED ORDER — VITAMIN D (ERGOCALCIFEROL) 1.25 MG (50000 UNIT) PO CAPS: 50000.0000 [IU] | ORAL_CAPSULE | ORAL | Status: DC

## 2016-02-26 NOTE — Telephone Encounter (Signed)
Called pt informed her of result and sent Vitamin D rx to pharmacy per Dr Hulan Fray order.

## 2016-02-26 NOTE — Telephone Encounter (Signed)
-----   Message from Emily Filbert, MD sent at 02/26/2016 11:27 AM EDT ----- Please give her a script for Vitamin D 50, 000 units weekly for 8 weeks. Thanks

## 2016-03-03 ENCOUNTER — Ambulatory Visit: Payer: BLUE CROSS/BLUE SHIELD

## 2016-08-05 DIAGNOSIS — M9902 Segmental and somatic dysfunction of thoracic region: Secondary | ICD-10-CM | POA: Diagnosis not present

## 2016-08-05 DIAGNOSIS — M62830 Muscle spasm of back: Secondary | ICD-10-CM | POA: Diagnosis not present

## 2016-08-05 DIAGNOSIS — R51 Headache: Secondary | ICD-10-CM | POA: Diagnosis not present

## 2016-08-05 DIAGNOSIS — M9901 Segmental and somatic dysfunction of cervical region: Secondary | ICD-10-CM | POA: Diagnosis not present

## 2016-09-07 DIAGNOSIS — R05 Cough: Secondary | ICD-10-CM | POA: Diagnosis not present

## 2016-09-07 DIAGNOSIS — M654 Radial styloid tenosynovitis [de Quervain]: Secondary | ICD-10-CM | POA: Diagnosis not present

## 2016-09-07 DIAGNOSIS — R509 Fever, unspecified: Secondary | ICD-10-CM | POA: Diagnosis not present

## 2016-11-24 DIAGNOSIS — F419 Anxiety disorder, unspecified: Secondary | ICD-10-CM | POA: Diagnosis not present

## 2016-11-24 DIAGNOSIS — J4530 Mild persistent asthma, uncomplicated: Secondary | ICD-10-CM | POA: Diagnosis not present

## 2016-11-24 DIAGNOSIS — I10 Essential (primary) hypertension: Secondary | ICD-10-CM | POA: Diagnosis not present

## 2016-11-24 DIAGNOSIS — J301 Allergic rhinitis due to pollen: Secondary | ICD-10-CM | POA: Diagnosis not present

## 2016-12-02 DIAGNOSIS — M9902 Segmental and somatic dysfunction of thoracic region: Secondary | ICD-10-CM | POA: Diagnosis not present

## 2016-12-02 DIAGNOSIS — R51 Headache: Secondary | ICD-10-CM | POA: Diagnosis not present

## 2016-12-02 DIAGNOSIS — M62830 Muscle spasm of back: Secondary | ICD-10-CM | POA: Diagnosis not present

## 2016-12-02 DIAGNOSIS — M9901 Segmental and somatic dysfunction of cervical region: Secondary | ICD-10-CM | POA: Diagnosis not present

## 2016-12-28 DIAGNOSIS — J45998 Other asthma: Secondary | ICD-10-CM | POA: Diagnosis not present

## 2016-12-28 DIAGNOSIS — J45991 Cough variant asthma: Secondary | ICD-10-CM | POA: Diagnosis not present

## 2017-02-16 DIAGNOSIS — M9901 Segmental and somatic dysfunction of cervical region: Secondary | ICD-10-CM | POA: Diagnosis not present

## 2017-02-16 DIAGNOSIS — M62830 Muscle spasm of back: Secondary | ICD-10-CM | POA: Diagnosis not present

## 2017-02-16 DIAGNOSIS — M9902 Segmental and somatic dysfunction of thoracic region: Secondary | ICD-10-CM | POA: Diagnosis not present

## 2017-02-16 DIAGNOSIS — R51 Headache: Secondary | ICD-10-CM | POA: Diagnosis not present

## 2017-03-07 ENCOUNTER — Ambulatory Visit (INDEPENDENT_AMBULATORY_CARE_PROVIDER_SITE_OTHER): Payer: BLUE CROSS/BLUE SHIELD | Admitting: Obstetrics & Gynecology

## 2017-03-07 ENCOUNTER — Encounter: Payer: Self-pay | Admitting: Obstetrics & Gynecology

## 2017-03-07 VITALS — BP 138/82 | HR 89 | Wt 217.2 lb

## 2017-03-07 DIAGNOSIS — Z01419 Encounter for gynecological examination (general) (routine) without abnormal findings: Secondary | ICD-10-CM | POA: Diagnosis not present

## 2017-03-07 DIAGNOSIS — Z1151 Encounter for screening for human papillomavirus (HPV): Secondary | ICD-10-CM

## 2017-03-07 DIAGNOSIS — N950 Postmenopausal bleeding: Secondary | ICD-10-CM

## 2017-03-07 NOTE — Progress Notes (Signed)
Subjective:    Amy Watson is a 46 y.o. DW P2 (57 and 91 yo kids, both in college) female who presents for an annual exam. She has monthly spotting but tells me that she was tested at [facility] and told she was in menopause. She is on no HRT now but had been on HRT for years in the past. The patient is sexually active. GYN screening history: last pap: was normal. The patient wears seatbelts: yes. The patient participates in regular exercise: no. Has the patient ever been transfused or tattooed?: yes. The patient reports that there is not domestic violence in her life.   Menstrual History: OB History    Gravida Para Term Preterm AB Living   2 2       2    SAB TAB Ectopic Multiple Live Births           2      Menarche age: 63 No LMP recorded. Patient is not currently having periods (Reason: Perimenopausal).    The following portions of the patient's history were reviewed and updated as appropriate: allergies, current medications, past family history, past medical history, past social history, past surgical history and problem list.  Review of Systems Pertinent items are noted in HPI.   Monogamous, but rare sex, denies dyspareunia. 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, maintenance admin)    Objective:    BP 138/82   Pulse 89   Wt 217 lb 3.2 oz (98.5 kg)   BMI 35.06 kg/m   General Appearance:    Alert, cooperative, no distress, appears stated age  Head:    Normocephalic, without obvious abnormality, atraumatic  Eyes:    PERRL, conjunctiva/corneas clear, EOM's intact, fundi    benign, both eyes  Ears:    Normal TM's and external ear canals, both ears  Nose:   Nares normal, septum midline, mucosa normal, no drainage    or sinus tenderness  Throat:   Lips, mucosa, and tongue normal; teeth and gums normal  Neck:   Supple, symmetrical, trachea midline, no  adenopathy;    thyroid:  no enlargement/tenderness/nodules; no carotid   bruit or JVD  Back:     Symmetric, no curvature, ROM normal, no CVA tenderness  Lungs:     Clear to auscultation bilaterally, respirations unlabored  Chest Wall:    No tenderness or deformity   Heart:    Regular rate and rhythm, S1 and S2 normal, no murmur, rub   or gallop  Breast Exam:    No tenderness, masses, or nipple abnormality  Abdomen:     Soft, non-tender, bowel sounds active all four quadrants,    no masses, no organomegaly  Genitalia:    Normal female without lesion, discharge or tenderness     Extremities:   Extremities normal, atraumatic, no cyanosis or edema  Pulses:   2+ and symmetric all extremities  Skin:   Skin color, texture, turgor normal, no rashes or lesions  Lymph nodes:   Cervical, supraclavicular, and axillary nodes normal  Neurologic:   CNII-XII intact, normal strength, sensation and reflexes    throughout  .    Assessment:    Healthy female exam.   PMB versus light periods  Plan:     Thin prep Pap smear. with cotesting Mammogram Gyn u/s and [facility]

## 2017-03-08 LAB — COMPREHENSIVE METABOLIC PANEL
ALT: 29 IU/L (ref 0–32)
AST: 23 IU/L (ref 0–40)
Albumin/Globulin Ratio: 1.6 (ref 1.2–2.2)
Albumin: 4.7 g/dL (ref 3.5–5.5)
Alkaline Phosphatase: 62 IU/L (ref 39–117)
BILIRUBIN TOTAL: 0.5 mg/dL (ref 0.0–1.2)
BUN/Creatinine Ratio: 21 (ref 9–23)
BUN: 13 mg/dL (ref 6–24)
CHLORIDE: 102 mmol/L (ref 96–106)
CO2: 21 mmol/L (ref 20–29)
Calcium: 10 mg/dL (ref 8.7–10.2)
Creatinine, Ser: 0.62 mg/dL (ref 0.57–1.00)
GFR calc non Af Amer: 108 mL/min/{1.73_m2} (ref >59)
GFR, EST AFRICAN AMERICAN: 124 mL/min/{1.73_m2} (ref >59)
GLUCOSE: 111 mg/dL — AB (ref 65–99)
Globulin, Total: 2.9 g/dL (ref 1.5–4.5)
Potassium: 4.9 mmol/L (ref 3.5–5.2)
Sodium: 141 mmol/L (ref 134–144)
TOTAL PROTEIN: 7.6 g/dL (ref 6.0–8.5)

## 2017-03-08 LAB — LIPID PANEL
CHOL/HDL RATIO: 3.1 ratio (ref 0.0–4.4)
Cholesterol, Total: 150 mg/dL (ref 100–199)
HDL: 48 mg/dL (ref >39)
LDL Calculated: 87 mg/dL (ref 0–99)
TRIGLYCERIDES: 77 mg/dL (ref 0–149)
VLDL Cholesterol Cal: 15 mg/dL (ref 5–40)

## 2017-03-08 LAB — CBC
Hematocrit: 43.5 % (ref 34.0–46.6)
Hemoglobin: 14.8 g/dL (ref 11.1–15.9)
MCH: 30.8 pg (ref 26.6–33.0)
MCHC: 34 g/dL (ref 31.5–35.7)
MCV: 90 fL (ref 79–97)
PLATELETS: 265 10*3/uL (ref 150–379)
RBC: 4.81 x10E6/uL (ref 3.77–5.28)
RDW: 13.6 % (ref 12.3–15.4)
WBC: 7 10*3/uL (ref 3.4–10.8)

## 2017-03-08 LAB — TSH: TSH: 1.89 u[IU]/mL (ref 0.450–4.500)

## 2017-03-08 LAB — CYTOLOGY - PAP
Diagnosis: NEGATIVE
HPV (WINDOPATH): NOT DETECTED

## 2017-03-08 LAB — HEMOGLOBIN A1C
Est. average glucose Bld gHb Est-mCnc: 100 mg/dL
Hgb A1c MFr Bld: 5.1 % (ref 4.8–5.6)

## 2017-03-20 LAB — SPECIMEN STATUS REPORT

## 2017-03-20 LAB — FOLLICLE STIMULATING HORMONE

## 2017-03-24 ENCOUNTER — Other Ambulatory Visit: Payer: BLUE CROSS/BLUE SHIELD | Admitting: *Deleted

## 2017-03-24 ENCOUNTER — Encounter: Payer: Self-pay | Admitting: Obstetrics & Gynecology

## 2017-03-24 DIAGNOSIS — R232 Flushing: Secondary | ICD-10-CM

## 2017-03-24 NOTE — Progress Notes (Signed)
Pt should not have any charges for this encounter - redraw labs due to lab error

## 2017-03-25 LAB — FOLLICLE STIMULATING HORMONE: FSH: 8.6 m[IU]/mL

## 2017-03-28 DIAGNOSIS — M9901 Segmental and somatic dysfunction of cervical region: Secondary | ICD-10-CM | POA: Diagnosis not present

## 2017-03-28 DIAGNOSIS — M9902 Segmental and somatic dysfunction of thoracic region: Secondary | ICD-10-CM | POA: Diagnosis not present

## 2017-03-28 DIAGNOSIS — M62830 Muscle spasm of back: Secondary | ICD-10-CM | POA: Diagnosis not present

## 2017-03-28 DIAGNOSIS — R51 Headache: Secondary | ICD-10-CM | POA: Diagnosis not present

## 2017-04-12 ENCOUNTER — Encounter: Payer: Self-pay | Admitting: Obstetrics & Gynecology

## 2017-04-12 ENCOUNTER — Ambulatory Visit (INDEPENDENT_AMBULATORY_CARE_PROVIDER_SITE_OTHER): Payer: BLUE CROSS/BLUE SHIELD | Admitting: Obstetrics & Gynecology

## 2017-04-12 VITALS — BP 137/81 | HR 73 | Wt 212.0 lb

## 2017-04-12 DIAGNOSIS — Z30011 Encounter for initial prescription of contraceptive pills: Secondary | ICD-10-CM

## 2017-04-12 MED ORDER — NORGESTREL-ETHINYL ESTRADIOL 0.3-30 MG-MCG PO TABS: 1.0000 | ORAL_TABLET | Freq: Every day | ORAL | 11 refills | Status: DC

## 2017-04-12 NOTE — Progress Notes (Signed)
   Subjective:    Patient ID: Amy Watson, female    DOB: 1970/10/13, 46 y.o.   MRN: [id number]  HPI 46 yo DW P2 here for follow up after her [facility] showed her to be pre menopausal. She hd been told by people at [place] that she was post menopausal and has actually taken HRT.   Review of Systems     Objective:   Physical Exam Well nourished, well hydrated white female, no apparent distress Breathing, conversing, and ambulating normally       Assessment & Plan:  She desires permanent sterililty but doesn't have any time off from work until about Feb 2019. She wants to start OCPs until then RTC 4 weeks for BP check

## 2017-04-20 ENCOUNTER — Encounter: Payer: Self-pay | Admitting: Obstetrics & Gynecology

## 2017-05-01 DIAGNOSIS — M545 Low back pain: Secondary | ICD-10-CM | POA: Diagnosis not present

## 2017-05-18 DIAGNOSIS — J301 Allergic rhinitis due to pollen: Secondary | ICD-10-CM | POA: Diagnosis not present

## 2017-05-18 DIAGNOSIS — J3089 Other allergic rhinitis: Secondary | ICD-10-CM | POA: Diagnosis not present

## 2017-05-18 DIAGNOSIS — H1045 Other chronic allergic conjunctivitis: Secondary | ICD-10-CM | POA: Diagnosis not present

## 2017-05-18 DIAGNOSIS — J4530 Mild persistent asthma, uncomplicated: Secondary | ICD-10-CM | POA: Diagnosis not present

## 2017-06-02 DIAGNOSIS — R51 Headache: Secondary | ICD-10-CM | POA: Diagnosis not present

## 2017-06-02 DIAGNOSIS — M9902 Segmental and somatic dysfunction of thoracic region: Secondary | ICD-10-CM | POA: Diagnosis not present

## 2017-06-02 DIAGNOSIS — M62830 Muscle spasm of back: Secondary | ICD-10-CM | POA: Diagnosis not present

## 2017-06-02 DIAGNOSIS — M9901 Segmental and somatic dysfunction of cervical region: Secondary | ICD-10-CM | POA: Diagnosis not present

## 2017-06-19 ENCOUNTER — Encounter: Payer: Self-pay | Admitting: *Deleted

## 2017-06-19 ENCOUNTER — Emergency Department: Payer: BLUE CROSS/BLUE SHIELD

## 2017-06-19 ENCOUNTER — Other Ambulatory Visit: Payer: Self-pay

## 2017-06-19 ENCOUNTER — Emergency Department
Admission: EM | Admit: 2017-06-19 | Discharge: 2017-06-19 | Disposition: A | Discharge status: Home / Self Care | Payer: BLUE CROSS/BLUE SHIELD | Attending: Emergency Medicine | Admitting: Emergency Medicine

## 2017-06-19 DIAGNOSIS — J45909 Unspecified asthma, uncomplicated: Secondary | ICD-10-CM | POA: Diagnosis not present

## 2017-06-19 DIAGNOSIS — R0789 Other chest pain: Secondary | ICD-10-CM

## 2017-06-19 DIAGNOSIS — Z79899 Other long term (current) drug therapy: Secondary | ICD-10-CM | POA: Insufficient documentation

## 2017-06-19 DIAGNOSIS — R1013 Epigastric pain: Secondary | ICD-10-CM | POA: Diagnosis not present

## 2017-06-19 DIAGNOSIS — I10 Essential (primary) hypertension: Secondary | ICD-10-CM | POA: Insufficient documentation

## 2017-06-19 DIAGNOSIS — R079 Chest pain, unspecified: Secondary | ICD-10-CM | POA: Diagnosis not present

## 2017-06-19 LAB — BASIC METABOLIC PANEL
Anion gap: 9 (ref 5–15)
BUN: 11 mg/dL (ref 6–20)
CO2: 22 mmol/L (ref 22–32)
CREATININE: 0.53 mg/dL (ref 0.44–1.00)
Calcium: 9 mg/dL (ref 8.9–10.3)
Chloride: 104 mmol/L (ref 101–111)
GFR calc Af Amer: >60 mL/min (ref >60)
GLUCOSE: 134 mg/dL — AB (ref 65–99)
Potassium: 3.8 mmol/L (ref 3.5–5.1)
SODIUM: 135 mmol/L (ref 135–145)

## 2017-06-19 LAB — TROPONIN I: Troponin I: <0.03 ng/mL (ref <0.03)

## 2017-06-19 LAB — CBC
HCT: 41.4 % (ref 35.0–47.0)
Hemoglobin: 14.2 g/dL (ref 12.0–16.0)
MCH: 31.5 pg (ref 26.0–34.0)
MCHC: 34.4 g/dL (ref 32.0–36.0)
MCV: 91.5 fL (ref 80.0–100.0)
PLATELETS: 292 10*3/uL (ref 150–440)
RBC: 4.52 MIL/uL (ref 3.80–5.20)
RDW: 12.9 % (ref 11.5–14.5)
WBC: 11.8 10*3/uL — AB (ref 3.6–11.0)

## 2017-06-19 MED ORDER — OMEPRAZOLE MAGNESIUM 20 MG PO TBEC: 20.0000 mg | DELAYED_RELEASE_TABLET | Freq: Every day | ORAL | 1 refills | Status: DC

## 2017-06-19 MED ORDER — SUCRALFATE 1 G PO TABS: 1.0000 g | ORAL_TABLET | Freq: Four times a day (QID) | ORAL | 1 refills | Status: DC | PRN

## 2017-06-19 NOTE — Discharge Instructions (Signed)
You have been seen in the Emergency Department (ED) today for chest pain.  As we have discussed today?s test results are normal, and we think that it is most likely your symptoms are the result of a GI issue including possibly acid reflux or esophageal discomfort.  Try taking the prescribed occasion and/or an over-the-counter medicine such as Prilosec OTC.  If you decide to try both of them, discuss the appropriate timing of the medications with your pharmacist.  Please follow up with the recommended doctor as instructed above in these documents regarding today?s emergent visit and your recent symptoms to discuss further management.  Continue to take your regular medications.   Return to the Emergency Department (ED) if you experience any further chest pain/pressure/tightness, difficulty breathing, or sudden sweating, or other symptoms that concern you.

## 2017-06-19 NOTE — ED Triage Notes (Signed)
Pt complains of chest pressure starting yesterday, pt reports pulse was high at work, pt denies any other symptoms

## 2017-06-19 NOTE — ED Provider Notes (Signed)
[facility] Emergency Department Provider Note  ____________________________________________   First MD Initiated Contact with Patient 06/19/17 1419     (approximate)  I have reviewed the triage vital signs and the nursing notes.   HISTORY  Chief Complaint Chest Pain    HPI Amy Watson is a 46 y.o. female with medical history as listed below presents for evaluation of chest pressure which is been constant since yesterday.  She reports that when it first started she had a pulse rate in the 110s but that resolved quickly.  She had the mild discomfort when she went to bed and again when she woke up.  Nothing in particular makes it better or worse and she describes it as mild to moderate but she was encouraged by the office of her primary care doctor to come to the emergency department to make sure she is not having a heart attack.  She also reports that she feels something in her throat, a little bit like having a pill stuck, she is not having any trouble swallowing and not having any nausea or vomiting.  She denies fever/chills, lower abdominal pain, and dysuria.  She has a history of hypertension but no history of diabetes or hyperlipidemia.  She reports that she is currently not taking any birth control pills or exogenous estrogen even though birth control pills are listed on her prior home medications.  She has not been on any long trips or had any clots in her legs nor lungs.  She does not think she has a history of acid reflux and takes no medications  Past Medical History:  Diagnosis Date  . Allergy   . Asthma   . Hypertension   . Obesity     Patient Active Problem List   Diagnosis Date Noted  . Obesity   . Hot flashes     Past Surgical History:  Procedure Laterality Date  . NECK SURGERY      Prior to Admission medications   Medication Sig Start Date End Date Taking? Authorizing Provider  ALBUTEROL IN Inhale into the lungs.    [provider]  cetirizine (ZYRTEC) 10 MG tablet Take by mouth. Reported on 02/23/2016    [provider]  Fluticasone Furoate-Vilanterol (BREO ELLIPTA IN) Inhale into the lungs.    [provider]  lisinopril-hydrochlorothiazide (PRINZIDE,ZESTORETIC) 20-12.5 MG per tablet Take by mouth. 08/29/14 04/12/17  [provider]  montelukast (SINGULAIR) 10 MG tablet Take by mouth as needed.    [provider]  norgestrel-ethinyl estradiol (LO/OVRAL,CRYSELLE) 0.3-30 MG-MCG tablet Take 1 tablet by mouth daily. 04/12/17   Emily Filbert, MD  omeprazole (PRILOSEC OTC) 20 MG tablet Take 1 tablet (20 mg total) daily by mouth. 06/19/17 06/19/18  Hinda Kehr, MD  sucralfate (CARAFATE) 1 g tablet Take 1 tablet (1 g total) 4 (four) times daily as needed by mouth (for abdominal discomfort, nausea, and/or vomiting). 06/19/17   Hinda Kehr, MD    Allergies Azithromycin  Family History  Problem Relation Age of Onset  . Cancer Mother        breast  . Diabetes Father   . Hypertension Father   . Cancer Maternal Grandfather        skin    Social History Social History   Tobacco Use  . Smoking status: Never Smoker  . Smokeless tobacco: Never Used  Substance Use Topics  . Alcohol use: Yes    Comment: 1 per week  . Drug  use: No    Review of Systems Constitutional: No fever/chills Eyes: No visual changes. ENT: No sore throat.  Globus sensation without any difficulty swallowing Cardiovascular: chest pressure since yesterday Respiratory: Denies shortness of breath. Gastrointestinal: No abdominal pain.  No nausea, no vomiting.  No diarrhea.  No constipation. Genitourinary: Negative for dysuria. Musculoskeletal: Negative for neck pain.  Negative for Watson pain. Integumentary: Negative for rash. Neurological: Negative for headaches, focal weakness or numbness.   ____________________________________________   PHYSICAL EXAM:  VITAL SIGNS: ED Triage Vitals  Enc Vitals  Group     BP 06/19/17 1120 (!) 136/50     Pulse Rate 06/19/17 1120 90     Resp 06/19/17 1120 20     Temp 06/19/17 1120 98 F (36.7 C)     Temp Source 06/19/17 1120 Oral     SpO2 06/19/17 1120 98 %     Weight 06/19/17 1121 95.3 kg (210 lb)     Height 06/19/17 1121 1.676 m (5\' 6" )     Head Circumference --      Peak Flow --      Pain Score 06/19/17 1119 2     Pain Loc --      Pain Edu? --      Excl. in [place]? --     Constitutional: Alert and oriented. Well appearing and in no acute distress. Eyes: Conjunctivae are normal.  Head: Atraumatic. Nose: No congestion/rhinnorhea. Mouth/Throat: Mucous membranes are moist. Neck: No stridor.  No meningeal signs.   Cardiovascular: Normal rate, regular rhythm. Good peripheral circulation. Grossly normal heart sounds. Respiratory: Normal respiratory effort.  No retractions. Lungs CTAB. Gastrointestinal: Soft with mild epigastric tenderness, no lower abd tenderness.  Negative Murphy's sign and no tenderness in the right upper quadrant Musculoskeletal: No lower extremity tenderness nor edema. No gross deformities of extremities. Neurologic:  Normal speech and language. No gross focal neurologic deficits are appreciated.  Skin:  Skin is warm, dry and intact. No rash noted. Psychiatric: Mood and affect are normal. Speech and behavior are normal.  ____________________________________________   LABS (all labs ordered are listed, but only abnormal results are displayed)  Labs Reviewed  BASIC METABOLIC PANEL - Abnormal; Notable for the following components:      Result Value   Glucose, Bld 134 (*)    All other components within normal limits  CBC - Abnormal; Notable for the following components:   WBC 11.8 (*)    All other components within normal limits  TROPONIN I   ____________________________________________  EKG  ED ECG REPORT I, Vasily Fedewa, the attending physician, personally viewed and interpreted this ECG.  Date: 06/19/2017 EKG  Time: 11: 16 Rate: 100 Rhythm: borderline sinus tachycardia QRS Axis: normal Intervals: normal ST/T Wave abnormalities: normal Narrative Interpretation: no evidence of acute ischemia  ____________________________________________  RADIOLOGY   Dg Chest 2 View  Result Date: 06/19/2017 CLINICAL DATA:  Chest pain. EXAM: CHEST  2 VIEW COMPARISON:  Radiographs of June 28, 2011. FINDINGS: The heart size and mediastinal contours are within normal limits. Both lungs are clear. No pneumothorax or pleural effusion is noted. The visualized skeletal structures are unremarkable. IMPRESSION: No active cardiopulmonary disease. Electronically Signed   By: Marijo Conception, M.D.   On: 06/19/2017 11:52    ____________________________________________   PROCEDURES  Critical Care performed: No   Procedure(s) performed:   Procedures   ____________________________________________   INITIAL IMPRESSION / ASSESSMENT AND PLAN / ED COURSE  As part of my medical decision making,  I reviewed the following data within the [place] notes reviewed and incorporated, Labs reviewed , EKG interpreted , Radiograph reviewed     Differential diagnosis includes, but is not limited to, ACS, aortic dissection, pulmonary embolism, cardiac tamponade, pneumothorax, pneumonia, pericarditis, myocarditis, GI-related causes including esophagitis/gastritis, and musculoskeletal chest wall pain.  Low risk based on HEART score.       Pulmonary Embolism Rule-out Criteria (PERC rule)                        If YES to ANY of the following, the PERC rule is not satisfied and cannot be used to rule out PE in this patient (consider d-dimer or imaging depending on pre-test probability).                      If NO to ALL of the following, AND the clinician's pre-test probability is <15%, the [facility] rule is satisfied and there is no need for further workup (including no need to obtain a d-dimer) as the  post-test probability of pulmonary embolism is <2%.                      Mnemonic is HAD CLOTS   H - hormone use (exogenous estrogen)      No. A - age > 50                                                 No. D - DVT/PE history                                      No.   C - coughing blood (hemoptysis)                 No. L - leg swelling, unilateral                             No. O - O2 Sat on Room Air < 95%                  No. T - tachycardia (HR ? 100)                         No. S - surgery or trauma, recent                      No.   Based on my evaluation of the patient, including application of this decision instrument, further testing to evaluate for pulmonary embolism is not indicated at this time. I have discussed this recommendation with the patient who states understanding and agreement with this plan.  Based on the globus sensation in the tenderness of her epigastrium, I suspect this is esophageal/GI chest discomfort.  Discussed this with her in detail as well as the other life-threatening items on the differential and I explained why I do not feel she has an acute or emergent condition at this time.  I encouraged her to try both the prescribed Carafate and/or Prilosec OTC or similar PPI.   He will follow-up with her primary care provider at the  next available opportunity.  I gave my usual and customary return precautions and she understands and agrees with the plan.  Based on her symptoms and low risk and duration of symptoms, I do not feel she would benefit from a repeat troponin.    ____________________________________________  FINAL CLINICAL IMPRESSION(S) / ED DIAGNOSES  Final diagnoses:  Epigastric pain  Atypical chest pain     MEDICATIONS GIVEN DURING THIS VISIT:  Medications - No data to display     Note:  This document was prepared using Dragon voice recognition software and may include unintentional dictation errors.    Hinda Kehr, MD 06/19/17 [phone]

## 2017-06-19 NOTE — ED Notes (Signed)
AAOx3.  Skin warm and dry.  NAD 

## 2017-08-22 DIAGNOSIS — R51 Headache: Secondary | ICD-10-CM | POA: Diagnosis not present

## 2017-08-22 DIAGNOSIS — M9902 Segmental and somatic dysfunction of thoracic region: Secondary | ICD-10-CM | POA: Diagnosis not present

## 2017-08-22 DIAGNOSIS — M62830 Muscle spasm of back: Secondary | ICD-10-CM | POA: Diagnosis not present

## 2017-08-22 DIAGNOSIS — M9901 Segmental and somatic dysfunction of cervical region: Secondary | ICD-10-CM | POA: Diagnosis not present

## 2017-10-27 DIAGNOSIS — M9901 Segmental and somatic dysfunction of cervical region: Secondary | ICD-10-CM | POA: Diagnosis not present

## 2017-10-27 DIAGNOSIS — M9902 Segmental and somatic dysfunction of thoracic region: Secondary | ICD-10-CM | POA: Diagnosis not present

## 2017-10-27 DIAGNOSIS — R51 Headache: Secondary | ICD-10-CM | POA: Diagnosis not present

## 2017-10-27 DIAGNOSIS — M62830 Muscle spasm of back: Secondary | ICD-10-CM | POA: Diagnosis not present

## 2017-11-22 DIAGNOSIS — F419 Anxiety disorder, unspecified: Secondary | ICD-10-CM | POA: Diagnosis not present

## 2017-11-22 DIAGNOSIS — I10 Essential (primary) hypertension: Secondary | ICD-10-CM | POA: Diagnosis not present

## 2017-11-22 DIAGNOSIS — J301 Allergic rhinitis due to pollen: Secondary | ICD-10-CM | POA: Diagnosis not present

## 2017-11-22 DIAGNOSIS — J4530 Mild persistent asthma, uncomplicated: Secondary | ICD-10-CM | POA: Diagnosis not present

## 2017-12-27 ENCOUNTER — Encounter: Payer: Self-pay | Admitting: Radiology

## 2017-12-27 ENCOUNTER — Encounter: Payer: Self-pay | Admitting: Obstetrics & Gynecology

## 2018-01-03 DIAGNOSIS — J209 Acute bronchitis, unspecified: Secondary | ICD-10-CM | POA: Diagnosis not present

## 2018-01-31 DIAGNOSIS — J45901 Unspecified asthma with (acute) exacerbation: Secondary | ICD-10-CM | POA: Diagnosis not present

## 2018-05-24 DIAGNOSIS — J4530 Mild persistent asthma, uncomplicated: Secondary | ICD-10-CM | POA: Diagnosis not present

## 2018-05-24 DIAGNOSIS — J301 Allergic rhinitis due to pollen: Secondary | ICD-10-CM | POA: Diagnosis not present

## 2018-05-24 DIAGNOSIS — J3089 Other allergic rhinitis: Secondary | ICD-10-CM | POA: Diagnosis not present

## 2018-05-28 ENCOUNTER — Ambulatory Visit: Payer: BLUE CROSS/BLUE SHIELD | Admitting: Obstetrics & Gynecology

## 2018-05-28 DIAGNOSIS — F419 Anxiety disorder, unspecified: Secondary | ICD-10-CM | POA: Diagnosis not present

## 2018-05-28 DIAGNOSIS — I10 Essential (primary) hypertension: Secondary | ICD-10-CM | POA: Diagnosis not present

## 2018-05-28 DIAGNOSIS — J301 Allergic rhinitis due to pollen: Secondary | ICD-10-CM | POA: Diagnosis not present

## 2018-05-28 DIAGNOSIS — J4530 Mild persistent asthma, uncomplicated: Secondary | ICD-10-CM | POA: Diagnosis not present

## 2018-06-07 IMAGING — CR DG CHEST 2V
2 series · 2 of 2 positions shown · non-contrast
Comparison: Radiographs June 28, 2011.

CLINICAL DATA: Chest pain.

EXAM:
CHEST  2 VIEW

[chest pa]
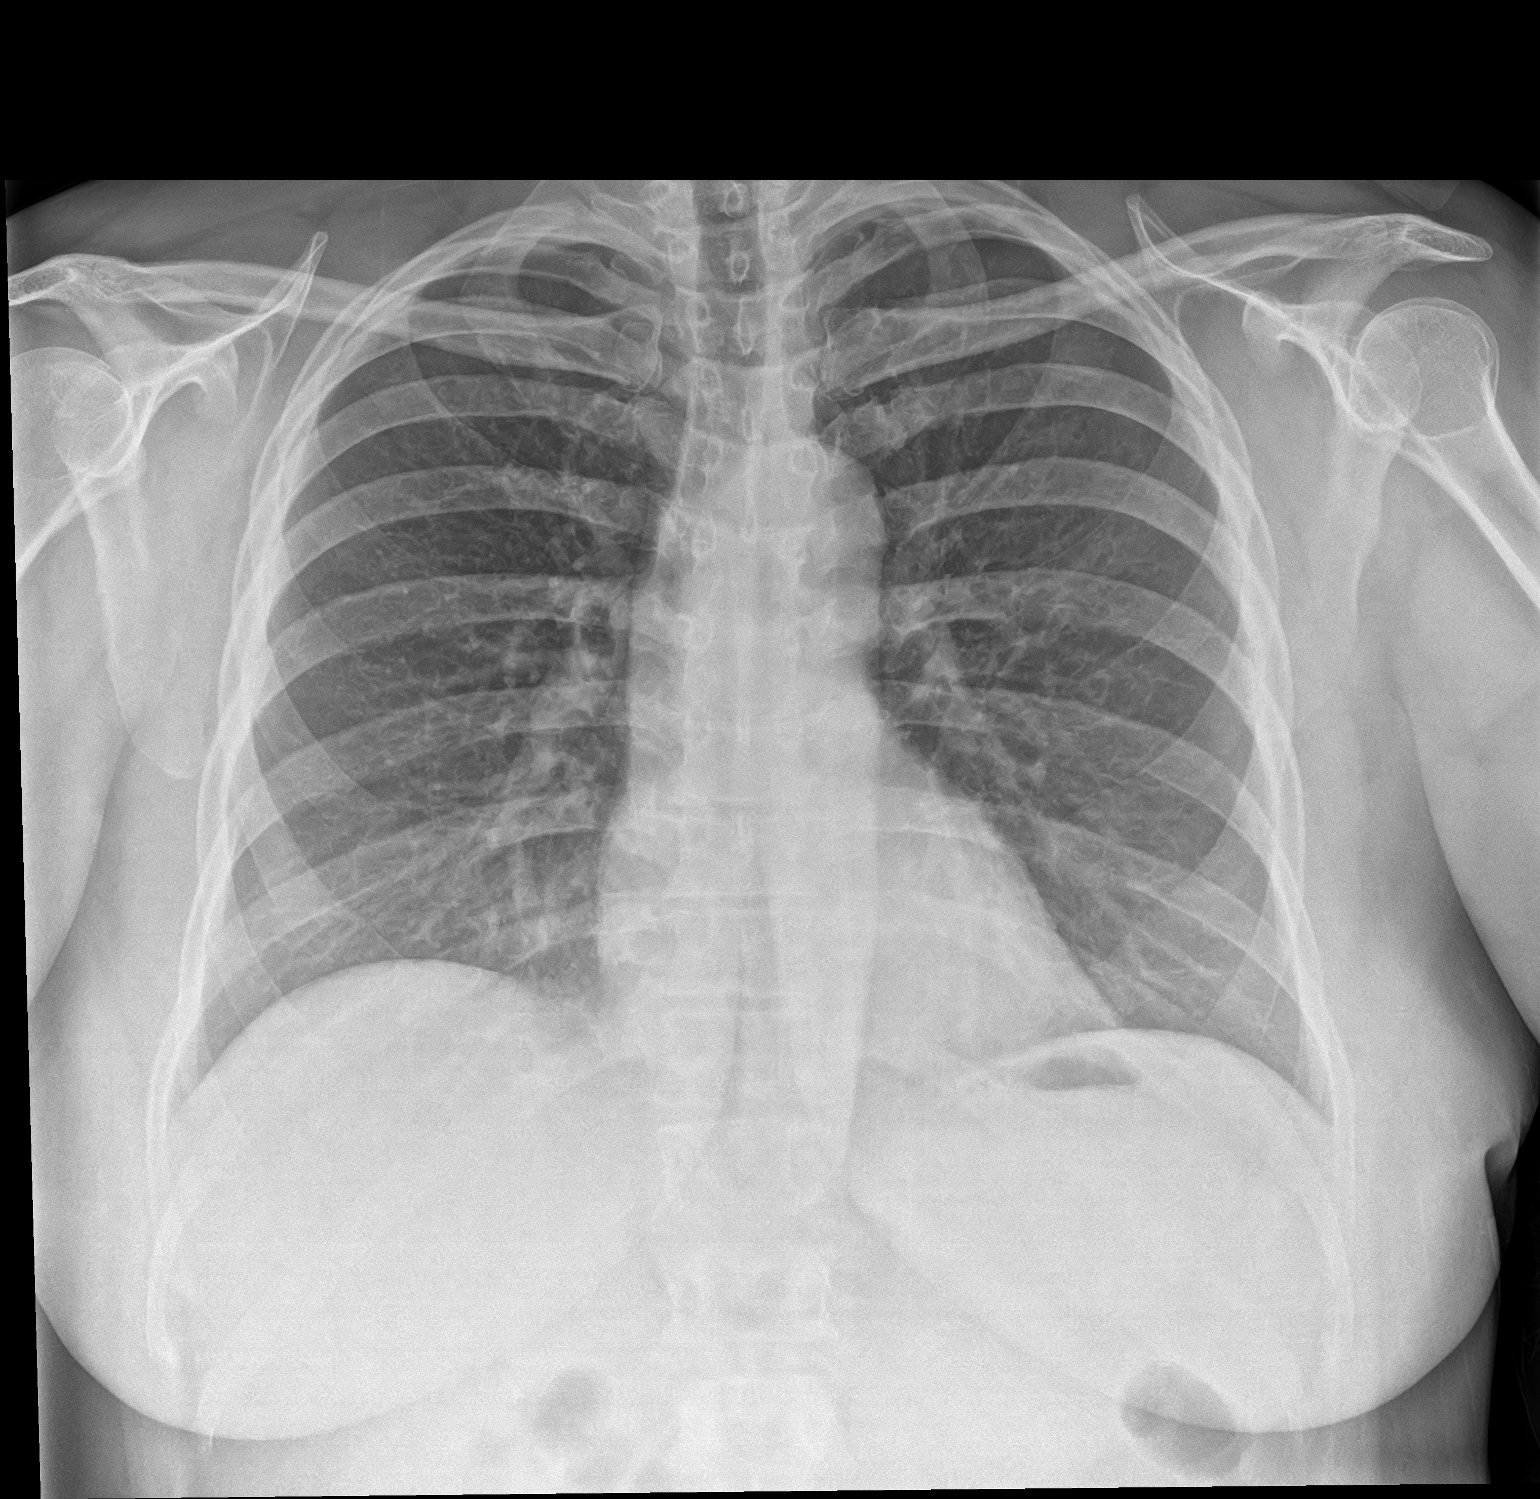

[chest lat]
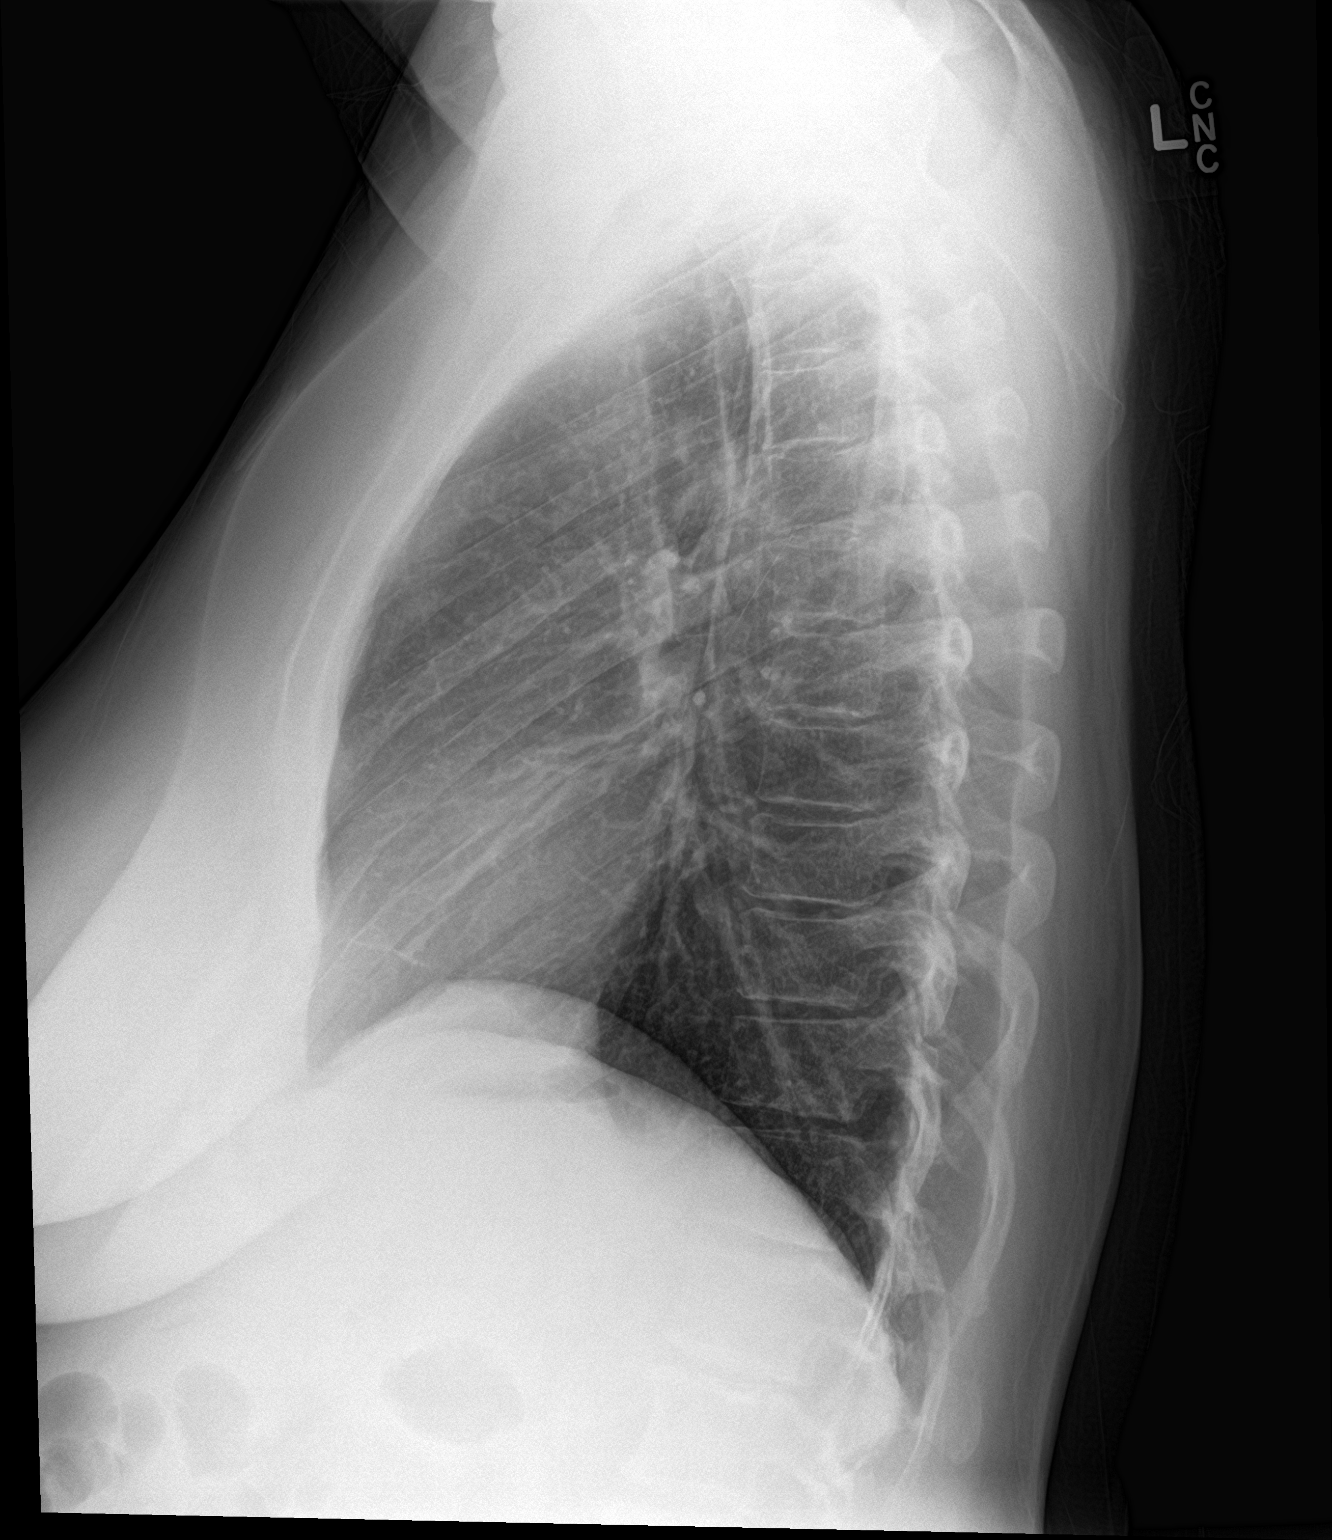

[2 of 2 positions shown; findings below may reference images not displayed]

FINDINGS: The heart size and mediastinal contours are within normal limits.
Both lungs are clear. No pneumothorax or pleural effusion is noted.
The visualized skeletal structures are unremarkable.
IMPRESSION: No active cardiopulmonary disease.

## 2018-06-11 DIAGNOSIS — R0602 Shortness of breath: Secondary | ICD-10-CM | POA: Insufficient documentation

## 2018-06-11 DIAGNOSIS — I10 Essential (primary) hypertension: Secondary | ICD-10-CM | POA: Diagnosis not present

## 2018-06-27 ENCOUNTER — Telehealth: Payer: Self-pay | Admitting: Radiology

## 2018-06-27 NOTE — Progress Notes (Signed)
Last pap 02/2017- normal  Mammogram, needs referral

## 2018-06-27 NOTE — Telephone Encounter (Signed)
Left message for patient to call [id number] to reschedule for appointment if not wanting to see a different provider other than Dr Hulan Fray who will not ne here the day of her appointment

## 2018-07-02 ENCOUNTER — Ambulatory Visit (INDEPENDENT_AMBULATORY_CARE_PROVIDER_SITE_OTHER): Payer: BLUE CROSS/BLUE SHIELD | Admitting: Nurse Practitioner

## 2018-07-02 ENCOUNTER — Encounter: Payer: Self-pay | Admitting: Nurse Practitioner

## 2018-07-02 VITALS — BP 133/83 | HR 82 | Ht 66.0 in | Wt 217.0 lb

## 2018-07-02 DIAGNOSIS — Z01419 Encounter for gynecological examination (general) (routine) without abnormal findings: Secondary | ICD-10-CM

## 2018-07-02 DIAGNOSIS — Z6841 Body Mass Index (BMI) 40.0 and over, adult: Secondary | ICD-10-CM | POA: Insufficient documentation

## 2018-07-02 DIAGNOSIS — Z0000 Encounter for general adult medical examination without abnormal findings: Secondary | ICD-10-CM | POA: Diagnosis not present

## 2018-07-02 NOTE — Progress Notes (Addendum)
GYNECOLOGY ANNUAL PREVENTATIVE CARE ENCOUNTER NOTE  Subjective:   Amy Watson is a 47 y.o. G2P2 female here for a routine annual gynecologic exam.  Current complaints: none - has menses spotting for 2 days every month.  Is not on birth control anymore - is not having intercourse.  Gets her labs done at her visit here as her PCP is in [place].  Today is fasting.  No intake in last 6 hours.   Denies abnormal vaginal bleeding, discharge, pelvic pain, problems with intercourse or other gynecologic concerns.   She has a stress test scheduled as her breathing is worse when she exercises.   Gynecologic History No LMP recorded. (Menstrual status: Perimenopausal). Contraception: none Last Pap: 02/2017. Results were: normal Last mammogram: has never had. - ordered today  Obstetric History OB History  Gravida Para Term Preterm AB Living  2 2       2   SAB TAB Ectopic Multiple Live Births          2    # Outcome Date GA Lbr Len/2nd Weight Sex Delivery Anes PTL Lv  2 Para         LIV  1 Para         LIV    Past Medical History:  Diagnosis Date  . Allergy   . Asthma   . Hypertension   . Obesity     Past Surgical History:  Procedure Laterality Date  . FOOT SURGERY    . NECK SURGERY      Current Outpatient Medications on File Prior to Visit  Medication Sig Dispense Refill  . ALBUTEROL IN Inhale into the lungs.    . cetirizine (ZYRTEC) 10 MG tablet Take by mouth. Reported on 02/23/2016    . Fluticasone Furoate-Vilanterol (BREO ELLIPTA IN) Inhale into the lungs.    . hydrochlorothiazide (HYDRODIURIL) 12.5 MG tablet Take 12.5 mg by mouth daily.    Marland Kitchen lisinopril (PRINIVIL,ZESTRIL) 20 MG tablet Take 20 mg by mouth daily.    . montelukast (SINGULAIR) 10 MG tablet Take by mouth as needed.    Marland Kitchen omeprazole (PRILOSEC OTC) 20 MG tablet Take 1 tablet (20 mg total) daily by mouth. 28 tablet 1   No current facility-administered medications on file prior to visit.     Allergies  Allergen  Reactions  . Azithromycin Other (See Comments)    C. Diff./  Pain     Social History   Socioeconomic History  . Marital status: Divorced    Spouse name: Not on file  . Number of children: Not on file  . Years of education: Not on file  . Highest education level: Not on file  Occupational History  . Not on file  Social Needs  . Financial resource strain: Not on file  . Food insecurity:    Worry: Not on file    Inability: Not on file  . Transportation needs:    Medical: Not on file    Non-medical: Not on file  Tobacco Use  . Smoking status: Never Smoker  . Smokeless tobacco: Never Used  Substance and Sexual Activity  . Alcohol use: Yes    Comment: 1 per week  . Drug use: No  . Sexual activity: Not Currently  Lifestyle  . Physical activity:    Days per week: Not on file    Minutes per session: Not on file  . Stress: Not on file  Relationships  . Social connections:    Talks on phone:  Not on file    Gets together: Not on file    Attends religious service: Not on file    Active member of club or organization: Not on file    Attends meetings of clubs or organizations: Not on file    Relationship status: Not on file  . Intimate partner violence:    Fear of current or ex partner: Not on file    Emotionally abused: Not on file    Physically abused: Not on file    Forced sexual activity: Not on file  Other Topics Concern  . Not on file  Social History Narrative  . Not on file    Family History  Problem Relation Age of Onset  . Cancer Mother        breast  . Diabetes Father   . Hypertension Father   . Cancer Maternal Grandfather        skin    The following portions of the patient's history were reviewed and updated as appropriate: allergies, current medications, past family history, past medical history, past social history, past surgical history and problem list.  Review of Systems Pertinent items noted in HPI and remainder of comprehensive ROS otherwise  negative.   Objective:  BP 133/83   Pulse 82   Ht 5\' 6"  (1.676 m)   Wt 217 lb (98.4 kg)   BMI 35.02 kg/m  CONSTITUTIONAL: Well-developed, well-nourished female in no acute distress.  HENT:  Normocephalic, atraumatic, External right and left ear normal.  EYES: Conjunctivae and EOM are normal. Pupils are equal, round.  No scleral icterus.  NECK: Normal range of motion, supple, no masses.  Normal thyroid.  SKIN: Skin is warm and dry. No rash noted. Not diaphoretic. No erythema. No pallor. NEUROLOGIC: Alert and oriented to person, place, and time. Normal reflexes, muscle tone coordination. No cranial nerve deficit noted. PSYCHIATRIC: Normal mood and affect. Normal behavior. Normal judgment and thought content. CARDIOVASCULAR: Normal heart rate noted, regular rhythm RESPIRATORY: Clear to auscultation bilaterally. Effort and breath sounds normal, no problems with respiration noted. BREASTS: Symmetric in size. No masses, skin changes, nipple drainage, or lymphadenopathy. ABDOMEN: Soft, no distention noted.  No tenderness, rebound or guarding.  MUSCULOSKELETAL: Normal range of motion. No tenderness.  No cyanosis, clubbing, or edema.    Patient Active Problem List   Diagnosis Date Noted  . Adult BMI 45.0-49.9 kg/sq m ([place]) 07/02/2018  . Obesity   . Hot flashes     Assessment and Plan:  1. Encounter for annual routine gynecological examination  - MM Digital Diagnostic Bilat; Future - CBC - Lipid Profile - Hemoglobin A1c - HIV antibody (with reflex)  2. Adult BMI 45.0-49.9 kg/sq m [facility]) Plans to have stress test as she has been hesitant to exercise due to breathing difficulties.  Pap due in 2 years. Mammogram ordered Routine preventative health maintenance measures emphasized. Please refer to After Visit Summary for other counseling recommendations.    Earlie Server, RN, MSN, NP-BC Nurse Practitioner, [place] [facility] [employer]

## 2018-07-03 LAB — CBC
Hematocrit: 39.2 % (ref 34.0–46.6)
Hemoglobin: 13.6 g/dL (ref 11.1–15.9)
MCH: 30.9 pg (ref 26.6–33.0)
MCHC: 34.7 g/dL (ref 31.5–35.7)
MCV: 89 fL (ref 79–97)
PLATELETS: 303 10*3/uL (ref 150–450)
RBC: 4.4 x10E6/uL (ref 3.77–5.28)
RDW: 12.5 % (ref 12.3–15.4)
WBC: 8.1 10*3/uL (ref 3.4–10.8)

## 2018-07-03 LAB — LIPID PANEL
CHOLESTEROL TOTAL: 139 mg/dL (ref 100–199)
Chol/HDL Ratio: 3 ratio (ref 0.0–4.4)
HDL: 47 mg/dL (ref >39)
LDL Calculated: 77 mg/dL (ref 0–99)
TRIGLYCERIDES: 73 mg/dL (ref 0–149)
VLDL CHOLESTEROL CAL: 15 mg/dL (ref 5–40)

## 2018-07-03 LAB — HEMOGLOBIN A1C
ESTIMATED AVERAGE GLUCOSE: 97 mg/dL
Hgb A1c MFr Bld: 5 % (ref 4.8–5.6)

## 2018-07-03 LAB — HIV ANTIBODY (ROUTINE TESTING W REFLEX): HIV SCREEN 4TH GENERATION: NONREACTIVE

## 2018-07-05 DIAGNOSIS — R0602 Shortness of breath: Secondary | ICD-10-CM | POA: Diagnosis not present

## 2018-07-05 DIAGNOSIS — I10 Essential (primary) hypertension: Secondary | ICD-10-CM | POA: Diagnosis not present

## 2018-08-13 DIAGNOSIS — F329 Major depressive disorder, single episode, unspecified: Secondary | ICD-10-CM | POA: Diagnosis not present

## 2018-08-13 DIAGNOSIS — J069 Acute upper respiratory infection, unspecified: Secondary | ICD-10-CM | POA: Diagnosis not present

## 2018-08-13 DIAGNOSIS — F419 Anxiety disorder, unspecified: Secondary | ICD-10-CM | POA: Diagnosis not present

## 2018-09-12 DIAGNOSIS — M791 Myalgia, unspecified site: Secondary | ICD-10-CM | POA: Diagnosis not present

## 2018-09-27 DIAGNOSIS — R768 Other specified abnormal immunological findings in serum: Secondary | ICD-10-CM | POA: Diagnosis not present

## 2018-09-27 DIAGNOSIS — Z13820 Encounter for screening for osteoporosis: Secondary | ICD-10-CM | POA: Diagnosis not present

## 2018-09-27 DIAGNOSIS — M791 Myalgia, unspecified site: Secondary | ICD-10-CM | POA: Diagnosis not present

## 2018-10-11 DIAGNOSIS — F419 Anxiety disorder, unspecified: Secondary | ICD-10-CM | POA: Diagnosis not present

## 2018-10-11 DIAGNOSIS — M791 Myalgia, unspecified site: Secondary | ICD-10-CM | POA: Diagnosis not present

## 2018-10-11 DIAGNOSIS — E559 Vitamin D deficiency, unspecified: Secondary | ICD-10-CM | POA: Diagnosis not present

## 2018-10-18 ENCOUNTER — Other Ambulatory Visit: Payer: Self-pay | Admitting: Nurse Practitioner

## 2018-10-18 DIAGNOSIS — Z01419 Encounter for gynecological examination (general) (routine) without abnormal findings: Secondary | ICD-10-CM

## 2018-10-26 ENCOUNTER — Ambulatory Visit
Admission: RE | Admit: 2018-10-26 | Discharge: 2018-10-26 | Disposition: A | Discharge status: Home / Self Care | Payer: BLUE CROSS/BLUE SHIELD | Source: Ambulatory Visit | Attending: Nurse Practitioner | Admitting: Nurse Practitioner

## 2018-10-26 ENCOUNTER — Other Ambulatory Visit: Payer: Self-pay

## 2018-10-26 DIAGNOSIS — Z1231 Encounter for screening mammogram for malignant neoplasm of breast: Secondary | ICD-10-CM | POA: Diagnosis not present

## 2018-10-26 DIAGNOSIS — Z01419 Encounter for gynecological examination (general) (routine) without abnormal findings: Secondary | ICD-10-CM | POA: Insufficient documentation

## 2018-11-29 DIAGNOSIS — E669 Obesity, unspecified: Secondary | ICD-10-CM | POA: Diagnosis not present

## 2018-11-29 DIAGNOSIS — I10 Essential (primary) hypertension: Secondary | ICD-10-CM | POA: Diagnosis not present

## 2018-11-29 DIAGNOSIS — F419 Anxiety disorder, unspecified: Secondary | ICD-10-CM | POA: Diagnosis not present

## 2018-11-29 DIAGNOSIS — J4530 Mild persistent asthma, uncomplicated: Secondary | ICD-10-CM | POA: Diagnosis not present

## 2019-04-10 DIAGNOSIS — M19041 Primary osteoarthritis, right hand: Secondary | ICD-10-CM | POA: Insufficient documentation

## 2019-04-10 DIAGNOSIS — M19042 Primary osteoarthritis, left hand: Secondary | ICD-10-CM | POA: Insufficient documentation

## 2019-04-16 ENCOUNTER — Ambulatory Visit: Payer: 59 | Admitting: Podiatry

## 2019-04-16 ENCOUNTER — Encounter: Payer: Self-pay | Admitting: Podiatry

## 2019-04-16 ENCOUNTER — Other Ambulatory Visit: Payer: Self-pay

## 2019-04-16 DIAGNOSIS — M722 Plantar fascial fibromatosis: Secondary | ICD-10-CM | POA: Diagnosis not present

## 2019-04-16 MED ORDER — METHYLPREDNISOLONE 4 MG PO TBPK: ORAL_TABLET | ORAL | 0 refills | Status: DC

## 2019-04-18 NOTE — Progress Notes (Signed)
   Subjective: 48 y.o. female presenting today as a new patient with a chief complaint of plantar fasciitis of the right foot that began flaring up three weeks ago. She states she was diagnosed with plantar fasciitis in 2016 by Dr. Jacqualyn Posey. She has had injections in the past which helped alleviate the symptoms. She has been taking Meloxicam which helps with the pain as well. Walking and being on the foot for long periods of time increases the pain. Patient is here for further evaluation and treatment.   Past Medical History:  Diagnosis Date  . Allergy   . Asthma   . Hypertension   . Obesity      Objective: Physical Exam General: The patient is alert and oriented x3 in no acute distress.  Dermatology: Skin is warm, dry and supple bilateral lower extremities. Negative for open lesions or macerations bilateral.   Vascular: Dorsalis Pedis and Posterior Tibial pulses palpable bilateral.  Capillary fill time is immediate to all digits.  Neurological: Epicritic and protective threshold intact bilateral.   Musculoskeletal: Tenderness to palpation to the plantar aspect of the right heel along the plantar fascia. All other joints range of motion within normal limits bilateral. Strength 5/5 in all groups bilateral.   Radiographic exam: Normal osseous mineralization. Joint spaces preserved. No fracture/dislocation/boney destruction. No other soft tissue abnormalities or radiopaque foreign bodies.   Assessment: 1. Plantar fasciitis right  Plan of Care:  1. Patient evaluated. Xrays reviewed.   2. Injection of 0.5cc Celestone soluspan injected into the right plantar fascia  3. Rx for Medrol Dose Pack placed. Then continue taking Meloxicam.  4. Return to clinic as needed.     Works two jobs at the moment.    Edrick Kins, DPM [facility]  Dr. Edrick Kins, DPM    [address], [place] [address]                Office  [phone]  Fax [phone]

## 2019-04-23 ENCOUNTER — Telehealth: Payer: Self-pay | Admitting: *Deleted

## 2019-04-23 NOTE — Telephone Encounter (Signed)
Pt states she was seen about a week ago 04/16/2019 and received an cortisone injection in the heel and it is still painful, and she would like to know how long it would take for her to have relief.

## 2019-04-23 NOTE — Telephone Encounter (Signed)
Returned call ,left message to call back.

## 2019-04-25 NOTE — Telephone Encounter (Signed)
I spoke with patient, she stated that the pain is not as sharp now, but feels like she is walking on a bruise.  She has finished the Medrol dose pack and started Meloxicam today.  I recommended for her to soak her feet in Epson salt twice daily, continue taking Meloxicam, ice and rest foot when she can and if no better in the next week, to call back for appt to be reevaluated.  She verbalized understanding and instructions

## 2019-05-24 ENCOUNTER — Encounter: Payer: Self-pay | Admitting: Podiatry

## 2019-05-24 ENCOUNTER — Other Ambulatory Visit: Payer: Self-pay

## 2019-05-24 ENCOUNTER — Ambulatory Visit (INDEPENDENT_AMBULATORY_CARE_PROVIDER_SITE_OTHER): Payer: 59 | Admitting: Podiatry

## 2019-05-24 DIAGNOSIS — M722 Plantar fascial fibromatosis: Secondary | ICD-10-CM

## 2019-05-27 NOTE — Progress Notes (Signed)
   Subjective: 48 y.o. female presenting today for follow up evaluation of plantar fasciitis of the right foot. She reports continued pain stating the injections did not help alleviate any pain. She has taken the Medrol Dose Pak and has been taking the Meloxicam for treatment. Being on the foot increases the pain. Patient is here for further evaluation and treatment.   Past Medical History:  Diagnosis Date  . Allergy   . Asthma   . Hypertension   . Obesity      Objective: Physical Exam General: The patient is alert and oriented x3 in no acute distress.  Dermatology: Skin is warm, dry and supple bilateral lower extremities. Negative for open lesions or macerations bilateral.   Vascular: Dorsalis Pedis and Posterior Tibial pulses palpable bilateral.  Capillary fill time is immediate to all digits.  Neurological: Epicritic and protective threshold intact bilateral.   Musculoskeletal: Tenderness to palpation to the plantar aspect of the right heel along the plantar fascia. All other joints range of motion within normal limits bilateral. Strength 5/5 in all groups bilateral.   Assessment: 1. Plantar fasciitis right  Plan of Care:  1. Patient evaluated.  2. Injection of 0.5cc Celestone soluspan injected into the right plantar fascia  3. CAM boot dispensed.  4. Night splint dispensed.  5. Continue taking Meloxicam daily.  6. Return to clinic in 4 weeks.     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    Edrick Kins, DPM [facility]  Dr. Edrick Kins, DPM    [address],  [address]                Office [phone]  Fax [phone]

## 2019-05-29 ENCOUNTER — Encounter: Payer: Self-pay | Admitting: Radiology

## 2019-06-06 ENCOUNTER — Other Ambulatory Visit: Payer: 59

## 2019-06-06 ENCOUNTER — Encounter: Payer: Self-pay | Admitting: Podiatry

## 2019-06-06 ENCOUNTER — Other Ambulatory Visit: Payer: Self-pay

## 2019-06-06 ENCOUNTER — Ambulatory Visit (INDEPENDENT_AMBULATORY_CARE_PROVIDER_SITE_OTHER): Payer: 59 | Admitting: Podiatry

## 2019-06-06 DIAGNOSIS — M7751 Other enthesopathy of right foot: Secondary | ICD-10-CM

## 2019-06-06 DIAGNOSIS — M719 Bursopathy, unspecified: Secondary | ICD-10-CM | POA: Insufficient documentation

## 2019-06-06 NOTE — Progress Notes (Signed)
This patient presents to the office stating that she is having pain and burning in her right forefoot for the last few days.  She says she has been diagnosed with plantar fasciitis and was treated with a cam walker.  She says that she is having severe pain in her forefoot wearing her cam walker.  She says she has been taking meloxicam and Tylenol with no benefit.  She presents the office for continued evaluation and treatment.  Objective.  Neurovascular status is intact .  Redness and pain noted sub 3 right foot.  Bursitis sub 3 right foot.  Padding applied to right forefoot as well as padding placed in cam walker.  RTC as scheduled.   Gardiner Barefoot DPM

## 2019-06-11 ENCOUNTER — Encounter: Payer: Self-pay | Admitting: Podiatry

## 2019-06-11 ENCOUNTER — Ambulatory Visit (INDEPENDENT_AMBULATORY_CARE_PROVIDER_SITE_OTHER): Payer: 59 | Admitting: Podiatry

## 2019-06-11 ENCOUNTER — Other Ambulatory Visit: Payer: Self-pay

## 2019-06-11 DIAGNOSIS — M722 Plantar fascial fibromatosis: Secondary | ICD-10-CM | POA: Diagnosis not present

## 2019-06-11 DIAGNOSIS — M629 Disorder of muscle, unspecified: Secondary | ICD-10-CM | POA: Diagnosis not present

## 2019-06-11 MED ORDER — TRAMADOL HCL 50 MG PO TABS: 50.0000 mg | ORAL_TABLET | Freq: Four times a day (QID) | ORAL | 0 refills | Status: AC | PRN

## 2019-06-13 NOTE — Progress Notes (Signed)
   Subjective: 48 y.o. female presenting today for follow up evaluation of plantar fasciitis of the right foot. She states she felt a pop in the right heel yesterday that resulted in immediate pain and swelling. She was not wearing the boot when the pain began. She states the symptoms have improved today but she is still having difficulty bearing weight. She has been taking Meloxicam as directed. Patient is here for further evaluation and treatment.   Past Medical History:  Diagnosis Date  . Allergy   . Asthma   . Hypertension   . Obesity      Objective: Physical Exam General: The patient is alert and oriented x3 in no acute distress.  Dermatology: Skin is warm, dry and supple bilateral lower extremities. Negative for open lesions or macerations bilateral.   Vascular: Dorsalis Pedis and Posterior Tibial pulses palpable bilateral.  Capillary fill time is immediate to all digits.  Neurological: Epicritic and protective threshold intact bilateral.   Musculoskeletal: Tenderness to palpation to the plantar aspect of the right heel along the plantar fascia. All other joints range of motion within normal limits bilateral. Strength 5/5 in all groups bilateral.   Assessment: 1. Plantar fasciitis right 2. Possible plantar fasciitis tear   Plan of Care:  1. Patient evaluated. 2. Injection of 0.5cc Celestone soluspan injected into the right plantar fascia  3. Continue taking Meloxicam.  4. Prescription for Tramadol provided to patient.  5. Continue using CAM boot for four weeks.  6. Return to clinic in 4 weeks.     Edrick Kins, DPM [facility]  Dr. Edrick Kins, DPM    [address], [place] [address]                Office [phone]  Fax [phone]

## 2019-06-18 ENCOUNTER — Encounter: Payer: Self-pay | Admitting: Podiatry

## 2019-06-19 ENCOUNTER — Encounter: Payer: Self-pay | Admitting: Podiatry

## 2019-06-21 ENCOUNTER — Ambulatory Visit: Payer: 59 | Admitting: Podiatry

## 2019-07-02 ENCOUNTER — Ambulatory Visit: Payer: 59 | Admitting: Podiatry

## 2019-07-10 ENCOUNTER — Other Ambulatory Visit: Payer: Self-pay

## 2019-07-10 ENCOUNTER — Ambulatory Visit (INDEPENDENT_AMBULATORY_CARE_PROVIDER_SITE_OTHER): Payer: 59 | Admitting: Advanced Practice Midwife

## 2019-07-10 ENCOUNTER — Encounter: Payer: Self-pay | Admitting: Advanced Practice Midwife

## 2019-07-10 VITALS — BP 120/81 | HR 97 | Wt 204.0 lb

## 2019-07-10 DIAGNOSIS — Z01419 Encounter for gynecological examination (general) (routine) without abnormal findings: Secondary | ICD-10-CM | POA: Diagnosis not present

## 2019-07-10 NOTE — Patient Instructions (Signed)

## 2019-07-10 NOTE — Progress Notes (Signed)
GYNECOLOGY ANNUAL PREVENTATIVE CARE ENCOUNTER NOTE  History:     Amy Watson is a 48 y.o. G2P2 female here for a routine annual gynecologic exam.  Current complaints: None.   Denies abnormal vaginal bleeding, discharge, pelvic pain, problems with intercourse or other gynecologic concerns.     Gynecologic History No LMP recorded. (Menstrual status: Perimenopausal). Contraception: perimenopausal Last Pap: 03/07/2017. Results were: normal with negative HPV Last mammogram: 10/2018. Results were: normal  Obstetric History OB History  Gravida Para Term Preterm AB Living  2 2       2   SAB TAB Ectopic Multiple Live Births          2    # Outcome Date GA Lbr Len/2nd Weight Sex Delivery Anes PTL Lv  2 Para         LIV  1 Para         LIV    Past Medical History:  Diagnosis Date  . Allergy   . Asthma   . Hypertension   . Obesity     Past Surgical History:  Procedure Laterality Date  . FOOT SURGERY    . NECK SURGERY      Current Outpatient Medications on File Prior to Visit  Medication Sig Dispense Refill  . Calcium Carb-Cholecalciferol (CALCIUM + D3) 600-200 MG-UNIT TABS Take 1 tablet by mouth 2 (two) times daily.    . cetirizine (ZYRTEC) 10 MG tablet Take by mouth. Reported on 02/23/2016    . Fluticasone Furoate-Vilanterol (BREO ELLIPTA IN) Inhale into the lungs.    . hydrochlorothiazide (HYDRODIURIL) 12.5 MG tablet Take 12.5 mg by mouth daily.    Marland Kitchen lisinopril (PRINIVIL,ZESTRIL) 20 MG tablet Take 20 mg by mouth daily.    . montelukast (SINGULAIR) 10 MG tablet Take by mouth as needed.    . naproxen sodium (ALEVE) 220 MG tablet Take by mouth.    . phentermine (ADIPEX-P) 37.5 MG tablet TAKE 1 TABLET BY MOUTH EVERY MORNING BEFORE BREAKFAST    . phentermine 15 MG capsule TAKE 1 CAPSULE (15 MG TOTAL) BY MOUTH EVERY MORNING BEFORE BREAKFAST FOR 30 DAYS    . PROAIR HFA 108 (90 Base) MCG/ACT inhaler TAKE 1 2 PUFFS AS NEEDED EVERY 4 6 HOURS AS NEEDED FOR COUGH/WHEEZE    .  topiramate (TOPAMAX) 25 MG tablet PLEASE SEE ATTACHED FOR DETAILED DIRECTIONS    . ALBUTEROL IN Inhale into the lungs.    Marland Kitchen FLUoxetine (PROZAC) 10 MG capsule Take by mouth daily.    . meloxicam (MOBIC) 7.5 MG tablet Take by mouth.    . methylPREDNISolone (MEDROL DOSEPAK) 4 MG TBPK tablet 6 day dose pack - take as directed (Patient not taking: Reported on 07/10/2019) 21 tablet 0  . omeprazole (PRILOSEC OTC) 20 MG tablet Take 1 tablet (20 mg total) daily by mouth. 28 tablet 1   No current facility-administered medications on file prior to visit.     Allergies  Allergen Reactions  . Azithromycin Other (See Comments)    C. Diff./  Pain     Social History:  reports that she has never smoked. She has never used smokeless tobacco. She reports current alcohol use. She reports that she does not use drugs.  Family History  Problem Relation Age of Onset  . Cancer Mother        breast  . Breast cancer Mother   . Diabetes Father   . Hypertension Father   . Cancer Maternal Grandfather  skin    The following portions of the patient's history were reviewed and updated as appropriate: allergies, current medications, past family history, past medical history, past social history, past surgical history and problem list.  Review of Systems Pertinent items noted in HPI and remainder of comprehensive ROS otherwise negative.  Physical Exam:  BP 120/81   Pulse 97   Wt 204 lb (92.5 kg)   BMI 32.93 kg/m  CONSTITUTIONAL: Well-developed, well-nourished female in no acute distress.  HENT:  Normocephalic, atraumatic, External right and left ear normal. Oropharynx is clear and moist EYES: Conjunctivae and EOM are normal. Pupils are equal, round, and reactive to light. No scleral icterus.  NECK: Normal range of motion, supple, no masses.  Normal thyroid.  SKIN: Skin is warm and dry. No rash noted. Not diaphoretic. No erythema. No pallor. MUSCULOSKELETAL: Normal range of motion. No tenderness.  No  cyanosis, clubbing, or edema.  2+ distal pulses. NEUROLOGIC: Alert and oriented to person, place, and time. Normal reflexes, muscle tone coordination.  PSYCHIATRIC: Normal mood and affect. Normal behavior. Normal judgment and thought content. CARDIOVASCULAR: Normal heart rate noted, regular rhythm RESPIRATORY: Clear to auscultation bilaterally. Effort and breath sounds normal, no problems with respiration noted. BREASTS: Symmetric in size. No masses, tenderness, skin changes, nipple drainage, or lymphadenopathy bilaterally. ABDOMEN: Soft, no distention noted.  No tenderness, rebound or guarding.  PELVIC: Normal appearing external genitalia and urethral meatus; normal appearing vaginal mucosa and cervix.  No abnormal discharge noted.  Pap smear obtained.  Normal uterine size, no other palpable masses, no uterine or adnexal tenderness.   Assessment and Plan:    1. Encounter for annual routine gynecological examination --No complaints or concerns --Reviewed guidelines for preventive care --Unable to perform well woman labs today due to lab person off sick  Pap deferred per patient request Mammogram scheduled Routine preventative health maintenance measures emphasized. Please refer to After Visit Summary for other counseling recommendations.      Total visit time 20 minutes. Greater than 50% of visit spent in counseling and coordination of care.  RTC at her earliest convenience for well woman labs. Well woman in 06/2020  Mallie Snooks, MSN, CNM Certified Nurse Midwife, [facility] for [employer], [place] 07/10/19 4:26 PM

## 2019-07-16 ENCOUNTER — Other Ambulatory Visit: Payer: Self-pay

## 2019-07-16 ENCOUNTER — Encounter: Payer: Self-pay | Admitting: Podiatry

## 2019-07-16 ENCOUNTER — Ambulatory Visit: Payer: 59 | Admitting: Podiatry

## 2019-07-16 DIAGNOSIS — M722 Plantar fascial fibromatosis: Secondary | ICD-10-CM | POA: Diagnosis not present

## 2019-07-16 DIAGNOSIS — S96911D Strain of unspecified muscle and tendon at ankle and foot level, right foot, subsequent encounter: Secondary | ICD-10-CM

## 2019-07-16 NOTE — Progress Notes (Signed)
   Subjective: 48 y.o. female presenting today for follow up evaluation of plantar fasciitis with plantar fascial rupture of the right foot. She states she felt a pop in the right heel on 06/10/2019 that resulted in immediate pain and swelling.  Last visit I gave her an anti-inflammatory steroid injection and provided immobilization in a cam boot that she has been wearing for the past 4 weeks.  She notes minimal improvement.  She says that the area is achy all the time.  Past Medical History:  Diagnosis Date  . Allergy   . Asthma   . Hypertension   . Obesity      Objective: Physical Exam General: The patient is alert and oriented x3 in no acute distress.  Dermatology: Skin is warm, dry and supple bilateral lower extremities. Negative for open lesions or macerations bilateral.   Vascular: Dorsalis Pedis and Posterior Tibial pulses palpable bilateral.  Capillary fill time is immediate to all digits.  Neurological: Epicritic and protective threshold intact bilateral.   Musculoskeletal: Tenderness to palpation to the plantar aspect of the right heel along the plantar fascia. All other joints range of motion within normal limits bilateral. Strength 5/5 in all groups bilateral.   Assessment: 1. Plantar fasciitis right 2. Possible plantar fasciitis tear   Plan of Care:  1. Patient evaluated. 2.  Continue wearing the immobilization cam boot  3.  Refill meloxicam 15 mg.  4.  Since there is no improvement and history of trauma with possible soft tissue damage and rupture we are going to order an MRI of the patient's right foot 5.  MRI order placed right foot to determine if we need to proceed with surgery 6.  Return to clinic in 4 weeks to review MRI results   Edrick Kins, DPM [facility]  Dr. Edrick Kins, DPM    [address], [place] [address]                Office [phone]  Fax [phone]

## 2019-07-17 ENCOUNTER — Other Ambulatory Visit: Payer: 59

## 2019-07-17 DIAGNOSIS — Z01419 Encounter for gynecological examination (general) (routine) without abnormal findings: Secondary | ICD-10-CM

## 2019-07-18 LAB — CBC
Hematocrit: 42.6 % (ref 34.0–46.6)
Hemoglobin: 14.5 g/dL (ref 11.1–15.9)
MCH: 31 pg (ref 26.6–33.0)
MCHC: 34 g/dL (ref 31.5–35.7)
MCV: 91 fL (ref 79–97)
Platelets: 308 10*3/uL (ref 150–450)
RBC: 4.67 x10E6/uL (ref 3.77–5.28)
RDW: 12.3 % (ref 11.7–15.4)
WBC: 8.6 10*3/uL (ref 3.4–10.8)

## 2019-07-18 LAB — COMPREHENSIVE METABOLIC PANEL
ALT: 17 IU/L (ref 0–32)
AST: 16 IU/L (ref 0–40)
Albumin/Globulin Ratio: 1.8 (ref 1.2–2.2)
Albumin: 4.8 g/dL (ref 3.8–4.8)
Alkaline Phosphatase: 70 IU/L (ref 39–117)
BUN/Creatinine Ratio: 17 (ref 9–23)
BUN: 12 mg/dL (ref 6–24)
Bilirubin Total: 0.4 mg/dL (ref 0.0–1.2)
CO2: 20 mmol/L (ref 20–29)
Calcium: 9.8 mg/dL (ref 8.7–10.2)
Chloride: 103 mmol/L (ref 96–106)
Creatinine, Ser: 0.69 mg/dL (ref 0.57–1.00)
GFR calc Af Amer: 118 mL/min/{1.73_m2} (ref >59)
GFR calc non Af Amer: 103 mL/min/{1.73_m2} (ref >59)
Globulin, Total: 2.6 g/dL (ref 1.5–4.5)
Glucose: 97 mg/dL (ref 65–99)
Potassium: 4.9 mmol/L (ref 3.5–5.2)
Sodium: 135 mmol/L (ref 134–144)
Total Protein: 7.4 g/dL (ref 6.0–8.5)

## 2019-07-18 LAB — LIPID PANEL
Chol/HDL Ratio: 3.4 ratio (ref 0.0–4.4)
Cholesterol, Total: 186 mg/dL (ref 100–199)
HDL: 54 mg/dL (ref >39)
LDL Chol Calc (NIH): 114 mg/dL — ABNORMAL HIGH (ref 0–99)
Triglycerides: 97 mg/dL (ref 0–149)
VLDL Cholesterol Cal: 18 mg/dL (ref 5–40)

## 2019-07-18 LAB — HEMOGLOBIN A1C
Est. average glucose Bld gHb Est-mCnc: 103 mg/dL
Hgb A1c MFr Bld: 5.2 % (ref 4.8–5.6)

## 2019-07-18 LAB — TSH: TSH: 1.98 u[IU]/mL (ref 0.450–4.500)

## 2019-07-19 ENCOUNTER — Telehealth: Payer: Self-pay

## 2019-07-19 DIAGNOSIS — M722 Plantar fascial fibromatosis: Secondary | ICD-10-CM

## 2019-07-19 DIAGNOSIS — S96911D Strain of unspecified muscle and tendon at ankle and foot level, right foot, subsequent encounter: Secondary | ICD-10-CM

## 2019-07-19 NOTE — Telephone Encounter (Signed)
-----   Message from Edrick Kins, DPM sent at 07/16/2019 11:24 AM EST ----- Regarding: MRI right foot Please order MRI right foot.   Dx: traumatic tendon rupture right foot  Thanks, Dr. Amalia [name]

## 2019-07-19 NOTE — Telephone Encounter (Signed)
MRI has been approved from 07/19/2019 to 09/02/2019 Auth # [id number]  Patient has been notified of approval and will call scheduling department to set up appt to her convenience

## 2019-07-22 ENCOUNTER — Telehealth: Payer: Self-pay | Admitting: Podiatry

## 2019-07-22 MED ORDER — MELOXICAM 15 MG PO TABS: 15.0000 mg | ORAL_TABLET | Freq: Every day | ORAL | 4 refills | Status: DC

## 2019-07-22 NOTE — Telephone Encounter (Signed)
Meloxicam has been sent to pharmacy

## 2019-07-22 NOTE — Telephone Encounter (Signed)
Pt was suppose to receive Meloxicam 15 mg when she was seen 07/16/2019. [facility]

## 2019-07-22 NOTE — Addendum Note (Signed)
Addended by: Graceann Congress D on: 07/22/2019 11:34 AM   Modules accepted: Orders

## 2019-07-30 ENCOUNTER — Encounter: Payer: Self-pay | Admitting: Podiatry

## 2019-07-30 ENCOUNTER — Telehealth: Payer: Self-pay | Admitting: *Deleted

## 2019-07-30 DIAGNOSIS — S96911D Strain of unspecified muscle and tendon at ankle and foot level, right foot, subsequent encounter: Secondary | ICD-10-CM

## 2019-07-30 DIAGNOSIS — M629 Disorder of muscle, unspecified: Secondary | ICD-10-CM

## 2019-07-30 DIAGNOSIS — M722 Plantar fascial fibromatosis: Secondary | ICD-10-CM

## 2019-07-30 NOTE — Telephone Encounter (Signed)
Pt states she cancelled her appt with [facility] on 07/31/2019 and her insurance said [facility] on [address] would be more cost effective for her, request change of orders to be faxed to [phone], and [facility] [phone]. Faxed new orders, clinicals and demographics to [facility].

## 2019-07-30 NOTE — Telephone Encounter (Signed)
Amy Watson states the procedure code [id number] would need to be reviewed by Carleene Overlie [phone], Carleene Overlie - Ms Hand changed the imaging location to [facility] [address] NPI:  [id number], AUTHORIZATION: [id number], VALID: 07/19/2019 - 09/02/2019, CALL REFERENCE: [id number] CENTRAL. FAXED required form, orders and authorization to [place].

## 2019-07-31 ENCOUNTER — Ambulatory Visit: Payer: 59

## 2019-07-31 NOTE — Telephone Encounter (Signed)
New order for MRI foot has been faxed to [facility]

## 2019-07-31 NOTE — Telephone Encounter (Signed)
This encounter was created in error - please disregard.

## 2019-08-20 ENCOUNTER — Encounter: Payer: Self-pay | Admitting: Podiatry

## 2019-08-20 ENCOUNTER — Other Ambulatory Visit: Payer: Self-pay

## 2019-08-20 ENCOUNTER — Ambulatory Visit: Payer: 59 | Admitting: Podiatry

## 2019-08-20 DIAGNOSIS — M722 Plantar fascial fibromatosis: Secondary | ICD-10-CM

## 2019-08-20 DIAGNOSIS — S96911D Strain of unspecified muscle and tendon at ankle and foot level, right foot, subsequent encounter: Secondary | ICD-10-CM

## 2019-08-20 NOTE — Patient Instructions (Signed)
Pre-Operative Instructions  Congratulations, you have decided to take an important step towards improving your quality of life.  You can be assured that the doctors and staff at [facility] will be with you every step of the way.  Here are some important things you should know:  1. Plan to be at the surgery center/hospital at least 1 (one) hour prior to your scheduled time, unless otherwise directed by the surgical center/hospital staff.  You must have a responsible adult accompany you, remain during the surgery and drive you home.  Make sure you have directions to the surgical center/hospital to ensure you arrive on time. 2. If you are having surgery at [facility] or [facility] hospitals, you will need a copy of your medical history and physical form from your family physician within one month prior to the date of surgery. We will give you a form for your primary physician to complete.  3. We make every effort to accommodate the date you request for surgery.  However, there are times where surgery dates or times have to be moved.  We will contact you as soon as possible if a change in schedule is required.   4. No aspirin/ibuprofen for one week before surgery.  If you are on aspirin, any non-steroidal anti-inflammatory medications (Mobic, Aleve, Ibuprofen) should not be taken seven (7) days prior to your surgery.  You make take Tylenol for pain prior to surgery.  5. Medications - If you are taking daily heart and blood pressure medications, seizure, reflux, allergy, asthma, anxiety, pain or diabetes medications, make sure you notify the surgery center/hospital before the day of surgery so they can tell you which medications you should take or avoid the day of surgery. 6. No food or drink after midnight the night before surgery unless directed otherwise by surgical center/hospital staff. 7. No alcoholic beverages 24-hours prior to surgery.  No smoking 24-hours prior or 24-hours after  surgery. 8. Wear loose pants or shorts. They should be loose enough to fit over bandages, boots, and casts. 9. Don't wear slip-on shoes. Sneakers are preferred. 10. Bring your boot with you to the surgery center/hospital.  Also bring crutches or a walker if your physician has prescribed it for you.  If you do not have this equipment, it will be provided for you after surgery. 11. If you have not been contacted by the surgery center/hospital by the day before your surgery, call to confirm the date and time of your surgery. 12. Leave-time from work may vary depending on the type of surgery you have.  Appropriate arrangements should be made prior to surgery with your employer. 13. Prescriptions will be provided immediately following surgery by your doctor.  Fill these as soon as possible after surgery and take the medication as directed. Pain medications will not be refilled on weekends and must be approved by the doctor. 14. Remove nail polish on the operative foot and avoid getting pedicures prior to surgery. 15. Wash the night before surgery.  The night before surgery wash the foot and leg well with water and the antibacterial soap provided. Be sure to pay special attention to beneath the toenails and in between the toes.  Wash for at least three (3) minutes. Rinse thoroughly with water and dry well with a towel.  Perform this wash unless told not to do so by your physician.  Enclosed: 1 Ice pack (please put in freezer the night before surgery)   1 Hibiclens skin cleaner     Pre-op instructions  If you have any questions regarding the instructions, please do not hesitate to call our office.  [place]: [address], [place], [place] [address] -- [phone]  : [address] , [place] [address] -- [phone]  [place]: [address], , [place] [address] -- [phone]   Website: [email or web address] 

## 2019-08-21 ENCOUNTER — Telehealth: Payer: Self-pay | Admitting: *Deleted

## 2019-08-21 NOTE — Telephone Encounter (Signed)
"  I saw Dr. Amalia [name] yesterday.  He told me to give you a call this afternoon to schedule my surgery."  I don't have your paperwork yet.  Can you tell me what you are having done?  "I'm having surgery on my Plantar Fascia.  He said it will take about 15 minutes."  Dr. Amalia [name] can do your surgery on September 26, 2019.  "What time will I need to arrive?  I'd like to be the first case."  Someone from the surgical center will call you a day or two prior to your surgery date and will give you your arrival time.  I cannot guarantee that you will be the first case.  They like to do children and diabetic patients first.  "I have to wait that long to find out, what if I have to arrange a ride.  Everybody will just be on standby for that day?"  You're welcome to call the surgery scheduler at the surgical center a few days prior to your surgery date to see if she can give you a time.  Her phone number is on the back of the brochure that we gave you.  (Please send me the paperwork.)

## 2019-08-22 ENCOUNTER — Encounter: Payer: Self-pay | Admitting: Podiatry

## 2019-08-22 NOTE — Progress Notes (Signed)
   Subjective: 49 y.o. female presenting today for follow up evaluation of right heel pain. She reports burning pain that is worsened by standing. She has been using the CAM boot and taking Meloxicam for treatment. Patient is here for further evaluation and treatment.    Past Medical History:  Diagnosis Date  . Allergy   . Asthma   . Hypertension   . Obesity      Objective: Physical Exam General: The patient is alert and oriented x3 in no acute distress.  Dermatology: Skin is warm, dry and supple bilateral lower extremities. Negative for open lesions or macerations bilateral.   Vascular: Dorsalis Pedis and Posterior Tibial pulses palpable bilateral.  Capillary fill time is immediate to all digits.  Neurological: Epicritic and protective threshold intact bilateral.   Musculoskeletal: Tenderness to palpation to the plantar aspect of the bilateral heels along the plantar fascia. All other joints range of motion within normal limits bilateral. Strength 5/5 in all groups bilateral.   MRI Impression from 08/13/2019:   1. Acute on chronic plantar fasciitis with high-grade partial tear of the plantar fascia.  2. Split tear of the peroneus brevis with moderate tenosynovitis.  3. Moderate tenosynovitis of the extensor digitorum tendons.  4. Mild posterior tibialis tenosynovitis.   Assessment: 1. Partial plantar fascial tear right 2. Plantar fasciitis left - midsubstance   Plan of Care:  1. Patient evaluated. MRI reviewed.   2. Injection of 0.5cc Celestone soluspan injected into the left heel.  3. Today we discussed the conservative versus surgical management of the presenting pathology. The patient opts for surgical management. All possible complications and details of the procedure were explained. All patient questions were answered. No guarantees were expressed or implied. 4. Authorization for surgery was initiated today. Surgery will consist of EPF right.  5. Return to clinic one  week post op.     Edrick Kins, DPM [facility]  Dr. Edrick Kins, DPM    [address],  [address]                Office [phone]  Fax [phone]

## 2019-08-26 ENCOUNTER — Encounter: Payer: Self-pay | Admitting: Podiatry

## 2019-08-27 ENCOUNTER — Encounter: Payer: Self-pay | Admitting: Podiatry

## 2019-08-28 ENCOUNTER — Encounter: Payer: Self-pay | Admitting: Podiatry

## 2019-09-03 ENCOUNTER — Encounter: Payer: Self-pay | Admitting: Podiatry

## 2019-09-10 ENCOUNTER — Telehealth: Payer: Self-pay | Admitting: *Deleted

## 2019-09-10 NOTE — Telephone Encounter (Signed)
DOS 09/26/2019 ENDOSCOPIC PLANTAR FASCIOTOMY RIGHT FOOT - 61683  [employer]: Eligibility Date - 12/14/2018 - 12/13/2019   Plan Deductible Per Service Year $7,290.21 of $3,000.00 Met  Remaining: $1,396.76  Out-of-Pocket Maximum Per Service Year $2,977.51 of $6,500.00 Met  Remaining: $3,522.49  Co-Insurance 11%     Pre-certification is REQUIRED  Status: PENDED  Reason: 1.Disposition pending review Tracking #: [id number]  Message: [phone]: [place] Representative will contact you

## 2019-09-11 NOTE — Telephone Encounter (Signed)
[facility] requested additional clinical notes as well as imaging results.  I uploaded the information to her case.  The notification/prior authorization case information was transmitted on 09/11/2019 at 5:42 PM CST. The notification/prior authorization reference number is [id number].

## 2019-09-12 NOTE — Telephone Encounter (Signed)
NOTIFICATION/PRIOR AUTHORIZATION NUMBER [id number]  COVERAGE STATUS OVERALL COVERAGE STATUS Covered/Approved 1-1 CODE DESCRIPTION COVERAGE STATUS DECISION DATE [facility] [place] [facility] Covered/Approved 09/12/2019 Coverage determination is reflected for the facility admission and is not a guarantee of payment for ongoing services.   1 [id number] Endoscopic plantar fasciotomy Covered/Approved 09/12/2019

## 2019-09-26 ENCOUNTER — Other Ambulatory Visit: Payer: Self-pay | Admitting: Podiatry

## 2019-09-26 ENCOUNTER — Encounter: Payer: Self-pay | Admitting: Podiatry

## 2019-09-26 DIAGNOSIS — M722 Plantar fascial fibromatosis: Secondary | ICD-10-CM | POA: Diagnosis not present

## 2019-09-26 MED ORDER — OXYCODONE-ACETAMINOPHEN 5-325 MG PO TABS: 1.0000 | ORAL_TABLET | Freq: Four times a day (QID) | ORAL | 0 refills | Status: DC | PRN

## 2019-09-26 NOTE — Progress Notes (Signed)
PRN postop 

## 2019-10-02 ENCOUNTER — Encounter: Payer: Self-pay | Admitting: Podiatry

## 2019-10-04 ENCOUNTER — Ambulatory Visit (INDEPENDENT_AMBULATORY_CARE_PROVIDER_SITE_OTHER): Payer: 59 | Admitting: Podiatry

## 2019-10-04 ENCOUNTER — Encounter: Payer: Self-pay | Admitting: *Deleted

## 2019-10-04 ENCOUNTER — Other Ambulatory Visit: Payer: Self-pay

## 2019-10-04 DIAGNOSIS — M722 Plantar fascial fibromatosis: Secondary | ICD-10-CM

## 2019-10-04 DIAGNOSIS — Z9889 Other specified postprocedural states: Secondary | ICD-10-CM

## 2019-10-07 NOTE — Progress Notes (Signed)
   Subjective:  Patient presents today status post EPF right. DOS: 09/26/2019. She states she is doing well. She denies any severe pain but reports soreness at the lateral incision. She has been using the CAM boot as directed. She denies modifying factors. Patient is here for further evaluation and treatment.    Past Medical History:  Diagnosis Date  . Allergy   . Asthma   . Hypertension   . Obesity       Objective/Physical Exam Neurovascular status intact.  Skin incisions appear to be well coapted with sutures and staples intact. No sign of infectious process noted. No dehiscence. No active bleeding noted. Moderate edema noted to the surgical extremity.  Assessment: 1. s/p EPF right. DOS: 09/26/2019   Plan of Care:  1. Patient was evaluated.  2. Dressing changed.  3. Continue weightbearing in CAM boot.  4. Return to clinic in one week for suture removal.   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    Edrick Kins, DPM [facility]  Dr. Edrick Kins, [place]                                        [place], [place] [address]                Office [phone]  Fax [phone]

## 2019-10-11 ENCOUNTER — Encounter: Payer: 59 | Admitting: Podiatry

## 2019-10-14 IMAGING — MG DIGITAL SCREENING BILATERAL MAMMOGRAM WITH TOMO AND CAD
8 series · 8 of 24 positions shown · non-contrast
Comparison: Previous exam(s).

CLINICAL DATA: Screening.

EXAM:
DIGITAL SCREENING BILATERAL MAMMOGRAM WITH TOMO AND CAD

[L MLO synth-2D]
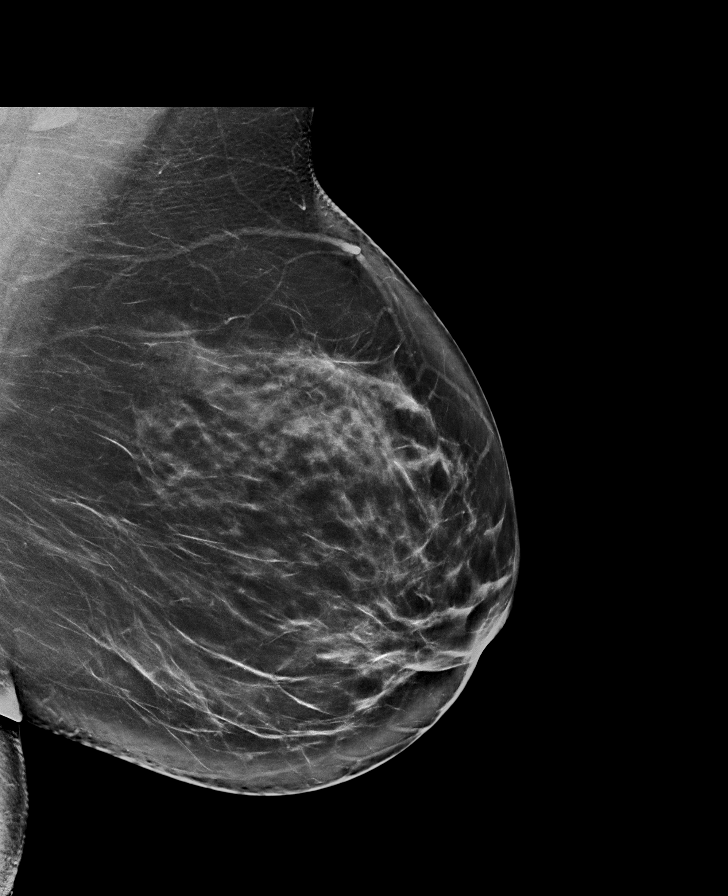

[R CC synth-2D]
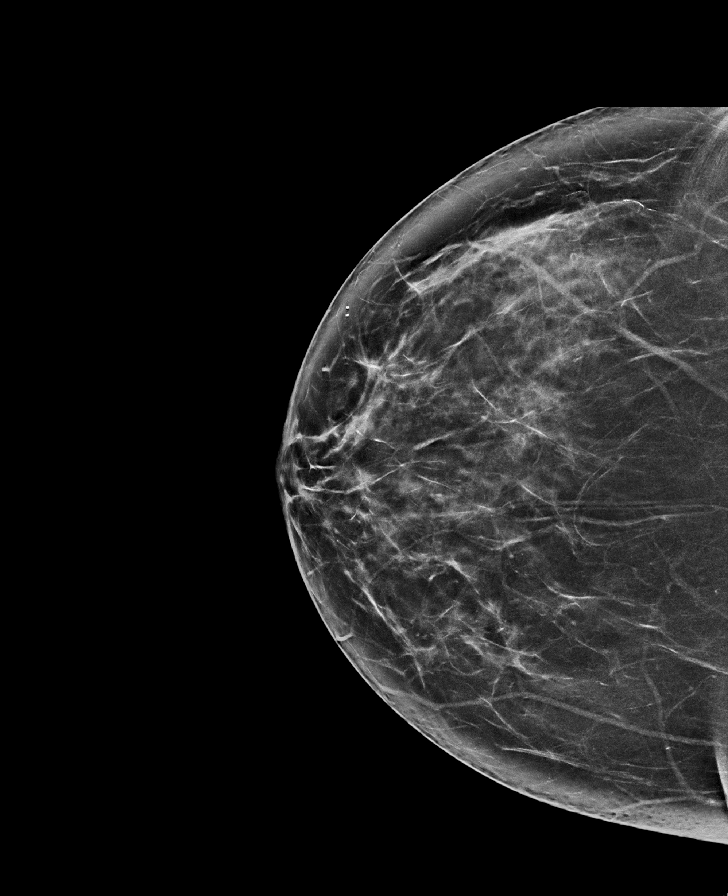

[L CC synth-2D]
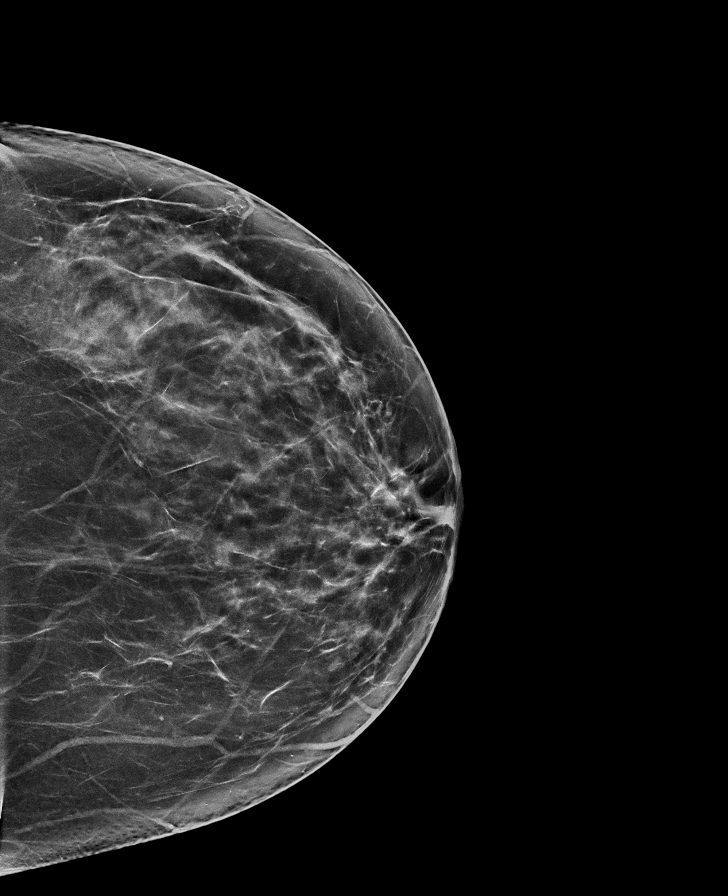

[R MLO synth-2D]
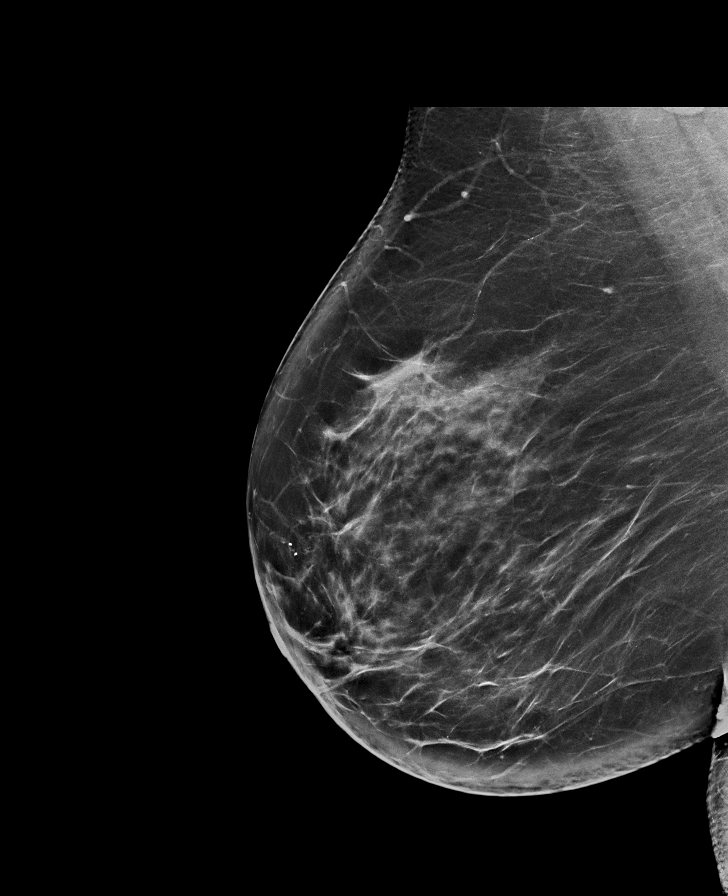

[L MLO tomo · tomo slice 48/95.0]
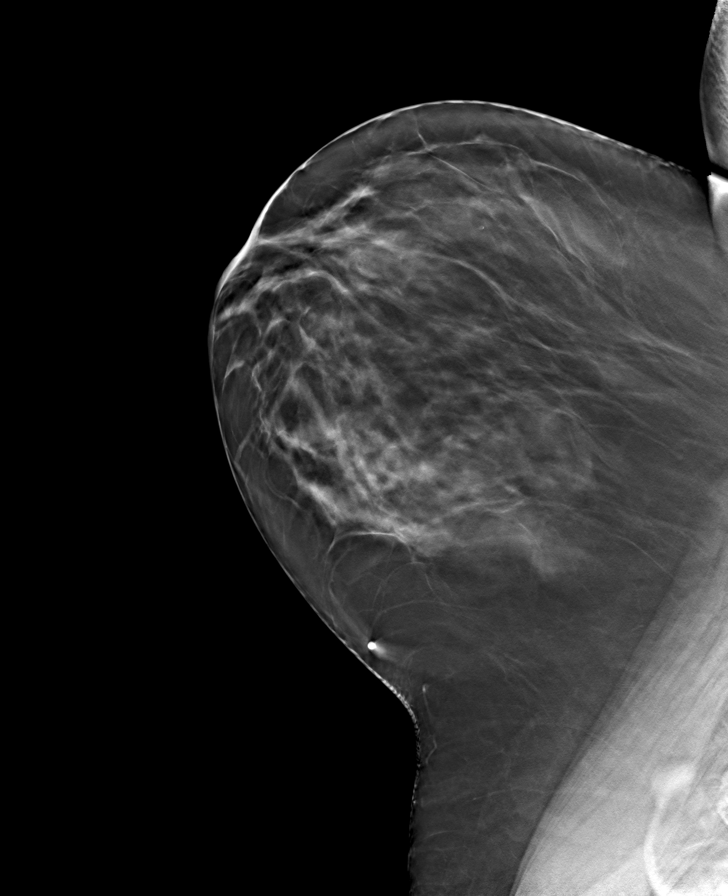

[R MLO tomo · tomo slice 47/93.0]
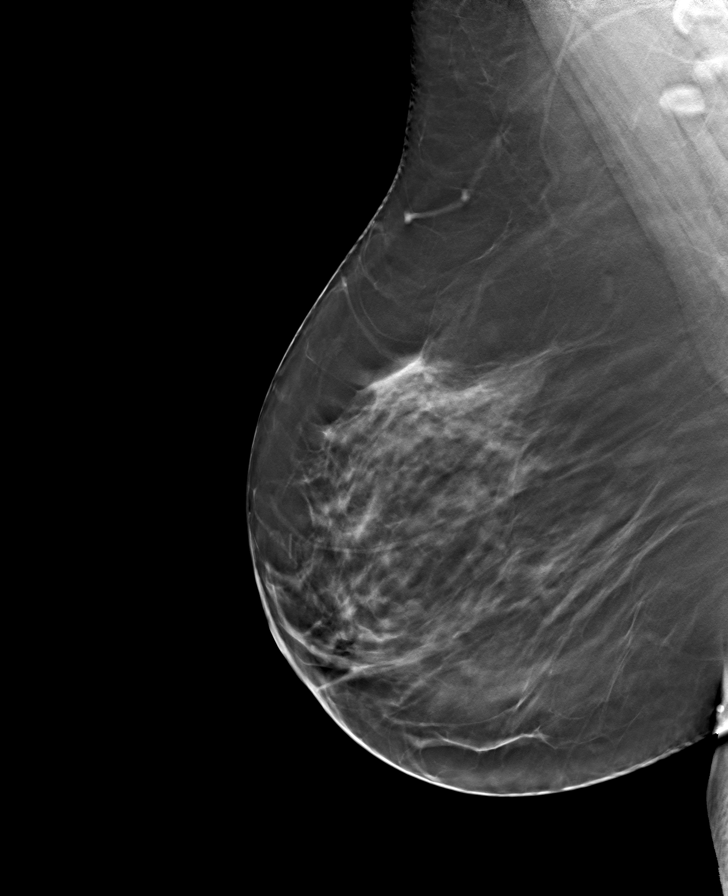

[R CC tomo · tomo slice 41/82.0]
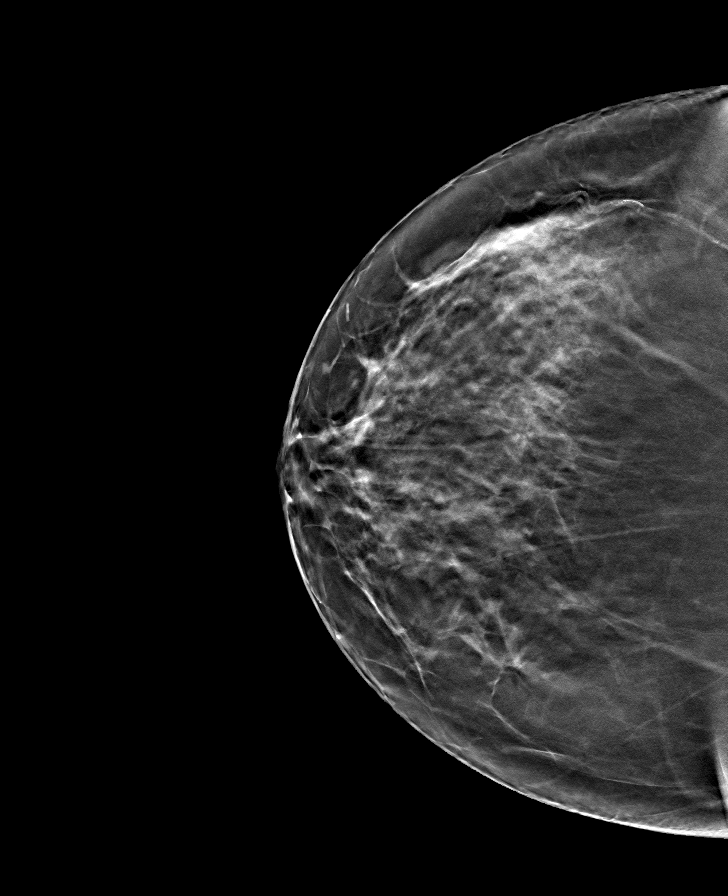

[L CC tomo · tomo slice 42/83.0]
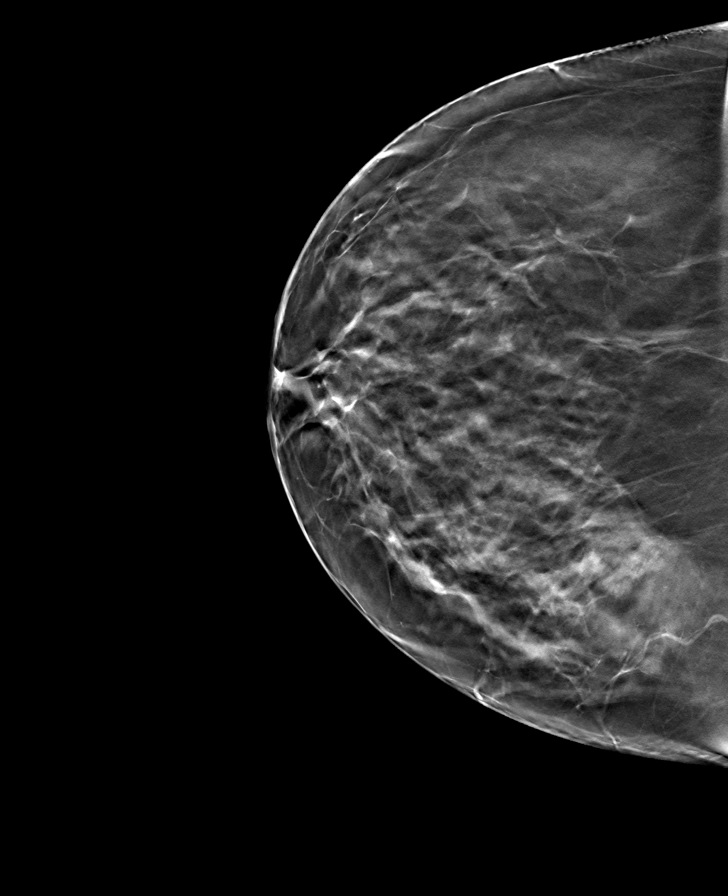

[8 of 24 positions shown; findings below may reference images not displayed]

ACR Breast Density Category c: The breast tissue is heterogeneously
dense, which may obscure small masses.
FINDINGS: There are no findings suspicious for malignancy. Images were
processed with CAD.
IMPRESSION: No mammographic evidence of malignancy. A result letter of this
screening mammogram will be mailed directly to the patient.

RECOMMENDATION:
Screening mammogram in one year. (Code:[id number])

BI-RADS CATEGORY  1: Negative.

## 2019-10-15 ENCOUNTER — Ambulatory Visit (INDEPENDENT_AMBULATORY_CARE_PROVIDER_SITE_OTHER): Payer: 59 | Admitting: Podiatry

## 2019-10-15 ENCOUNTER — Other Ambulatory Visit: Payer: Self-pay

## 2019-10-15 ENCOUNTER — Encounter: Payer: Self-pay | Admitting: Podiatry

## 2019-10-15 DIAGNOSIS — Z9889 Other specified postprocedural states: Secondary | ICD-10-CM

## 2019-10-15 DIAGNOSIS — M722 Plantar fascial fibromatosis: Secondary | ICD-10-CM

## 2019-10-16 ENCOUNTER — Encounter: Payer: Self-pay | Admitting: Podiatry

## 2019-10-16 ENCOUNTER — Encounter: Payer: Self-pay | Admitting: *Deleted

## 2019-10-16 ENCOUNTER — Telehealth: Payer: Self-pay | Admitting: *Deleted

## 2019-10-16 NOTE — Telephone Encounter (Signed)
I'm returning your call.  How can I help you?  "I sent you a message in MyChart with the details.  I need two notes to extend my time to be out of work.  One I need saying I need to work from home for a week and the other needs to say I will need to be out of work until March 30 and that I will slowly transitioning out of the boot in the next month."  I'll send the letters via [place].

## 2019-10-17 ENCOUNTER — Encounter: Payer: Self-pay | Admitting: Podiatry

## 2019-10-18 ENCOUNTER — Encounter: Payer: Self-pay | Admitting: *Deleted

## 2019-10-18 NOTE — Progress Notes (Signed)
   Subjective:  Patient presents today status post EPF right. DOS: 09/26/2019. She states she was doing well and improving until she went back to work. She now reports some soreness of the right foot and states the sutures feel like they are pulling in the left foot. She has been using the CAM boot as directed. Patient is here for further evaluation and treatment.    Past Medical History:  Diagnosis Date  . Allergy   . Asthma   . Hypertension   . Obesity       Objective/Physical Exam Neurovascular status intact.  Skin incisions appear to be well coapted with sutures and staples intact. No sign of infectious process noted. No dehiscence. No active bleeding noted. Moderate edema noted to the surgical extremity.  Assessment: 1. s/p EPF right. DOS: 09/26/2019   Plan of Care:  1. Patient was evaluated.  2. Sutures removed.  3. Transition out of CAM boot over the next four weeks.  4. Compression anklet dispensed.  5. Return to clinic in 4 weeks.   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    Edrick Kins, DPM [facility]  Dr. Edrick Kins, [place]                                        [place], [place] [address]                Office [phone]  Fax [phone]

## 2019-10-22 ENCOUNTER — Encounter: Payer: Self-pay | Admitting: Podiatry

## 2019-10-25 ENCOUNTER — Encounter: Payer: 59 | Admitting: Podiatry

## 2019-10-31 ENCOUNTER — Encounter: Payer: Self-pay | Admitting: Podiatry

## 2019-11-12 ENCOUNTER — Ambulatory Visit (INDEPENDENT_AMBULATORY_CARE_PROVIDER_SITE_OTHER): Payer: 59 | Admitting: Podiatry

## 2019-11-12 ENCOUNTER — Other Ambulatory Visit: Payer: Self-pay

## 2019-11-12 ENCOUNTER — Encounter: Payer: Self-pay | Admitting: *Deleted

## 2019-11-12 DIAGNOSIS — Z9889 Other specified postprocedural states: Secondary | ICD-10-CM

## 2019-11-12 DIAGNOSIS — M722 Plantar fascial fibromatosis: Secondary | ICD-10-CM

## 2019-11-14 NOTE — Progress Notes (Signed)
   Subjective:  Patient presents today status post EPF right. DOS: 09/26/2019. She reports some minor aching pain when she is on her feet for long periods of time. 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 She reports some associated intermittent swelling. She has been elevating the foot and taking Meloxicam and Tylenol for treatment. Patient is here for further evaluation and treatment.    Past Medical History:  Diagnosis Date  . Allergy   . Asthma   . Hypertension   . Obesity       Objective/Physical Exam Neurovascular status intact.  Skin incisions appear to be well coapted. No sign of infectious process noted. No dehiscence. No active bleeding noted. Moderate edema noted to the surgical extremity.  Assessment: 1. s/p EPF right. DOS: 09/26/2019   Plan of Care:  1. Patient was evaluated.  2. Discontinue using CAM boot.  3. Recommended good shoe gear.  4. Patient declined physical therapy at this time.  5. Return to clinic in 4 weeks for final follow up visit.    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 Day job is now back at work.    Edrick Kins, DPM [facility]  Dr. Edrick Kins, [place]                                        [place], [place] [address]                Office [phone]  Fax [phone]

## 2019-12-10 ENCOUNTER — Ambulatory Visit (INDEPENDENT_AMBULATORY_CARE_PROVIDER_SITE_OTHER): Payer: 59 | Admitting: Podiatry

## 2019-12-10 ENCOUNTER — Encounter: Payer: Self-pay | Admitting: Podiatry

## 2019-12-10 ENCOUNTER — Other Ambulatory Visit: Payer: Self-pay

## 2019-12-10 VITALS — Temp 97.8°F

## 2019-12-10 DIAGNOSIS — Z9889 Other specified postprocedural states: Secondary | ICD-10-CM

## 2019-12-10 DIAGNOSIS — M722 Plantar fascial fibromatosis: Secondary | ICD-10-CM

## 2019-12-11 ENCOUNTER — Encounter: Payer: Self-pay | Admitting: Podiatry

## 2019-12-12 NOTE — Progress Notes (Signed)
   Subjective:  Patient presents today status post EPF right. DOS: 09/26/2019. She states she is doing well and improving. She reports some intermittent aching pain that is caused by being on the foot for long periods of time. She has been resting the foot as much as possible for treatment. Patient is here for further evaluation and treatment.    Past Medical History:  Diagnosis Date  . Allergy   . Asthma   . Hypertension   . Obesity       Objective/Physical Exam Neurovascular status intact.  Skin incisions appear to be well coapted. No sign of infectious process noted. No dehiscence. No active bleeding noted. Moderate edema noted to the surgical extremity.  Assessment: 1. s/p EPF right. DOS: 09/26/2019   Plan of Care:  1. Patient was evaluated.  2. Return to work 1/2 night shift only for one month.  3. May resume full activity with no restrictions on 01/13/2020.  4. Recommended good shoe gear.  5. Return to clinic as needed.   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 Day job is now back at work.    Edrick Kins, DPM [facility]  Dr. Edrick Kins, [place]                                        [place], [place] [address]                Office [phone]  Fax [phone]

## 2020-03-02 ENCOUNTER — Encounter: Payer: Self-pay | Admitting: Podiatry

## 2020-03-13 NOTE — Telephone Encounter (Signed)
I called and spoke with patient, discussed her lifting options for work, instructed her to try soaking foot in hot epson salt water and continue icing.  She also said her other ankle has been painful and swelling the past several weeks and thinks she needs it looked at.   She was sent to scheduling for follow up appt with Dr. Amalia [name].

## 2020-03-17 ENCOUNTER — Ambulatory Visit (INDEPENDENT_AMBULATORY_CARE_PROVIDER_SITE_OTHER): Payer: BC Managed Care – PPO

## 2020-03-17 ENCOUNTER — Encounter: Payer: Self-pay | Admitting: Podiatry

## 2020-03-17 ENCOUNTER — Other Ambulatory Visit: Payer: Self-pay | Admitting: Podiatry

## 2020-03-17 ENCOUNTER — Ambulatory Visit: Payer: BC Managed Care – PPO | Admitting: Podiatry

## 2020-03-17 ENCOUNTER — Other Ambulatory Visit: Payer: Self-pay

## 2020-03-17 DIAGNOSIS — M7672 Peroneal tendinitis, left leg: Secondary | ICD-10-CM | POA: Diagnosis not present

## 2020-03-17 DIAGNOSIS — M779 Enthesopathy, unspecified: Secondary | ICD-10-CM

## 2020-03-17 DIAGNOSIS — M722 Plantar fascial fibromatosis: Secondary | ICD-10-CM

## 2020-03-17 MED ORDER — METHYLPREDNISOLONE 4 MG PO TBPK: ORAL_TABLET | ORAL | 0 refills | Status: DC

## 2020-03-17 NOTE — Progress Notes (Signed)
   Subjective:  Patient presents today status post EPF right. DOS: 09/26/2019.  Patient continues to have some pain and tenderness to the plantar heel.  She does believe she is better since prior to surgery however she has not 100%.  She has been taking OTC Tylenol and Aleve for the pain.  She continues to work on her feet however she has reduced her schedule somewhat. Patient has a new complaint today associated to pain to the lateral aspect of the left ankle.  This is been going for approximately 1 month now.  She denies any history of injury or trauma to the area.  No change in activity.  Gradual onset and is currently chronically painful on a daily basis  Past Medical History:  Diagnosis Date  . Allergy   . Asthma   . Hypertension   . Obesity     Objective: Physical Exam General: The patient is alert and oriented x3 in no acute distress.  Dermatology: Skin is cool, dry and supple bilateral lower extremities. Negative for open lesions or macerations.  Vascular: Palpable pedal pulses bilaterally. No edema or erythema noted. Capillary refill within normal limits.  Neurological: Epicritic and protective threshold grossly intact bilaterally.   Musculoskeletal Exam: All pedal and ankle joints range of motion within normal limits bilateral. Muscle strength 5/5 in all groups bilateral.  Pain on palpation noted to the right plantar medial heel at the insertion of the plantar fascia into the medial calcaneal tubercle, at the surgical EPF site.  Pain is tolerable and minimal. Today there is pain on palpation to the peroneal tendon sheath as it passes the lateral malleolus.  Pain noted with range of motion as well.    Assessment: 1. s/p EPF right. DOS: 09/26/2019 2.  Peroneal tendinitis left   Plan of Care:  1. Patient was evaluated.  2.  Injection of 0.5 cc Celestone Soluspan injected into the peroneal tendon sheath left lower extremity 3.  Prescription for Medrol Dosepak 4.  Continue  wearing compression ankle sleeves bilateral 5.  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    Edrick Kins, DPM [facility]  Dr. Edrick Kins, [place]                                        [place], [place] [address]                Office [phone]  Fax [phone]

## 2020-04-03 DIAGNOSIS — Z20822 Contact with and (suspected) exposure to covid-19: Secondary | ICD-10-CM | POA: Diagnosis not present

## 2020-04-17 DIAGNOSIS — Z1231 Encounter for screening mammogram for malignant neoplasm of breast: Secondary | ICD-10-CM | POA: Diagnosis not present

## 2020-04-17 DIAGNOSIS — R635 Abnormal weight gain: Secondary | ICD-10-CM | POA: Diagnosis not present

## 2020-04-17 DIAGNOSIS — E669 Obesity, unspecified: Secondary | ICD-10-CM | POA: Diagnosis not present

## 2020-04-17 DIAGNOSIS — I10 Essential (primary) hypertension: Secondary | ICD-10-CM | POA: Diagnosis not present

## 2020-04-24 ENCOUNTER — Other Ambulatory Visit: Payer: Self-pay | Admitting: Family Medicine

## 2020-04-24 DIAGNOSIS — Z1231 Encounter for screening mammogram for malignant neoplasm of breast: Secondary | ICD-10-CM

## 2020-04-27 ENCOUNTER — Encounter: Payer: Self-pay | Admitting: Radiology

## 2020-04-29 ENCOUNTER — Other Ambulatory Visit: Payer: Self-pay | Admitting: Podiatry

## 2020-05-04 ENCOUNTER — Other Ambulatory Visit: Payer: Self-pay

## 2020-05-04 ENCOUNTER — Ambulatory Visit
Admission: RE | Admit: 2020-05-04 | Discharge: 2020-05-04 | Disposition: A | Discharge status: Home / Self Care | Payer: BC Managed Care – PPO | Source: Ambulatory Visit | Attending: Family Medicine | Admitting: Family Medicine

## 2020-05-04 DIAGNOSIS — Z1231 Encounter for screening mammogram for malignant neoplasm of breast: Secondary | ICD-10-CM | POA: Diagnosis not present

## 2020-05-06 ENCOUNTER — Ambulatory Visit: Payer: BC Managed Care – PPO | Admitting: Family Medicine

## 2020-05-06 ENCOUNTER — Encounter: Payer: Self-pay | Admitting: Family Medicine

## 2020-05-06 ENCOUNTER — Other Ambulatory Visit: Payer: Self-pay

## 2020-05-06 DIAGNOSIS — E559 Vitamin D deficiency, unspecified: Secondary | ICD-10-CM | POA: Diagnosis not present

## 2020-05-06 DIAGNOSIS — M19041 Primary osteoarthritis, right hand: Secondary | ICD-10-CM | POA: Diagnosis not present

## 2020-05-06 DIAGNOSIS — J4520 Mild intermittent asthma, uncomplicated: Secondary | ICD-10-CM

## 2020-05-06 DIAGNOSIS — I10 Essential (primary) hypertension: Secondary | ICD-10-CM | POA: Diagnosis not present

## 2020-05-06 DIAGNOSIS — M19042 Primary osteoarthritis, left hand: Secondary | ICD-10-CM

## 2020-05-06 DIAGNOSIS — Z23 Encounter for immunization: Secondary | ICD-10-CM | POA: Diagnosis not present

## 2020-05-06 LAB — VITAMIN D 25 HYDROXY (VIT D DEFICIENCY, FRACTURES): VITD: 26.37 ng/mL — ABNORMAL LOW (ref 30.00–100.00)

## 2020-05-06 MED ORDER — CALCIUM 600+D3 600-200 MG-UNIT PO TABS: 1.0000 | ORAL_TABLET | Freq: Two times a day (BID) | ORAL | 0 refills | Status: DC

## 2020-05-06 MED ORDER — CALCIUM 600+D3 600-200 MG-UNIT PO TABS: 1.0000 | ORAL_TABLET | Freq: Two times a day (BID) | ORAL | 3 refills | Status: DC

## 2020-05-06 NOTE — Assessment & Plan Note (Signed)
Follows with Dr. Ricard Dillon. Taking phentermine and topiramate fore weight loss. Working on diet/exercise too.

## 2020-05-06 NOTE — Assessment & Plan Note (Signed)
Cont daily supplement. Will check level. >1 year since last checked which was normal.

## 2020-05-06 NOTE — Assessment & Plan Note (Signed)
BP controled on lisinopril and HCTZ.

## 2020-05-06 NOTE — Assessment & Plan Note (Addendum)
Doing well on Breo. Rare use of the albuterol. Cont singulair and Breo. Follows with [facility] [place] [facility] allergy.

## 2020-05-06 NOTE — Assessment & Plan Note (Signed)
Advised trial of tumeric, Discussed harms of daily NSAIDS and encouraged limiting use.

## 2020-05-06 NOTE — Progress Notes (Signed)
Subjective:     Amy Watson is a 49 y.o. female presenting for Establish Care     HPI  #Low vitamin D - would like this checked today - getting some cramping which she thinks is related  #HTN - will occasional not take HCTZ due to urinary side effects - tolerates lisinopril well   Review of Systems   Social History   Tobacco Use  Smoking Status Never Smoker  Smokeless Tobacco Never Used        Objective:    BP Readings from Last 3 Encounters:  05/06/20 110/70  07/10/19 120/81  07/02/18 133/83   Wt Readings from Last 3 Encounters:  05/06/20 219 lb 8 oz (99.6 kg)  07/10/19 204 lb (92.5 kg)  07/02/18 217 lb (98.4 kg)    BP 110/70   Pulse 89   Temp 97.7 F (36.5 C) (Temporal)   Ht 5' 5.5" (1.664 m)   Wt 219 lb 8 oz (99.6 kg)   SpO2 98%   BMI 35.97 kg/m    Physical Exam Constitutional:      General: She is not in acute distress.    Appearance: She is well-developed. She is not diaphoretic.  HENT:     Right Ear: External ear normal.     Left Ear: External ear normal.     Nose: Nose normal.  Eyes:     Conjunctiva/sclera: Conjunctivae normal.  Cardiovascular:     Rate and Rhythm: Normal rate.  Pulmonary:     Effort: Pulmonary effort is normal.  Musculoskeletal:     Cervical back: Neck supple.  Skin:    General: Skin is warm and dry.     Capillary Refill: Capillary refill takes less than 2 seconds.  Neurological:     Mental Status: She is alert. Mental status is at baseline.  Psychiatric:        Mood and Affect: Mood normal.        Behavior: Behavior normal.           Assessment & Plan:   Problem List Items Addressed This Visit      Cardiovascular and Mediastinum   Hypertension    BP controled on lisinopril and HCTZ.         Respiratory   Asthma without status asthmaticus    Doing well on Breo. Rare use of the albuterol. Cont singulair and Breo. Follows with [facility].         Musculoskeletal and Integument    Primary osteoarthritis of both hands    Advised trial of tumeric, Discussed harms of daily NSAIDS and encouraged limiting use.         Other   Morbid obesity ([place]) - Primary    Follows with Dr. Ricard Dillon. Taking phentermine and topiramate fore weight loss. Working on diet/exercise too.       Relevant Medications   phentermine 15 MG capsule   Vitamin D deficiency    Cont daily supplement. Will check level. >1 year since last checked which was normal.       Relevant Medications   Calcium Carb-Cholecalciferol (CALCIUM 600+D3) 600-200 MG-UNIT TABS   Other Relevant Orders   Vitamin D, 25-hydroxy       Return in about 3 months (around 08/05/2020).  Lesleigh Noe, MD  This visit occurred during the SARS-CoV-2 public health emergency.  Safety protocols were in place, including screening questions prior to the visit, additional usage of staff PPE, and extensive cleaning of exam room while  observing appropriate contact time as indicated for disinfecting solutions.

## 2020-05-06 NOTE — Patient Instructions (Addendum)
Curcumin or Tumeric  Research has shown that Curcumin (Tumeric) supplements are just as good as high dose anti-inflammatory medications (like diclofenac and ibuprofen) at treating pain from osteoarthritis without the side effects.   Take Curcumin 500 mg Three times daily   -- You can find a bottle at [employer] for $8 which should last a month -- Wilson is around $9 and is available at [place]  1) 800 units of Vitamin D daily 2) Get 1200 mg of elemental calcium --- this is best from your diet. Try to track how much calcium you get on a typical day. You could find ways to add more (dairy products, leafy greens). Take a supplement for whatever you don't typically get so you reach 1200 mg of calcium.  3) Physical activity (ideally weight bearing) - like walking briskly 30 minutes 5 days a week.

## 2020-05-07 ENCOUNTER — Other Ambulatory Visit: Payer: Self-pay | Admitting: Family Medicine

## 2020-05-07 DIAGNOSIS — E559 Vitamin D deficiency, unspecified: Secondary | ICD-10-CM

## 2020-05-07 MED ORDER — VITAMIN D (ERGOCALCIFEROL) 1.25 MG (50000 UNIT) PO CAPS: 50000.0000 [IU] | ORAL_CAPSULE | ORAL | 1 refills | Status: DC

## 2020-05-11 ENCOUNTER — Encounter: Payer: Self-pay | Admitting: Family Medicine

## 2020-05-11 DIAGNOSIS — E559 Vitamin D deficiency, unspecified: Secondary | ICD-10-CM

## 2020-05-12 MED ORDER — CHOLECALCIFEROL 1.25 MG (50000 UT) PO TABS: 1.0000 | ORAL_TABLET | ORAL | 1 refills | Status: DC

## 2020-05-12 NOTE — Addendum Note (Signed)
Addended by: Lesleigh Noe on: 05/12/2020 09:34 AM   Modules accepted: Orders

## 2020-06-04 ENCOUNTER — Other Ambulatory Visit: Payer: Self-pay | Admitting: Family Medicine

## 2020-06-04 DIAGNOSIS — E559 Vitamin D deficiency, unspecified: Secondary | ICD-10-CM

## 2020-06-14 NOTE — Progress Notes (Signed)
Amy Bourbeau T. Rosabella Edgin, Amy Watson, Amy Watson, Amy Watson  Phone: [phone]  FAX: [phone]  Amy Watson - 49 y.o. female  MRN [id number]  Date of Birth: Dec 03, 1970  Date: 06/15/2020  PCP: Lesleigh Noe, Amy Watson  Referral: Lesleigh Noe, Amy Watson  Chief Complaint  Patient presents with  . Foot Pain    Right-Dropped board on foot 04/17/20    This visit occurred during the SARS-CoV-2 public health emergency.  Safety protocols were in place, including screening questions prior to the visit, additional usage of staff PPE, and extensive cleaning of exam room while observing appropriate contact time as indicated for disinfecting solutions.   Subjective:   Amy Watson is a 49 y.o. very pleasant female patient with Body mass index is 36.14 kg/m. who presents with the following:  She presents after dropping some plywood on her foot about 1 month ago with ongoing pain.  04/17/2020 date of injury:  8 x 5 foot piece of plywood  Swollen and black and blue.  She has had some limitation in her ability to walk.  Since the injury she has been walking about 10-15,000 steps a day and she does have a primary job as well as a secondary job at [employer].  She does have pain predominantly in and about the region of the proximal second and third metatarsals.  No prior injuries that she can recall and no history of operative intervention.  Review of Systems is noted in the HPI, as appropriate   Objective:   BP 120/80   Pulse 89   Temp 98.6 F (37 C) (Temporal)   Ht 5' 5.5" (1.664 m)   Wt 220 lb 8 oz (100 kg)   SpO2 98%   BMI 36.14 kg/m   Right foot: Nontender throughout the entirety of the ankle as well as the hindfoot.  The midfoot is nontender as well, and peroneal, posterior tibialis and Achilles tendons are all nontender.  All phalanges are nontender.  She does have  tenderness primarily at the proximal third metatarsal and to a lesser extent the second.  Radiology: No results found.  Assessment and Plan:     ICD-10-CM   1. Acute foot pain, right  M79.671 DG Foot Complete Right   Pain at the proximal third.  On plain film, it does look to me like there is some evidence of bony callus being resorbed with a almost completely healed metatarsal fracture.  Decrease activity, continue to wear her very rigid sole clogs, and lower her activity patterns as able.  I think that this should do quite well and she has had some delayed healing due to increased activity.  No orders of the defined types were placed in this encounter.  There are no discontinued medications. Orders Placed This Encounter  Procedures  . DG Foot Complete Right    Follow-up: No follow-ups on file.  Signed,  Maud Deed. Samar Dass, Amy Watson   Outpatient Encounter Medications as of 06/15/2020  Medication Sig  . ALBUTEROL IN Inhale into the lungs.  . Calcium Carb-Cholecalciferol (CALCIUM 600+D3) 600-200 MG-UNIT TABS Take 1 tablet by mouth in the morning and at bedtime.  . cetirizine (ZYRTEC) 10 MG tablet Take 10 mg by mouth as needed.   . Cholecalciferol (VITAMIN D3) 1.25 MG (50000 UT) CAPS TAKE 1 TABLET BY MOUTH ONCE A WEEK.  . Fluticasone Furoate-Vilanterol (BREO ELLIPTA IN) Inhale  into the lungs.  . hydrochlorothiazide (HYDRODIURIL) 12.5 MG tablet Take 12.5 mg by mouth daily.  Marland Kitchen lisinopril (PRINIVIL,ZESTRIL) 20 MG tablet Take 20 mg by mouth daily.  . meloxicam (MOBIC) 7.5 MG tablet Take 7.5 mg by mouth as needed.   . montelukast (SINGULAIR) 10 MG tablet Take by mouth as needed.  . naproxen sodium (ALEVE) 220 MG tablet Take by mouth.  Marland Kitchen omeprazole (PRILOSEC OTC) 20 MG tablet Take 1 tablet (20 mg total) daily by mouth. (Patient taking differently: Take 20 mg by mouth as needed. )  . phentermine 15 MG capsule Take 15 mg by mouth every morning.  Marland Kitchen PROAIR HFA 108 (90 Base) MCG/ACT inhaler  TAKE 1 2 PUFFS AS NEEDED EVERY 4 6 HOURS AS NEEDED FOR COUGH/WHEEZE  . topiramate (TOPAMAX) 25 MG tablet PLEASE SEE ATTACHED FOR DETAILED DIRECTIONS   No facility-administered encounter medications on file as of 06/15/2020.

## 2020-06-15 ENCOUNTER — Ambulatory Visit (INDEPENDENT_AMBULATORY_CARE_PROVIDER_SITE_OTHER)
Admission: RE | Admit: 2020-06-15 | Discharge: 2020-06-15 | Disposition: A | Discharge status: Home / Self Care | Payer: BC Managed Care – PPO | Source: Ambulatory Visit | Attending: Family Medicine | Admitting: Family Medicine

## 2020-06-15 ENCOUNTER — Ambulatory Visit: Payer: BC Managed Care – PPO | Admitting: Family Medicine

## 2020-06-15 ENCOUNTER — Other Ambulatory Visit: Payer: Self-pay

## 2020-06-15 ENCOUNTER — Encounter: Payer: Self-pay | Admitting: Family Medicine

## 2020-06-15 VITALS — BP 120/80 | HR 89 | Temp 98.6°F | Ht 65.5 in | Wt 220.5 lb

## 2020-06-15 DIAGNOSIS — M7731 Calcaneal spur, right foot: Secondary | ICD-10-CM | POA: Diagnosis not present

## 2020-06-15 DIAGNOSIS — M79671 Pain in right foot: Secondary | ICD-10-CM | POA: Diagnosis not present

## 2020-06-15 DIAGNOSIS — S99921A Unspecified injury of right foot, initial encounter: Secondary | ICD-10-CM | POA: Diagnosis not present

## 2020-06-16 DIAGNOSIS — J3089 Other allergic rhinitis: Secondary | ICD-10-CM | POA: Diagnosis not present

## 2020-06-16 DIAGNOSIS — J4530 Mild persistent asthma, uncomplicated: Secondary | ICD-10-CM | POA: Diagnosis not present

## 2020-06-16 DIAGNOSIS — J301 Allergic rhinitis due to pollen: Secondary | ICD-10-CM | POA: Diagnosis not present

## 2020-07-01 ENCOUNTER — Other Ambulatory Visit: Payer: Self-pay | Admitting: Family Medicine

## 2020-07-01 DIAGNOSIS — E559 Vitamin D deficiency, unspecified: Secondary | ICD-10-CM

## 2020-07-13 DIAGNOSIS — J680 Bronchitis and pneumonitis due to chemicals, gases, fumes and vapors: Secondary | ICD-10-CM | POA: Diagnosis not present

## 2020-07-28 ENCOUNTER — Other Ambulatory Visit: Payer: Self-pay | Admitting: Family Medicine

## 2020-07-28 DIAGNOSIS — E559 Vitamin D deficiency, unspecified: Secondary | ICD-10-CM

## 2020-07-30 ENCOUNTER — Encounter: Payer: Self-pay | Admitting: Family Medicine

## 2020-07-30 ENCOUNTER — Ambulatory Visit (INDEPENDENT_AMBULATORY_CARE_PROVIDER_SITE_OTHER): Payer: BC Managed Care – PPO | Admitting: Family Medicine

## 2020-07-30 ENCOUNTER — Other Ambulatory Visit: Payer: Self-pay

## 2020-07-30 VITALS — BP 130/80 | HR 94 | Temp 98.1°F | Ht 66.0 in | Wt 224.5 lb

## 2020-07-30 DIAGNOSIS — I10 Essential (primary) hypertension: Secondary | ICD-10-CM

## 2020-07-30 DIAGNOSIS — E559 Vitamin D deficiency, unspecified: Secondary | ICD-10-CM | POA: Diagnosis not present

## 2020-07-30 DIAGNOSIS — Z0000 Encounter for general adult medical examination without abnormal findings: Secondary | ICD-10-CM

## 2020-07-30 LAB — LIPID PANEL
Cholesterol: 158 mg/dL (ref 0–200)
HDL: 50.6 mg/dL (ref >39.00)
LDL Cholesterol: 86 mg/dL (ref 0–99)
NonHDL: 107.15
Total CHOL/HDL Ratio: 3
Triglycerides: 108 mg/dL (ref 0.0–149.0)
VLDL: 21.6 mg/dL (ref 0.0–40.0)

## 2020-07-30 LAB — COMPREHENSIVE METABOLIC PANEL
ALT: 19 U/L (ref 0–35)
AST: 16 U/L (ref 0–37)
Albumin: 4 g/dL (ref 3.5–5.2)
Alkaline Phosphatase: 59 U/L (ref 39–117)
BUN: 13 mg/dL (ref 6–23)
CO2: 26 mEq/L (ref 19–32)
Calcium: 9 mg/dL (ref 8.4–10.5)
Chloride: 108 mEq/L (ref 96–112)
Creatinine, Ser: 0.57 mg/dL (ref 0.40–1.20)
GFR: 105.73 mL/min (ref >60.00)
Glucose, Bld: 81 mg/dL (ref 70–99)
Potassium: 4.3 mEq/L (ref 3.5–5.1)
Sodium: 138 mEq/L (ref 135–145)
Total Bilirubin: 0.3 mg/dL (ref 0.2–1.2)
Total Protein: 6.9 g/dL (ref 6.0–8.3)

## 2020-07-30 LAB — VITAMIN D 25 HYDROXY (VIT D DEFICIENCY, FRACTURES): VITD: 49.04 ng/mL (ref 30.00–100.00)

## 2020-07-30 NOTE — Progress Notes (Signed)
Annual Exam   Chief Complaint:  Chief Complaint  Patient presents with  . Annual Exam    No concerns    History of Present Illness:  Ms. Amy Watson is a 49 y.o. G2P2 who LMP was No LMP recorded. (Menstrual status: Perimenopausal)., presents today for her annual examination.     Nutrition She does not get adequate calcium and Vitamin D in her diet. Diet: not great Exercise: working with physical therapy  Still working with Dr. Ricard Dillon with [facility] on weight loss but not currently taking her medications  Safety The patient wears seatbelts: yes.     The patient feels safe at home and in their relationships: yes.   Menstrual:  Symptoms of menopause: hot flashes are improving Continues to have monthly cycles  GYN She is not sexually active.    Cervical Cancer Screening (21-65):   Last Pap:   July 2018 Results were: no abnormalities /neg HPV DNA   Breast Cancer Screening (Age 47-74):  There is no FH of breast cancer. There is no FH of ovarian cancer. BRCA screening Not Indicated.  Last Mammogram: 04/2020 The patient does want a mammogram this year.    Colon Cancer Screening:  Age 69-75 yo - benefits outweigh the risk. Adults 25-85 yo who have never been screened benefit.  Benefits: 134000 people in 2016 will be diagnosed and 49,000 will die - early detection helps Harms: Complications 2/2 to colonoscopy High Risk (Colonoscopy): genetic disorder (Lynch syndrome or familial adenomatous polyposis), personal hx of IBD, previous adenomatous polyp, or previous colorectal cancer, FamHx start 10 years before the age at diagnosis, increased in males and black race  Options:  FIT - looks for hemoglobin (blood in the stool) - specific and fairly sensitive - must be done annually Cologuard - looks for DNA and blood - more sensitive - therefore can have more false positives, every 3 years Colonoscopy - every 10 years if normal - sedation, bowl prep, must have someone drive  you  Shared decision making and the patient had decided to do is participating in a trial for a blood test for colon cancer screening.   Social History   Tobacco Use  Smoking Status Never Smoker  Smokeless Tobacco Never Used    Lung Cancer Screening (Ages 64-33): not applicable   Weight Wt Readings from Last 3 Encounters:  07/30/20 224 lb 8 oz (101.8 kg)  06/15/20 220 lb 8 oz (100 kg)  05/06/20 219 lb 8 oz (99.6 kg)   Patient has very high BMI  BMI Readings from Last 1 Encounters:  07/30/20 36.24 kg/m     Chronic disease screening Blood pressure monitoring:  BP Readings from Last 3 Encounters:  07/30/20 130/80  06/15/20 120/80  05/06/20 110/70    Lipid Monitoring: Indication for screening: age >22, obesity, diabetes, family hx, CV risk factors.  Lipid screening: Yes  Lab Results  Component Value Date   CHOL 186 07/17/2019   HDL 54 07/17/2019   LDLCALC 114 (H) 07/17/2019   TRIG 97 07/17/2019   CHOLHDL 3.4 07/17/2019     Diabetes Screening: age >51, overweight, family hx, PCOS, hx of gestational diabetes, at risk ethnicity Diabetes Screening screening: Yes  Lab Results  Component Value Date   HGBA1C 5.2 07/17/2019     Past Medical History:  Diagnosis Date  . Allergy   . Asthma   . Hypertension   . Obesity     Past Surgical History:  Procedure Laterality Date  . FOOT  SURGERY    . FOOT SURGERY    . NECK SURGERY      Prior to Admission medications   Medication Sig Start Date End Date Taking? Authorizing Provider  ADVAIR DISKUS 250-50 MCG/DOSE AEPB Inhale 1 puff into the lungs 2 (two) times daily. 07/15/20  Yes [provider]  ALBUTEROL IN Inhale into the lungs.   Yes [provider]  Calcium Carb-Cholecalciferol (CALCIUM 600+D3) 600-200 MG-UNIT TABS Take 1 tablet by mouth in the morning and at bedtime. 05/06/20  Yes Lesleigh Noe, MD  cetirizine (ZYRTEC) 10 MG tablet Take 10 mg by mouth as needed.    Yes [provider]  Cholecalciferol (VITAMIN D3) 1.25 MG (50000 UT) CAPS TAKE 1 TABLET BY MOUTH ONCE A WEEK. 07/01/20  Yes Lesleigh Noe, MD  gabapentin (NEURONTIN) 100 MG capsule Take 100 mg by mouth 3 (three) times daily. 07/29/20  Yes [provider]  hydrochlorothiazide (HYDRODIURIL) 12.5 MG tablet Take 12.5 mg by mouth daily.   Yes [provider]  lisinopril (PRINIVIL,ZESTRIL) 20 MG tablet Take 20 mg by mouth daily.   Yes [provider]  meloxicam (MOBIC) 7.5 MG tablet Take 7.5 mg by mouth as needed.  06/18/18  Yes [provider]  montelukast (SINGULAIR) 10 MG tablet Take by mouth as needed.   Yes [provider]  naproxen sodium (ALEVE) 220 MG tablet Take by mouth.   Yes [provider]  omeprazole (PRILOSEC OTC) 20 MG tablet Take 1 tablet (20 mg total) daily by mouth. Patient taking differently: Take 20 mg by mouth as needed. 06/19/17 05/06/21 Yes Hinda Kehr, MD  PROAIR HFA [id number] Base) MCG/ACT inhaler TAKE 1 2 PUFFS AS NEEDED EVERY 4 6 HOURS AS NEEDED FOR COUGH/WHEEZE 11/08/18  Yes [provider]  Vitamin D, Ergocalciferol, (DRISDOL) 1.25 MG (50000 UNIT) CAPS capsule TAKE 1 CAPSULE BY MOUTH ONCE A WEEK FOR 12 WEEKS   Yes [provider]  Fluticasone Furoate-Vilanterol (BREO ELLIPTA IN) Inhale into the lungs. Patient not taking: Reported on 07/30/2020    [provider]  phentermine 15 MG capsule Take 15 mg by mouth every morning. Patient not taking: Reported on 07/30/2020    [provider]  topiramate (TOPAMAX) 25 MG tablet PLEASE SEE ATTACHED FOR DETAILED DIRECTIONS Patient not taking: Reported on 07/30/2020 04/03/19   [provider]    Allergies  Allergen Reactions  . Azithromycin Other (See Comments)    C. Diff./  Pain     Gynecologic History: No LMP recorded. (Menstrual status: Perimenopausal).  Obstetric History: G2P2  Social History   Socioeconomic History  . Marital status:  Divorced    Spouse name: Not on file  . Number of children: 2  . Years of education: some college  . Highest education level: Not on file  Occupational History  . Not on file  Tobacco Use  . Smoking status: Never Smoker  . Smokeless tobacco: Never Used  Vaping Use  . Vaping Use: Never used  Substance and Sexual Activity  . Alcohol use: Yes    Comment: 1 per week  . Drug use: No  . Sexual activity: Not Currently  Other Topics Concern  . Not on file  Social History Narrative   05/06/20   From: [place]   Living: oldest daughter Watt Climes)   Work: Psychologist, 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[employer]      Family: 2 children - Watt Climes and Judson Roch      Enjoys: read, Haematologist  Exercise: parttime job - steps at work   Diet: working with obesity specialist to try to reduce appetite      Safety   Seat belts: Yes    Guns: Yes  and secure   Safe in relationships: Yes    Social Determinants of Radio broadcast assistant Strain: Not on file  Food Insecurity: Not on file  Transportation Needs: Not on file  Physical Activity: Not on file  Stress: Not on file  Social Connections: Not on file  Intimate Partner Violence: Not on file    Family History  Problem Relation Age of Onset  . Breast cancer Mother 76  . Diabetes Father   . Hypertension Father   . Cancer Maternal Grandfather        skin    Review of Systems  Constitutional: Negative for chills and fever.  HENT: Negative for congestion and sore throat.   Eyes: Negative for blurred vision and double vision.  Respiratory: Negative for shortness of breath.   Cardiovascular: Negative for chest pain.  Gastrointestinal: Negative for heartburn, nausea and vomiting.  Genitourinary: Negative.   Musculoskeletal: Negative.  Negative for myalgias.  Skin: Negative for rash.  Neurological: Negative for dizziness and headaches.  Endo/Heme/Allergies: Does not bruise/bleed easily.  Psychiatric/Behavioral: Negative for depression. The patient is not  nervous/anxious.      Physical Exam BP 130/80   Pulse 94   Temp 98.1 F (36.7 C) (Temporal)   Ht _0  (1.676 m)   Wt 224 lb 8 oz (101.8 kg)   SpO2 97%   BMI 36.24 kg/m    BP Readings from Last 3 Encounters:  07/30/20 130/80  06/15/20 120/80  05/06/20 110/70      Physical Exam Constitutional:      General: She is not in acute distress.    Appearance: She is well-developed and well-nourished. She is not diaphoretic.  HENT:     Head: Normocephalic and atraumatic.     Right Ear: External ear normal.     Left Ear: External ear normal.     Nose: Nose normal.     Mouth/Throat:     Mouth: Oropharynx is clear and moist.  Eyes:     General: No scleral icterus.    Extraocular Movements: EOM normal.     Conjunctiva/sclera: Conjunctivae normal.  Cardiovascular:     Rate and Rhythm: Normal rate and regular rhythm.     Heart sounds: No murmur heard.   Pulmonary:     Effort: Pulmonary effort is normal. No respiratory distress.     Breath sounds: Normal breath sounds. No wheezing.  Abdominal:     General: Bowel sounds are normal. There is no distension.     Palpations: Abdomen is soft. There is no mass.     Tenderness: There is no abdominal tenderness. There is no guarding or rebound.  Musculoskeletal:        General: No edema. Normal range of motion.     Cervical back: Neck supple.     Comments: Tape on right hand. Brace on Left ankle  Lymphadenopathy:     Cervical: No cervical adenopathy.  Skin:    General: Skin is warm and dry.     Capillary Refill: Capillary refill takes less than 2 seconds.  Neurological:     Mental Status: She is alert and oriented to person, place, and time.     Deep Tendon Reflexes: Strength normal. Reflexes normal.  Psychiatric:  Mood and Affect: Mood and affect normal.        Behavior: Behavior normal.     Results:  PHQ-9:  [place] Office Visit from 05/06/2020 in [facility] at [place]  PHQ-9 Total Score 7         Assessment: 50 y.o. G2P2 female here for routine annual physical examination.  Plan: Problem List Items Addressed This Visit      Cardiovascular and Mediastinum   Hypertension   Relevant Orders   Comprehensive metabolic panel     Other   Morbid obesity ([place])   Relevant Orders   Lipid panel   Vitamin D deficiency   Relevant Orders   Vitamin D, 25-hydroxy    Other Visit Diagnoses    Annual physical exam    -  Primary      Screening: -- Blood pressure screen normal -- cholesterol screening: will obtain -- Weight screening: obese: discussed management options, including lifestyle, dietary, and exercise. -- Diabetes Screening: not due for screening -- Nutrition: Encouraged healthy diet  The 10-year ASCVD risk score Mikey Bussing DC Jr., et al., 2013) is: 1.6%   Values used to calculate the score:     Age: 56 years     Sex: Female     Is Non-Hispanic African American: No     Diabetic: No     Tobacco smoker: No     Systolic Blood Pressure: 861 mmHg     Is BP treated: Yes     HDL Cholesterol: 54 mg/dL     Total Cholesterol: 186 mg/dL  -- Statin therapy for Age 72-75 with CVD risk >7.5%  Psych -- Depression screening (PHQ-9):  [place] Visit from 05/06/2020 in [facility] at [place]  PHQ-9 Total Score 7       Safety -- tobacco screening: not using -- alcohol screening:  low-risk usage. -- no evidence of domestic violence or intimate partner violence.   Cancer Screening -- pap smear not collected per ASCCP guidelines -- family history of breast cancer screening: done. not at high risk. -- Mammogram - up to date -- Colon cancer (age 74+)-- pt reports this was done as part of a study. She will upload the results  Immunizations Immunization History  Administered Date(s) Administered  . Influenza,inj,Quad PF,6+ Mos 05/13/2019, 05/06/2020  . PFIZER SARS-COV-2 Vaccination 10/23/2019, 11/13/2019, 06/16/2020    -- flu vaccine up to  date -- TDAP q10 years unknown, record requested -- Shingles (age >39) pt will consider -- Covid-19 Vaccine up to date   Encouraged healthy diet and exercise. Encouraged regular vision and dental care.    Lesleigh Noe, MD

## 2020-07-30 NOTE — Patient Instructions (Addendum)
Check with insurance about the Shingles Vaccine  Upload the information for the colon cancer screening  Labs today  Work on healthy diet

## 2020-10-06 ENCOUNTER — Other Ambulatory Visit: Payer: Self-pay | Admitting: Family Medicine

## 2020-10-06 DIAGNOSIS — E559 Vitamin D deficiency, unspecified: Secondary | ICD-10-CM

## 2020-10-06 NOTE — Telephone Encounter (Signed)
Pharmacy requests refill on: Calcium 600-Vit D3 400  LAST REFILL: 05/06/2020 (Q-180, R-3) LAST OV: 07/30/2020 NEXT OV: Not Scheduled  PHARMACY: [facility] [place], [place]

## 2020-11-19 ENCOUNTER — Other Ambulatory Visit: Payer: Self-pay | Admitting: Family Medicine

## 2020-11-19 ENCOUNTER — Other Ambulatory Visit: Payer: Self-pay

## 2020-11-19 ENCOUNTER — Ambulatory Visit: Payer: BC Managed Care – PPO | Admitting: Family Medicine

## 2020-11-19 ENCOUNTER — Encounter: Payer: Self-pay | Admitting: Family Medicine

## 2020-11-19 VITALS — BP 140/80 | HR 98 | Temp 97.2°F | Wt 228.2 lb

## 2020-11-19 DIAGNOSIS — F4323 Adjustment disorder with mixed anxiety and depressed mood: Secondary | ICD-10-CM

## 2020-11-19 DIAGNOSIS — E559 Vitamin D deficiency, unspecified: Secondary | ICD-10-CM

## 2020-11-19 MED ORDER — PROPRANOLOL HCL 10 MG PO TABS: 10.0000 mg | ORAL_TABLET | Freq: Two times a day (BID) | ORAL | 0 refills | Status: DC | PRN

## 2020-11-19 NOTE — Patient Instructions (Addendum)
Try Propranolol 10-20 mg before work  Can take twice daily if needed - monitor for lightheadedness, fatigue, dizziness   Mychart in 1-2 weeks if ineffective and wanting to try Hydroxyzine   How to help anxiety - without medication.   1) Regular Exercise - walking, jogging, cycling, dancing, strength training --> Yoga has been shown in research to reduce depression and anxiety -- with even just one hour long session per week  2)  Begin a Mindfulness/Meditation practice -- this can take a little as 3 minutes and is helpful for all kinds of mood issues -- You can find resources in books -- Or you can download apps like  ---- Headspace App (which currently has free content called "Weathering the Storm") ---- Calm (which has a few free options)  ---- Insignt Timer ---- Stop, Breathe & Think  # With each of these Apps - you should decline the "start free trial" offer and as you search through the App should be able to access some of their free content. You can also chose to pay for the content if you find one that works well for you.   # Many of them also offer sleep specific content which may help with insomnia  3) Healthy Diet -- Avoid or decrease Caffeine -- Avoid or decrease Alcohol -- Drink plenty of water, have a balanced diet -- Avoid cigarettes and marijuana (as well as other recreational drugs)  4) Consider contacting a professional therapist  -- [facility] is one option. Call [phone] -- Or you can check out www.psychologytoday.com -- you can read bios of therapists and see if they accept insurance -- Check with your insurance to see if you have coverage and who may take your insurance

## 2020-11-19 NOTE — Progress Notes (Signed)
Subjective:     Amy Watson is a 50 y.o. 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 - 22 yo daughter is traveling currently alone overseas - [place] for 2 months - more irritability at work - drank alcohol to try and treat symptoms - feels balled up - having difficulty relaxing  Was on medication in the past 15 years ago - went on several medications and not even sure what finally worked   Review of Systems   Social History   Tobacco Use  Smoking Status Never Smoker  Smokeless Tobacco Never Used        Objective:    BP Readings from Last 3 Encounters:  11/19/20 140/80  07/30/20 130/80  06/15/20 120/80   Wt Readings from Last 3 Encounters:  11/19/20 228 lb 4 oz (103.5 kg)  07/30/20 224 lb 8 oz (101.8 kg)  06/15/20 220 lb 8 oz (100 kg)    BP 140/80   Pulse 98   Temp (!) 97.2 F (36.2 C) (Temporal)   Wt 228 lb 4 oz (103.5 kg)   SpO2 97%   BMI 36.84 kg/m    Physical Exam Constitutional:      General: She is not in acute distress.    Appearance: She is well-developed. She is not diaphoretic.  HENT:     Right Ear: External ear normal.     Left Ear: External ear normal.  Eyes:     Conjunctiva/sclera: Conjunctivae normal.  Cardiovascular:     Rate and Rhythm: Normal rate.  Pulmonary:     Effort: Pulmonary effort is normal.  Musculoskeletal:     Cervical back: Neck supple.  Skin:    General: Skin is warm and dry.     Capillary Refill: Capillary refill takes less than 2 seconds.  Neurological:     Mental Status: She is alert. Mental status is at baseline.  Psychiatric:        Mood and Affect: Mood normal.        Behavior: Behavior normal.     GAD 7 : Generalized Anxiety Score 11/19/2020 05/06/2020  Nervous, Anxious, on Edge 3 0  Control/stop worrying 3 1  Worry too much - different things 3 2  Trouble  relaxing 3 0  Restless 2 0  Easily annoyed or irritable 3 1  Afraid - awful might happen 3 0  Total GAD 7 Score 20 4  Anxiety Difficulty Extremely difficult Very difficult    Depression screen [facility] 2/9 11/19/2020 07/30/2020 05/06/2020  Decreased Interest 1 1 1   Down, Depressed, Hopeless 0 0 0  PHQ - 2 Score 1 1 1   Altered sleeping 1 1 0  Tired, decreased energy 3 1 3   Change in appetite 3 1 2   Feeling bad or failure about yourself  0 0 1  Trouble concentrating 2 0 0  Moving slowly or fidgety/restless 3 0 0  Suicidal thoughts 0 0 0  PHQ-9 Score 13 4 7   Difficult doing work/chores Very difficult Not difficult at all Very difficult        Assessment & Plan:   Problem List Items Addressed This Visit      Other   Adjustment disorder with mixed anxiety and depressed mood - Primary    Symptoms present for ~1 month and in setting of high stress. Discussed therapy  and advised - she is not interested at this time. Encouraged non-medication approaches. Trial of propranolol daily or prn bid for general anxiety. If ineffective will try hydroxyzine. Return 6-8 weeks if not improving and symptoms persisting. Pt with hx of anxiety/depression so high risk for relapse and needing SSRI management.      Relevant Medications   propranolol (INDERAL) 10 MG tablet       Return in about 6 weeks (around 12/31/2020).  Lesleigh Noe, MD  This visit occurred during the SARS-CoV-2 public health emergency.  Safety protocols were in place, including screening questions prior to the visit, additional usage of staff PPE, and extensive cleaning of exam room while observing appropriate contact time as indicated for disinfecting solutions.

## 2020-11-19 NOTE — Assessment & Plan Note (Signed)
Symptoms present for ~1 month and in setting of high stress. Discussed therapy and advised - she is not interested at this time. Encouraged non-medication approaches. Trial of propranolol daily or prn bid for general anxiety. If ineffective will try hydroxyzine. Return 6-8 weeks if not improving and symptoms persisting. Pt with hx of anxiety/depression so high risk for relapse and needing SSRI management.

## 2020-11-20 MED ORDER — HYDROXYZINE HCL 50 MG PO TABS: 25.0000 mg | ORAL_TABLET | Freq: Three times a day (TID) | ORAL | 0 refills | Status: DC | PRN

## 2020-11-20 NOTE — Telephone Encounter (Signed)
Lenon Ahmadi from [facility] left v/m that the propranolol may affect the med pt takes for asthma. Lenon Ahmadi request cb. Dr Einar Pheasant is out of office  And will send note to Dr Einar Pheasant who is out of office, Alyse Low CMA and Dr Glori Bickers who is in office.

## 2020-11-20 NOTE — Telephone Encounter (Signed)
Please cancel propranolol  Hydroxyzine sent to pharmacy  mychart to patient

## 2020-11-23 ENCOUNTER — Telehealth: Payer: Self-pay

## 2020-11-23 NOTE — Telephone Encounter (Signed)
[facility] per Dr. Einar Pheasant.

## 2020-12-31 ENCOUNTER — Encounter: Payer: Self-pay | Admitting: Family Medicine

## 2021-01-18 ENCOUNTER — Ambulatory Visit: Payer: BC Managed Care – PPO | Admitting: Family Medicine

## 2021-02-06 ENCOUNTER — Other Ambulatory Visit: Payer: Self-pay | Admitting: Family Medicine

## 2021-02-06 DIAGNOSIS — E559 Vitamin D deficiency, unspecified: Secondary | ICD-10-CM

## 2021-03-03 DIAGNOSIS — D239 Other benign neoplasm of skin, unspecified: Secondary | ICD-10-CM | POA: Diagnosis not present

## 2021-03-03 DIAGNOSIS — L821 Other seborrheic keratosis: Secondary | ICD-10-CM | POA: Diagnosis not present

## 2021-03-03 DIAGNOSIS — L570 Actinic keratosis: Secondary | ICD-10-CM | POA: Diagnosis not present

## 2021-03-03 DIAGNOSIS — L814 Other melanin hyperpigmentation: Secondary | ICD-10-CM | POA: Diagnosis not present

## 2021-03-10 DIAGNOSIS — M25572 Pain in left ankle and joints of left foot: Secondary | ICD-10-CM | POA: Diagnosis not present

## 2021-03-22 ENCOUNTER — Other Ambulatory Visit: Payer: Self-pay | Admitting: Family Medicine

## 2021-03-22 DIAGNOSIS — E559 Vitamin D deficiency, unspecified: Secondary | ICD-10-CM

## 2021-03-23 ENCOUNTER — Other Ambulatory Visit: Payer: Self-pay

## 2021-03-23 ENCOUNTER — Ambulatory Visit: Payer: BC Managed Care – PPO | Admitting: Family Medicine

## 2021-03-23 DIAGNOSIS — E559 Vitamin D deficiency, unspecified: Secondary | ICD-10-CM | POA: Diagnosis not present

## 2021-03-23 DIAGNOSIS — R631 Polydipsia: Secondary | ICD-10-CM | POA: Diagnosis not present

## 2021-03-23 DIAGNOSIS — I10 Essential (primary) hypertension: Secondary | ICD-10-CM

## 2021-03-23 LAB — POCT GLYCOSYLATED HEMOGLOBIN (HGB A1C): Hemoglobin A1C: 5.2 % (ref 4.0–5.6)

## 2021-03-23 MED ORDER — CALCIUM CARBONATE+VITAMIN D 600-200 MG-UNIT PO TABS: 1.0000 | ORAL_TABLET | Freq: Two times a day (BID) | ORAL | 3 refills | Status: AC

## 2021-03-23 NOTE — Assessment & Plan Note (Signed)
BP at goal. Cont hctz 12.5 mg, lisinopril 20 mg. Discussed avoiding phentermine if possible due to cardiac risk

## 2021-03-23 NOTE — Patient Instructions (Addendum)
Here is what I would recommend:  1) Increase the amount of water you drink a day > specifically drink a glass of water 8 oz or more before every meal 2) Could start a fiber supplement (like metamucil) > You could take this up to 3 times a day, but I would start with 1 time a day until you get used to it. It may cause some stomach upset 3) Make sure you sit down to eat and eat slowly (cut meat one piece at a time) > specifically train your body to eat only at the table (avoiding snacking in front of the TV) 4) Fill up on healthy items first > consider eating a salad with low calorie salad dressing before every meal  5) Make 1/2 of your plate vegetables 6) Keep healthy snacks available for those times when you are bored 7) Consider using a calorie counting app like MyFitnessPal > counting calories helps you make wise choices around snacking. Or if you can afford it you could sign up for Weight Watchers -- and learn about healthy options through a point system    Check with insurance on weight loss medications  - ozempic/wegovy - semiglutide

## 2021-03-23 NOTE — Assessment & Plan Note (Signed)
C/b recent weight gain and polydipsia. Hemoglobin a1c in the normal range. Has not been exercising due to leg injury - discussed this could be contributing. Will check labs including TSH given polydipsia and weight gain. Encouraged calorie counting and nutrition referral. Has been on phentermine in the past and discussed I could prescribe weight loss medications if needed but would recommend life style changes first

## 2021-03-23 NOTE — Progress Notes (Signed)
Subjective:     Amy Watson is a 50 y.o. female presenting for Obesity (Steadily gaining weight even with better eating habits and lifestyle changes ) and Polydipsia (X 2-3 months )     HPI  #Obesity/weight gain - weight is up 10 lbs in 4 months - quit her part-time job and now getting more sleep - adding more veggies to diet - is using bagged salad  - has not been calorie counting - less fast-foods - exercise - has a sports injury with boot, has not been exercising for 2 weeks  - feels that this has not accelerated her weight gain - just steady - some intentional increased water, but also increased thirst - drinks 1/2 gallon of water per day  No orthopnea, no PND  Endorses worsening asthma SOB  Not sure about menopause - has spotting monthly still   Review of Systems  Constitutional:  Negative for chills and fever.  Respiratory:  Positive for shortness of breath (asthma is worse).   Cardiovascular:  Negative for leg swelling.  Gastrointestinal:  Negative for constipation, diarrhea, nausea and vomiting.  Endocrine: Positive for heat intolerance, polydipsia and polyuria.  Musculoskeletal:  Negative for arthralgias and myalgias.    Social History   Tobacco Use  Smoking Status Never  Smokeless Tobacco Never        Objective:    BP Readings from Last 3 Encounters:  03/23/21 128/80  11/19/20 140/80  07/30/20 130/80   Wt Readings from Last 3 Encounters:  03/23/21 239 lb 12 oz (108.7 kg)  11/19/20 228 lb 4 oz (103.5 kg)  07/30/20 224 lb 8 oz (101.8 kg)    BP 128/80   Pulse 95   Temp 98.5 F (36.9 C) (Temporal)   Wt 239 lb 12 oz (108.7 kg)   SpO2 98%   BMI 38.70 kg/m    Physical Exam Constitutional:      General: She is not in acute distress.    Appearance: She is well-developed. She is not diaphoretic.  HENT:     Right Ear: External ear normal.     Left Ear: External ear normal.     Nose: Nose normal.  Eyes:     Conjunctiva/sclera:  Conjunctivae normal.  Cardiovascular:     Rate and Rhythm: Normal rate and regular rhythm.     Heart sounds: No murmur heard. Pulmonary:     Effort: Pulmonary effort is normal. No respiratory distress.     Breath sounds: Normal breath sounds. No wheezing.  Abdominal:     General: Abdomen is flat. There is no distension.     Palpations: Abdomen is soft.     Tenderness: There is no abdominal tenderness.     Hernia: No hernia is present.  Musculoskeletal:     Cervical back: Neck supple.  Skin:    General: Skin is warm and dry.     Capillary Refill: Capillary refill takes less than 2 seconds.  Neurological:     Mental Status: She is alert. Mental status is at baseline.  Psychiatric:        Mood and Affect: Mood normal.        Behavior: Behavior normal.     Lab Results  Component Value Date   HGBA1C 5.2 03/23/2021        Assessment & Plan:   Problem List Items Addressed This Visit       Cardiovascular and Mediastinum   Hypertension    BP at goal. Cont hctz  12.5 mg, lisinopril 20 mg. Discussed avoiding phentermine if possible due to cardiac risk         Other   Morbid obesity ([place]) - Primary    C/b recent weight gain and polydipsia. Hemoglobin a1c in the normal range. Has not been exercising due to leg injury - discussed this could be contributing. Will check labs including TSH given polydipsia and weight gain. Encouraged calorie counting and nutrition referral. Has been on phentermine in the past and discussed I could prescribe weight loss medications if needed but would recommend life style changes first       Relevant Orders   Comprehensive metabolic panel   TSH   Ambulatory referral to Nutrition and Diabetic Education   Vitamin D deficiency   Relevant Medications   Calcium Carbonate+Vitamin D (CALCIUM 600+D3) 600-200 MG-UNIT TABS   Other Visit Diagnoses     Polydipsia       Relevant Orders   POCT glycosylated hemoglobin (Hb A1C) (Completed)         Return if symptoms worsen or fail to improve.  Lesleigh Noe, MD  This visit occurred during the SARS-CoV-2 public health emergency.  Safety protocols were in place, including screening questions prior to the visit, additional usage of staff PPE, and extensive cleaning of exam room while observing appropriate contact time as indicated for disinfecting solutions.

## 2021-03-24 ENCOUNTER — Encounter: Payer: Self-pay | Admitting: Family Medicine

## 2021-03-24 LAB — COMPREHENSIVE METABOLIC PANEL
ALT: 18 U/L (ref 0–35)
AST: 15 U/L (ref 0–37)
Albumin: 4.4 g/dL (ref 3.5–5.2)
Alkaline Phosphatase: 66 U/L (ref 39–117)
BUN: 8 mg/dL (ref 6–23)
CO2: 26 mEq/L (ref 19–32)
Calcium: 9.6 mg/dL (ref 8.4–10.5)
Chloride: 100 mEq/L (ref 96–112)
Creatinine, Ser: 0.63 mg/dL (ref 0.40–1.20)
GFR: 102.74 mL/min (ref >60.00)
Glucose, Bld: 84 mg/dL (ref 70–99)
Potassium: 4.3 mEq/L (ref 3.5–5.1)
Sodium: 136 mEq/L (ref 135–145)
Total Bilirubin: 0.8 mg/dL (ref 0.2–1.2)
Total Protein: 7.2 g/dL (ref 6.0–8.3)

## 2021-03-24 LAB — TSH: TSH: 1.93 u[IU]/mL (ref 0.35–5.50)

## 2021-03-31 DIAGNOSIS — M13872 Other specified arthritis, left ankle and foot: Secondary | ICD-10-CM | POA: Diagnosis not present

## 2021-04-06 ENCOUNTER — Encounter: Payer: Self-pay | Admitting: Family Medicine

## 2021-04-26 DIAGNOSIS — N926 Irregular menstruation, unspecified: Secondary | ICD-10-CM | POA: Diagnosis not present

## 2021-04-26 DIAGNOSIS — R635 Abnormal weight gain: Secondary | ICD-10-CM | POA: Diagnosis not present

## 2021-04-26 DIAGNOSIS — Z6837 Body mass index (BMI) 37.0-37.9, adult: Secondary | ICD-10-CM | POA: Diagnosis not present

## 2021-05-03 ENCOUNTER — Ambulatory Visit: Payer: BC Managed Care – PPO | Admitting: Family Medicine

## 2021-05-03 ENCOUNTER — Other Ambulatory Visit: Payer: Self-pay

## 2021-05-03 VITALS — BP 128/80 | HR 88 | Temp 97.2°F | Ht 66.0 in | Wt 234.1 lb

## 2021-05-03 DIAGNOSIS — R058 Other specified cough: Secondary | ICD-10-CM | POA: Diagnosis not present

## 2021-05-03 DIAGNOSIS — J4540 Moderate persistent asthma, uncomplicated: Secondary | ICD-10-CM

## 2021-05-03 DIAGNOSIS — B370 Candidal stomatitis: Secondary | ICD-10-CM | POA: Diagnosis not present

## 2021-05-03 DIAGNOSIS — J4530 Mild persistent asthma, uncomplicated: Secondary | ICD-10-CM | POA: Diagnosis not present

## 2021-05-03 DIAGNOSIS — J309 Allergic rhinitis, unspecified: Secondary | ICD-10-CM | POA: Insufficient documentation

## 2021-05-03 MED ORDER — PREDNISONE 20 MG PO TABS: ORAL_TABLET | ORAL | 0 refills | Status: AC

## 2021-05-03 MED ORDER — NYSTATIN 100000 UNIT/ML MT SUSP: 5.0000 mL | Freq: Four times a day (QID) | OROMUCOSAL | 0 refills | Status: DC

## 2021-05-03 NOTE — Patient Instructions (Signed)
Take prednisone - if better after 4-5 days can stop early  Thrush - treat for 2 days beyond symptom improvement - if symptoms quickly return call and can consider either touching base with asthma doctor or even ENT

## 2021-05-03 NOTE — Assessment & Plan Note (Signed)
Discussed oral hygiene. Nystatin mouth wash. F/u if not improving.

## 2021-05-03 NOTE — Progress Notes (Signed)
Subjective:     Amy Watson is a 50 y.o. female presenting for Cough and Thrush     Cough Pertinent negatives include no chills, fever or shortness of breath.   #Cold - 3 weeks ago - typical for her - the cough will not go away and will need prednisone - wheezing has improved - the cough is persisting with talking or deep breathing - in the past responds to prednisone  - still productive phelgm - but mostly dry - negative covid test  #Thrush - last episode was after using Breo - did use her nebulizer - uncomfortable mouth lesions - impacts taste - sour/bitter taste to mouth   Review of Systems  Constitutional:  Negative for chills and fever.  HENT:  Negative for congestion.   Respiratory:  Positive for cough. Negative for shortness of breath.     Social History   Tobacco Use  Smoking Status Never  Smokeless Tobacco Never        Objective:    BP Readings from Last 3 Encounters:  05/03/21 128/80  03/23/21 128/80  11/19/20 140/80   Wt Readings from Last 3 Encounters:  05/03/21 234 lb 2 oz (106.2 kg)  03/23/21 239 lb 12 oz (108.7 kg)  11/19/20 228 lb 4 oz (103.5 kg)    BP 128/80   Pulse 88   Temp (!) 97.2 F (36.2 C) (Temporal)   Ht '5\' 6"'$  (1.676 m)   Wt 234 lb 2 oz (106.2 kg)   SpO2 98%   BMI 37.79 kg/m    Physical Exam Constitutional:      General: She is not in acute distress.    Appearance: She is well-developed. She is not diaphoretic.  HENT:     Right Ear: Tympanic membrane and external ear normal.     Left Ear: Tympanic membrane and external ear normal.     Nose: Nose normal.     Mouth/Throat:     Mouth: Mucous membranes are moist.     Comments: White plaques on tongue and right posterior upper mouth Eyes:     Conjunctiva/sclera: Conjunctivae normal.  Cardiovascular:     Rate and Rhythm: Normal rate and regular rhythm.     Heart sounds: No murmur heard. Pulmonary:     Effort: Pulmonary effort is normal. No respiratory  distress.     Breath sounds: Normal breath sounds. No wheezing or rales.     Comments: Coughing occasionally Musculoskeletal:     Cervical back: Neck supple.  Skin:    General: Skin is warm and dry.     Capillary Refill: Capillary refill takes less than 2 seconds.  Neurological:     Mental Status: She is alert. Mental status is at baseline.  Psychiatric:        Mood and Affect: Mood normal.        Behavior: Behavior normal.          Assessment & Plan:   Problem List Items Addressed This Visit       Respiratory   Mild persistent asthma, uncomplicated    Persistent symptoms following cold. Cont Breo and prn albuterol. Start prednisone - if symptoms resolve w/in 4-5 days advised stopping. If persisting complete 12 day course.       Relevant Medications   predniSONE (DELTASONE) 20 MG tablet   fluticasone furoate-vilanterol (BREO ELLIPTA) 200-25 MCG/INH AEPB     Digestive   Oral thrush - Primary    Discussed oral hygiene. Nystatin mouth wash.  F/u if not improving.       Relevant Medications   nystatin (MYCOSTATIN) 100000 UNIT/ML suspension   Other Visit Diagnoses     Post-viral cough syndrome       Relevant Medications   predniSONE (DELTASONE) 20 MG tablet   Moderate persistent asthma without complication       Relevant Medications   predniSONE (DELTASONE) 20 MG tablet   fluticasone furoate-vilanterol (BREO ELLIPTA) 200-25 MCG/INH AEPB        Return if symptoms worsen or fail to improve.  Lesleigh Noe, MD  This visit occurred during the SARS-CoV-2 public health emergency.  Safety protocols were in place, including screening questions prior to the visit, additional usage of staff PPE, and extensive cleaning of exam room while observing appropriate contact time as indicated for disinfecting solutions.

## 2021-05-03 NOTE — Assessment & Plan Note (Signed)
Persistent symptoms following cold. Cont Breo and prn albuterol. Start prednisone - if symptoms resolve w/in 4-5 days advised stopping. If persisting complete 12 day course.

## 2021-06-03 ENCOUNTER — Other Ambulatory Visit: Payer: Self-pay

## 2021-06-03 ENCOUNTER — Encounter: Payer: Self-pay | Admitting: Dietician

## 2021-06-03 ENCOUNTER — Encounter: Payer: BC Managed Care – PPO | Attending: Family Medicine | Admitting: Dietician

## 2021-06-03 VITALS — Ht 66.0 in | Wt 237.6 lb

## 2021-06-03 DIAGNOSIS — E669 Obesity, unspecified: Secondary | ICD-10-CM | POA: Diagnosis not present

## 2021-06-03 DIAGNOSIS — Z6838 Body mass index (BMI) 38.0-38.9, adult: Secondary | ICD-10-CM

## 2021-06-03 NOTE — Patient Instructions (Addendum)
Continue to work on smaller food portions, especially of starchy foods. Fill 1/2 the plate with vegetables, to limit space for the starch.  Include water or sugar free flavored water before and/or during meals to help with fulness.  Minimize any added fats in foods like butter, oils, mayonnaise, salad dressing. Make homemade meals more conducive by eating in car, finding relaxing places to eat.

## 2021-06-03 NOTE — Progress Notes (Signed)
Medical Nutrition Therapy: Visit start time: 3825  end time: 1545  Assessment:  Diagnosis: obesity Past medical history: hypertension Psychosocial issues/ stress concerns: high stress level, work related; uses breathing exercises to manage stress  Preferred learning method:  Hands-on, note taking   Current weight: 237.6lbs Height: 5'6" BMI: 38.35 Medications, supplements: reconciled list in medical record  Progress and evaluation:  Pateint reports working to emphasize vegetables, protein foods and more water in her diet.  Has lost some weight, but having a difficult time at work with low staff, working longer hours -- which makes maintaining healthy habits more difficult.  Loves pizza and burgers, simple meals. Some large portions she is working  to decrease.   Feels she needs to work on portions further and healthier food choices. She reports often bringing healthy food to work for lunch and then avoiding it and going out to eat. Getting away from the office might be the issue per patient.  Physical activity: walking 30 minutes, sporadically  Dietary Intake:  Usual eating pattern includes 2-3 meals and 0 snacks per day. Dining out frequency: 4 meals per week.  Breakfast: "Just crack an egg" bowl; 2-3x a week sometimes skips if not hungry Snack: none Lunch: salad; taco bell; chinese (stopped seconds on buffet);  Snack: none; trail mix with nuts, sesame sticks; sm portion pistachios with salt and pepper Supper: 6-7pm hamburger helper with salad (bagged salads often); pulled chicken; teriyaki; occ sushi from [employer]; burger patty and rice; chicken an broccoli, cream of chicken soup with added rice; pork bbq sandwich and 3 hush puppies; [employer] 3pc dark meat with mashed pot and mac and cheese/ fries Snack: none Beverages: water 4-5 glasses daily; keeps in mini fridge under desk at work; infused water at home; rarely soda at work; 1/2 -1c coffee in am with 1 tsp sugar and heavy  cream  Nutrition Care Education: Topics covered:  Basic nutrition: basic food groups, appropriate nutrient balance, appropriate meal and snack schedule, general nutrition guidelines    Weight control: importance of low sugar and low fat choices; portion control strategies; estimated energy needs for weight loss at 1400 kcal, provided guidance for 45% CHO, 25% pro, 30% fat; controlling hunger and appetite with low carb veg., high fiber foods; effects of stress on hunger and appetite and importance managing stress; strategies to promote healthier food choices rather than fast foods for lunches  Nutritional Diagnosis:  [place]-3.3 Overweight/obesity As related to excess calories, inadequate physical activity, stress.  As evidenced by patinet with current BMI of 38.35, working on diet and lifestyle changes to promote weight loss and reduce health risk.  Intervention:  Instruction and discussion as noted above. Patient has begun making dietary improvements, and is motivated to continue.  High stress at work adds challenge to making and maintaining healthy changes. Established nutrition goals with direction from patient.  Education Materials given:  Museum/gallery conservator with food lists, sample meal pattern [employer] Tips for Managing Food Cravings Visit summary with goals/ instructions   Learner/ who was taught:  Patient    Level of understanding: Verbalizes/ demonstrates competency   Demonstrated degree of understanding via:   Teach back Learning barriers: None  Willingness to learn/ readiness for change: Eager, change in progress  Monitoring and Evaluation:  Dietary intake, exercise, BP control, and body weight      follow up:  07/22/21 at 4:30pm

## 2021-06-24 ENCOUNTER — Ambulatory Visit (INDEPENDENT_AMBULATORY_CARE_PROVIDER_SITE_OTHER): Payer: BC Managed Care – PPO | Admitting: Advanced Practice Midwife

## 2021-06-24 ENCOUNTER — Encounter: Payer: Self-pay | Admitting: Advanced Practice Midwife

## 2021-06-24 ENCOUNTER — Other Ambulatory Visit: Payer: Self-pay

## 2021-06-24 VITALS — BP 139/84 | HR 96 | Ht 66.0 in | Wt 236.0 lb

## 2021-06-24 DIAGNOSIS — Z01419 Encounter for gynecological examination (general) (routine) without abnormal findings: Secondary | ICD-10-CM

## 2021-06-24 DIAGNOSIS — J301 Allergic rhinitis due to pollen: Secondary | ICD-10-CM | POA: Diagnosis not present

## 2021-06-24 DIAGNOSIS — R32 Unspecified urinary incontinence: Secondary | ICD-10-CM | POA: Diagnosis not present

## 2021-06-24 DIAGNOSIS — J4530 Mild persistent asthma, uncomplicated: Secondary | ICD-10-CM | POA: Diagnosis not present

## 2021-06-24 DIAGNOSIS — Z0000 Encounter for general adult medical examination without abnormal findings: Secondary | ICD-10-CM

## 2021-06-24 DIAGNOSIS — J3089 Other allergic rhinitis: Secondary | ICD-10-CM | POA: Diagnosis not present

## 2021-06-24 NOTE — Progress Notes (Signed)
Patient presents for Annual Exam.  Last Pap: 03/07/2017 WNL Mammogram:05/04/20 WNL  Family Hx of Breast Cancer:Mother  Family Hx of Ovarian Cancer: None   Wants to discuss urinary incontinence.

## 2021-06-25 NOTE — Progress Notes (Signed)
GYNECOLOGY ANNUAL PREVENTATIVE CARE ENCOUNTER NOTE  History:     Amy Watson is a 50 y.o. G2P2 female here for a routine annual gynecologic exam.  Current complaints: New onset urinary incontinence. Chronic concern but worsening in the past few months. Patient endorses multiple mild illnesses which caused recurrent cough, urinary incontinence coincides with episodes of coughing. She also reports 15 lb weight gain during that time which she feels is a contributing factor. Denies abnormal vaginal bleeding, discharge, pelvic pain, problems with intercourse or other gynecologic concerns.   Not sexually active. Verbalizes understanding she is due for pap 02/2022 but declines having it performed today.   Gynecologic History No LMP recorded. (Menstrual status: Perimenopausal). Contraception:  perimenopausal  and abstinent Last Pap: 02/2017. Result was normal with negative HPV Last Mammogram: 10/2018.  Result was normal   Obstetric History OB History  Gravida Para Term Preterm AB Living  2 2       2   SAB IAB Ectopic Multiple Live Births          2    # Outcome Date GA Lbr Len/2nd Weight Sex Delivery Anes PTL Lv  2 Para         LIV  1 Para         LIV    Past Medical History:  Diagnosis Date   Allergy    Asthma    Hypertension    Obesity     Past Surgical History:  Procedure Laterality Date   FOOT SURGERY     FOOT SURGERY     NECK SURGERY      Current Outpatient Medications on File Prior to Visit  Medication Sig Dispense Refill   cetirizine (ZYRTEC) 10 MG tablet Take 10 mg by mouth as needed.      Cholecalciferol (VITAMIN D3) 1.25 MG (50000 UT) CAPS TAKE 1 TABLET BY MOUTH ONCE A WEEK. 4 capsule 0   fluticasone furoate-vilanterol (BREO ELLIPTA) 200-25 MCG/INH AEPB Inhale 1 puff into the lungs daily.     hydrochlorothiazide (HYDRODIURIL) 12.5 MG tablet Take 12.5 mg by mouth daily.     lisinopril (PRINIVIL,ZESTRIL) 20 MG tablet Take 20 mg by mouth daily.      montelukast (SINGULAIR) 10 MG tablet Take by mouth as needed.     naproxen sodium (ALEVE) 220 MG tablet Take by mouth.     ALBUTEROL IN Inhale into the lungs.     Calcium Carbonate+Vitamin D (CALCIUM 600+D3) 600-200 MG-UNIT TABS Take 1 tablet by mouth in the morning and at bedtime. 180 tablet 3   meloxicam (MOBIC) 7.5 MG tablet Take 7.5 mg by mouth as needed.  (Patient not taking: No sig reported)     nystatin (MYCOSTATIN) 100000 UNIT/ML suspension Take 5 mLs (500,000 Units total) by mouth 4 (four) times daily. 473 mL 0   omeprazole (PRILOSEC OTC) 20 MG tablet Take 1 tablet (20 mg total) daily by mouth. (Patient taking differently: Take 20 mg by mouth as needed.) 28 tablet 1   phentermine 15 MG capsule Take 15 mg by mouth every morning.     PROAIR HFA 108 (90 Base) MCG/ACT inhaler TAKE 1 2 PUFFS AS NEEDED EVERY 4 6 HOURS AS NEEDED FOR COUGH/WHEEZE     topiramate (TOPAMAX) 25 MG tablet PLEASE SEE ATTACHED FOR DETAILED DIRECTIONS     No current facility-administered medications on file prior to visit.    Allergies  Allergen Reactions   Azithromycin Other (See Comments)    C. Diff./  Pain     Social History:  reports that she has never smoked. She has never used smokeless tobacco. She reports current alcohol use. She reports that she does not use drugs.  Family History  Problem Relation Age of Onset   Breast cancer Mother 3   Diabetes Father    Hypertension Father    Cancer Maternal Grandfather        skin    The following portions of the patient's history were reviewed and updated as appropriate: allergies, current medications, past family history, past medical history, past social history, past surgical history and problem list.  Review of Systems Pertinent items noted in HPI and remainder of comprehensive ROS otherwise negative.  Physical Exam:  BP 139/84   Pulse 96   Ht 5\' 6"  (1.676 m)   Wt 236 lb (107 kg)   BMI 38.09 kg/m  CONSTITUTIONAL: Well-developed, well-nourished  female in no acute distress.  HENT:  Normocephalic, atraumatic, External right and left ear normal.  EYES: Conjunctivae and EOM are normal. Pupils are equal, round, and reactive to light. No scleral icterus.  NECK: Normal range of motion, supple, no masses.  Normal thyroid.  SKIN: Skin is warm and dry. No rash noted. Not diaphoretic. No erythema. No pallor. MUSCULOSKELETAL: Normal range of motion. No tenderness.  No cyanosis, clubbing, or edema. NEUROLOGIC: Alert and oriented to person, place, and time. Normal reflexes, muscle tone coordination.  PSYCHIATRIC: Normal mood and affect. Normal behavior. Normal judgment and thought content. CARDIOVASCULAR: Normal heart rate noted, regular rhythm RESPIRATORY: Clear to auscultation bilaterally. Effort and breath sounds normal, no problems with respiration noted. BREASTS: Symmetric in size. No masses, tenderness, skin changes, nipple drainage, or lymphadenopathy bilaterally. Performed in the presence of a chaperone. ABDOMEN: Soft, no distention noted.  No tenderness, rebound or guarding.  PELVIC: Deferred per patient request   Assessment and Plan:    1. Well woman exam without gynecological exam - Declines pap today due to visiting relative  - Pap due 02/2022 - Has PCP - [facility] 3D SCREEN BREAST BILATERAL; Future   2. Urinary incontinence in female - Advised toileting schedule, reduce or eliminate carbonated beverages - Ambulatory referral to Pelvic Floor Physical Therapy   Mammogram scheduled Routine preventative health maintenance measures emphasized. Please refer to After Visit Summary for other counseling recommendations.   Total visit time: 30 min. Greater than 50% of visit spent in counseling and coordination of care.  Mallie Snooks, [place], MSN, CNM Certified Nurse Midwife, Product/process development scientist for [employer], [place]

## 2021-07-01 DIAGNOSIS — L570 Actinic keratosis: Secondary | ICD-10-CM | POA: Diagnosis not present

## 2021-07-01 DIAGNOSIS — L578 Other skin changes due to chronic exposure to nonionizing radiation: Secondary | ICD-10-CM | POA: Diagnosis not present

## 2021-07-01 DIAGNOSIS — D239 Other benign neoplasm of skin, unspecified: Secondary | ICD-10-CM | POA: Diagnosis not present

## 2021-07-01 DIAGNOSIS — Z872 Personal history of diseases of the skin and subcutaneous tissue: Secondary | ICD-10-CM | POA: Diagnosis not present

## 2021-07-12 ENCOUNTER — Ambulatory Visit: Payer: BC Managed Care – PPO | Admitting: Physical Therapy

## 2021-07-22 ENCOUNTER — Ambulatory Visit: Payer: BC Managed Care – PPO | Admitting: Dietician

## 2021-07-28 ENCOUNTER — Ambulatory Visit
Admission: RE | Admit: 2021-07-28 | Discharge: 2021-07-28 | Disposition: A | Discharge status: Home / Self Care | Payer: BC Managed Care – PPO | Source: Ambulatory Visit | Attending: Advanced Practice Midwife | Admitting: Advanced Practice Midwife

## 2021-07-28 ENCOUNTER — Encounter: Payer: BC Managed Care – PPO | Admitting: Physical Therapy

## 2021-07-28 ENCOUNTER — Other Ambulatory Visit: Payer: Self-pay

## 2021-07-28 DIAGNOSIS — Z0000 Encounter for general adult medical examination without abnormal findings: Secondary | ICD-10-CM

## 2021-07-28 DIAGNOSIS — Z1231 Encounter for screening mammogram for malignant neoplasm of breast: Secondary | ICD-10-CM | POA: Diagnosis not present

## 2021-08-03 ENCOUNTER — Encounter: Payer: BC Managed Care – PPO | Admitting: Physical Therapy

## 2021-08-18 ENCOUNTER — Encounter: Payer: BC Managed Care – PPO | Admitting: Physical Therapy

## 2021-08-18 ENCOUNTER — Encounter: Payer: Self-pay | Admitting: Dietician

## 2021-08-18 NOTE — Progress Notes (Signed)
Patient cancelled her [facility] follow-up appointment on 07/22/21 and did not reschedule. Sent notification to referring provider.

## 2021-08-25 ENCOUNTER — Encounter: Payer: BC Managed Care – PPO | Admitting: Physical Therapy

## 2021-08-31 ENCOUNTER — Encounter: Payer: BC Managed Care – PPO | Admitting: Physical Therapy

## 2021-09-08 ENCOUNTER — Encounter: Payer: BC Managed Care – PPO | Admitting: Physical Therapy

## 2021-09-21 ENCOUNTER — Telehealth: Payer: Self-pay | Admitting: Family Medicine

## 2021-09-21 NOTE — Telephone Encounter (Signed)
The patient is requesting to switch providers from Dr. Waunita Schooner to [facility].

## 2021-09-21 NOTE — Telephone Encounter (Signed)
That is OK with me

## 2021-09-27 NOTE — Telephone Encounter (Signed)
Pt has been scheduled for 2/15

## 2021-09-29 ENCOUNTER — Ambulatory Visit: Payer: BC Managed Care – PPO | Admitting: Family

## 2021-09-29 ENCOUNTER — Encounter: Payer: Self-pay | Admitting: Family

## 2021-09-29 ENCOUNTER — Other Ambulatory Visit: Payer: Self-pay

## 2021-09-29 VITALS — BP 128/84 | HR 86 | Ht 66.0 in | Wt 237.0 lb

## 2021-09-29 DIAGNOSIS — I10 Essential (primary) hypertension: Secondary | ICD-10-CM

## 2021-09-29 DIAGNOSIS — R350 Frequency of micturition: Secondary | ICD-10-CM | POA: Insufficient documentation

## 2021-09-29 DIAGNOSIS — M19041 Primary osteoarthritis, right hand: Secondary | ICD-10-CM

## 2021-09-29 DIAGNOSIS — J309 Allergic rhinitis, unspecified: Secondary | ICD-10-CM

## 2021-09-29 DIAGNOSIS — M19042 Primary osteoarthritis, left hand: Secondary | ICD-10-CM

## 2021-09-29 DIAGNOSIS — J4530 Mild persistent asthma, uncomplicated: Secondary | ICD-10-CM | POA: Diagnosis not present

## 2021-09-29 DIAGNOSIS — F4323 Adjustment disorder with mixed anxiety and depressed mood: Secondary | ICD-10-CM

## 2021-09-29 LAB — POC URINALSYSI DIPSTICK (AUTOMATED)
Bilirubin, UA: NEGATIVE
Glucose, UA: NEGATIVE
Ketones, UA: NEGATIVE
Leukocytes, UA: NEGATIVE
Nitrite, UA: NEGATIVE
Protein, UA: NEGATIVE
Spec Grav, UA: 1.025 (ref 1.010–1.025)
Urobilinogen, UA: 0.2 E.U./dL
pH, UA: 5.5 (ref 5.0–8.0)

## 2021-09-29 NOTE — Assessment & Plan Note (Signed)
Pt advised of the following: Double hydrochlorothiazide to 25 mg once daily also sending medication refill for this.  Also continue lisinopril as prescribed.  Advised patient to buy her own blood pressure machine for at home for at home blood pressure monitoring and bring the log with her to her next visit. Goal is <130/90 on average. Ensure that you have rested for 30 minutes prior to checking your blood pressure. Record your readings and bring them to your next visit if necessary.work on a low sodium diet.  I did advise her that I do not necessarily want her to continue with phentermine however she is getting this from a weight loss clinic and states that she does not want to stop this right now.  I did advise her that it may be increasing her blood pressure and making her feel more anxiety.

## 2021-09-29 NOTE — Patient Instructions (Addendum)
Start lexapro 10 mg once daily for anxiety and depression. Take 1/2 tablet by mouth once daily for about one week, then increase to 1 full tablet thereafter.   Taking the medicine as directed and not missing any doses is one of the best things you can do to treat your anxiety, Here are some things to keep in mind:  Side effects (stomach upset, some increased anxiety) may happen before you notice a benefit.  These side effects typically go away over time. Changes to your dose of medicine or a change in medication all together is sometimes necessary Many people will notice an improvement within two weeks but the full effect of the medication can take up to 4-6 weeks Stopping the medication when you start feeling better often results in a return of symptoms. Most people need to be on medication at least 6-12 months If you start having thoughts of hurting yourself or others after starting this medicine, please call me immediately.    Buy your blood pressure machine, and Start monitoring your blood pressure daily, around the same time of day, for the next 2-3 weeks.  Ensure that you have rested for 30 minutes prior to checking your blood pressure. Record your readings and bring them to your next visit.  I would suggest you increase your HCTZ to 25 mg once daily.   It was a pleasure seeing you today! Please do not hesitate to reach out with any questions and or concerns.  Regards,   [name] FNP-C

## 2021-09-29 NOTE — Assessment & Plan Note (Signed)
Continue Zyrtec as needed 

## 2021-09-29 NOTE — Assessment & Plan Note (Signed)
Patient chooses to continue with weight loss management center however I do feel that some of her medications may be interfering with some of her symptoms of increased anxiety as well as blood pressure fluctuations especially at work.  Patient to follow-up with weight loss management center and discussed with them

## 2021-09-29 NOTE — Assessment & Plan Note (Signed)
We will rule out urinary tract infection with POCT dip in office today as well as urine culture.  Pending results.  I do feel this is most likely to increase oral hydration with water as well as hydrochlorothiazide as this can increase the frequency.

## 2021-09-29 NOTE — Assessment & Plan Note (Signed)
Meloxicam as needed however advised patient that this can also cause sodium retention and increase her lower leg extremity.

## 2021-09-29 NOTE — Assessment & Plan Note (Signed)
Patient to continue with Breo daily as this is helping her with her asthma and it appears to be well controlled.

## 2021-09-29 NOTE — Progress Notes (Signed)
Established Patient Office Visit  Subjective:  Patient ID: Amy Watson [place], female    DOB: Feb 15, 1971  Age: 51 y.o. MRN: [id number]  CC:  Chief Complaint  Patient presents with   Hypertension    Pt stated taking lisinopril but still have high blood pressure.    HPI Amy Watson is here for a transition of care visit.  Prior provider was: Dr. Waunita Schooner Pt is with acute concerns.   HTN: at work she has taken her blood pressure a few times because she has not felt 'that great' both times blood pressure was elevated. She does state increased anxiety at work as well. She tries to get herself to relax and breath and this helps by relaxing. Denies swelling in lower legs, does wear compression socks at times at work as well which helps. She does sit about 10 minutes prior to checking her blood pressure.  2/7: 149/87 2/8: 148/101  Also noticed about two weeks ago bil flank pain that comes and goes, when the pain occurs it is a squeezing uncomfortable sensation that lasts for a few minutes, and then goes away. She denies dysuria, she does have urinary frequency but she drinks a lot of water, no urgency. No vaginal discharge. No lower abdominal pain.   chronic concerns:  Asthma: taking breo daily, states not necessary shortness of breath. Not having to use her emergency inhaler.   Anxiety/depression: pt states increased anxiety and hard to get through work. Denies SI or HI. Has tried prozac in the past but it made her fall asleep often.   Obesity: has restarted on phentermine 15 mg capsule once daily along with topamax 25 mg once daily. She follows with [facility] for weight loss management.   GAD 7 : Generalized Anxiety Score 09/29/2021 11/19/2020 05/06/2020  Nervous, Anxious, on Edge 3 3 0  Control/stop worrying 0 3 1  Worry too much - different things 1 3 2   Trouble relaxing 2 3 0  Restless 2 2 0  Easily annoyed or irritable 3 3 1   Afraid - awful might happen 0 3 0   Total GAD 7 Score 11 20 4   Anxiety Difficulty - Extremely difficult Very difficult      Past Medical History:  Diagnosis Date   Allergy    Asthma    Hypertension    Obesity     Past Surgical History:  Procedure Laterality Date   FOOT SURGERY     FOOT SURGERY     NECK SURGERY      Family History  Problem Relation Age of Onset   Breast cancer Mother 76   Diabetes Father    Hypertension Father    Cancer Maternal Grandfather        skin    Social History   Socioeconomic History   Marital status: Divorced    Spouse name: Not on file   Number of children: 2   Years of education: some college   Highest education level: Not on file  Occupational History   Not on file  Tobacco Use   Smoking status: Never   Smokeless tobacco: Never  Vaping Use   Vaping Use: Never used  Substance and Sexual Activity   Alcohol use: Yes    Comment: 1 per week   Drug use: No   Sexual activity: Not Currently  Other Topics Concern   Not on file  Social History Narrative   05/06/20   From: [place]   Living: oldest daughter (  Watson Amy Watson)   Work: Psychologist, 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[employer]      Family: 2 children - Engineer, water and Amy Watson      Enjoys: read, yardwork      Exercise: parttime job - steps at work   Diet: working with obesity specialist to try to reduce appetite      Safety   Seat belts: Yes    Guns: Yes  and secure   Safe in relationships: Yes    Social Determinants of Radio broadcast assistant Strain: Not on file  Food Insecurity: Not on file  Transportation Needs: Not on file  Physical Activity: Not on file  Stress: Not on file  Social Connections: Not on file  Intimate Partner Violence: Not on file    Outpatient Medications Prior to Visit  Medication Sig Dispense Refill   Calcium Carbonate+Vitamin D (CALCIUM 600+D3) 600-200 MG-UNIT TABS Take 1 tablet by mouth in the morning and at bedtime. 180 tablet 3   cetirizine (ZYRTEC) 10 MG tablet Take 10 mg by mouth as needed.       Cholecalciferol (VITAMIN D3) 1.25 MG (50000 UT) CAPS TAKE 1 TABLET BY MOUTH ONCE A WEEK. 4 capsule 0   fluticasone furoate-vilanterol (BREO ELLIPTA) 200-25 MCG/INH AEPB Inhale 1 puff into the lungs daily.     hydrochlorothiazide (HYDRODIURIL) 12.5 MG tablet Take 12.5 mg by mouth daily.     lisinopril (PRINIVIL,ZESTRIL) 20 MG tablet Take 20 mg by mouth daily.     meloxicam (MOBIC) 7.5 MG tablet Take 7.5 mg by mouth as needed.     montelukast (SINGULAIR) 10 MG tablet Take by mouth as needed.     naproxen sodium (ALEVE) 220 MG tablet Take by mouth.     phentermine 15 MG capsule Take 15 mg by mouth every morning.     PROAIR HFA 108 (90 Base) MCG/ACT inhaler TAKE 1 2 PUFFS AS NEEDED EVERY 4 6 HOURS AS NEEDED FOR COUGH/WHEEZE     topiramate (TOPAMAX) 25 MG tablet PLEASE SEE ATTACHED FOR DETAILED DIRECTIONS     ALBUTEROL IN Inhale into the lungs.     nystatin (MYCOSTATIN) 100000 UNIT/ML suspension Take 5 mLs (500,000 Units total) by mouth 4 (four) times daily. 473 mL 0   omeprazole (PRILOSEC OTC) 20 MG tablet Take 1 tablet (20 mg total) daily by mouth. (Patient taking differently: Take 20 mg by mouth as needed.) 28 tablet 1   No facility-administered medications prior to visit.    Allergies  Allergen Reactions   Prozac [Fluoxetine] Other (See Comments)    fatigue   Azithromycin Other (See Comments)    C. Diff./  Pain     ROS Review of Systems  Constitutional:  Negative for chills and fatigue.  Respiratory:  Negative for cough and shortness of breath.   Cardiovascular:  Positive for leg swelling. Negative for chest pain and palpitations.  Gastrointestinal:  Negative for diarrhea and nausea.  Genitourinary:  Positive for flank pain and frequency. Negative for difficulty urinating, menstrual problem, pelvic pain, urgency, vaginal bleeding and vaginal discharge. Genital sores: bil. Musculoskeletal:  Positive for arthralgias. Back pain: right lower buttock pain with occasional sciatica  intermttent. Neurological:  Negative for dizziness, tremors, weakness, light-headedness, numbness and headaches.  Psychiatric/Behavioral:  Negative for agitation and sleep disturbance.   All other systems reviewed and are negative.  Review of Systems  Respiratory:  Negative for shortness of breath.   Cardiovascular:  Negative for chest pain and palpitations.  Gastrointestinal:  Negative for constipation and diarrhea.  Genitourinary:  Negative for dysuria, frequency and urgency.  Musculoskeletal:  Negative for myalgias.  Psychiatric/Behavioral:  Negative for depression and suicidal ideas.   All other systems reviewed and are negative.    Objective:    Physical Exam Vitals reviewed.  Constitutional:      General: She is not in acute distress.    Appearance: Normal appearance. She is obese. She is not ill-appearing, toxic-appearing or diaphoretic.  HENT:     Head: Normocephalic.     Mouth/Throat:     Pharynx: No pharyngeal swelling.     Tonsils: No tonsillar exudate.  Neck:     Thyroid: No thyroid mass.  Cardiovascular:     Rate and Rhythm: Normal rate and regular rhythm.  Pulmonary:     Effort: Pulmonary effort is normal.     Breath sounds: Normal breath sounds.  Abdominal:     General: Abdomen is flat. Bowel sounds are normal.     Palpations: Abdomen is soft.  Musculoskeletal:        General: Normal range of motion.     Right lower leg: 1+ Edema present.     Left lower leg: 1+ Edema present.     Comments: No costevertebral tenderness on palpation  Lymphadenopathy:     Cervical:     Right cervical: No superficial cervical adenopathy.    Left cervical: No superficial cervical adenopathy.  Skin:    General: Skin is warm.     Capillary Refill: Capillary refill takes less than 2 seconds.  Neurological:     General: No focal deficit present.     Mental Status: She is alert and oriented to person, place, and time.  Psychiatric:        Mood and Affect: Mood normal.         Behavior: Behavior normal.        Thought Content: Thought content normal.        Judgment: Judgment normal.    Gen: NAD, resting comfortably CV: RRR with no murmurs appreciated Pulm: NWOB, CTAB with no crackles, wheezes, or rhonchi Skin: warm, dry Psych: Normal affect and thought content  BP 128/84    Pulse 86    Ht 5\' 6"  (1.676 m)    Wt 237 lb (107.5 kg)    SpO2 97%    BMI 38.25 kg/m  Wt Readings from Last 3 Encounters:  09/29/21 237 lb (107.5 kg)  06/24/21 236 lb (107 kg)  06/03/21 237 lb 9.6 oz (107.8 kg)     Health Maintenance Due  Topic Date Due   Hepatitis C Screening  Never done   COLONOSCOPY (Pts 45-33yrs Insurance coverage will need to be confirmed)  Never done   Zoster Vaccines- Shingrix (1 of 2) Never done   COVID-19 Vaccine (4 - Booster for Pfizer series) 08/11/2020   INFLUENZA VACCINE  03/15/2021    There are no preventive care reminders to display for this patient.  Lab Results  Component Value Date   TSH 1.93 03/23/2021   Lab Results  Component Value Date   WBC 8.6 07/17/2019   HGB 14.5 07/17/2019   HCT 42.6 07/17/2019   MCV 91 07/17/2019   PLT 308 07/17/2019   Lab Results  Component Value Date   NA 136 03/23/2021   K 4.3 03/23/2021   CO2 26 03/23/2021   GLUCOSE 84 03/23/2021   BUN 8 03/23/2021   CREATININE 0.63 03/23/2021   BILITOT 0.8 03/23/2021   ALKPHOS 66 03/23/2021   AST 15 03/23/2021  ALT 18 03/23/2021   PROT 7.2 03/23/2021   ALBUMIN 4.4 03/23/2021   CALCIUM 9.6 03/23/2021   ANIONGAP 9 06/19/2017   GFR 102.74 03/23/2021   Lab Results  Component Value Date   CHOL 158 07/30/2020   Lab Results  Component Value Date   HDL 50.60 07/30/2020   Lab Results  Component Value Date   LDLCALC 86 07/30/2020   Lab Results  Component Value Date   TRIG 108.0 07/30/2020   Lab Results  Component Value Date   CHOLHDL 3 07/30/2020   Lab Results  Component Value Date   HGBA1C 5.2 03/23/2021      Assessment & Plan:   Problem  List Items Addressed This Visit       Cardiovascular and Mediastinum   Hypertension    Pt advised of the following: Double hydrochlorothiazide to 25 mg once daily also sending medication refill for this.  Also continue lisinopril as prescribed.  Advised patient to buy her own blood pressure machine for at home for at home blood pressure monitoring and bring the log with her to her next visit. Goal is <130/90 on average. Ensure that you have rested for 30 minutes prior to checking your blood pressure. Record your readings and bring them to your next visit if necessary.work on a low sodium diet.  I did advise her that I do not necessarily want her to continue with phentermine however she is getting this from a weight loss clinic and states that she does not want to stop this right now.  I did advise her that it may be increasing her blood pressure and making her feel more anxiety.         Respiratory   Mild persistent asthma, uncomplicated    Patient to continue with Breo daily as this is helping her with her asthma and it appears to be well controlled.      Allergic rhinitis    Continue Zyrtec as needed        Musculoskeletal and Integument   Primary osteoarthritis of both hands    Meloxicam as needed however advised patient that this can also cause sodium retention and increase her lower leg extremity.        Other   Morbid obesity ([place])    Patient chooses to continue with weight loss management center however I do feel that some of her medications may be interfering with some of her symptoms of increased anxiety as well as blood pressure fluctuations especially at work.  Patient to follow-up with weight loss management center and discussed with them      Adjustment disorder with mixed anxiety and depressed mood    We will start Lexapro 10 mg once daily patient to start with half tablet once a day and then after 1 week if tolerating well to increase to 1 tablet.  If any feelings of  suicidal ideation or homicidal ideation please stop the medication and let me know immediately, I also recommended patient reach out to get started with therapy however she states she has done this prior and it was not really helpful for her.  I have also printed out an anxiety reducing handout.      Urinary frequency - Primary    We will rule out urinary tract infection with POCT dip in office today as well as urine culture.  Pending results.  I do feel this is most likely to increase oral hydration with water as well as hydrochlorothiazide as this can  increase the frequency.      Relevant Orders   Urine Culture   POCT Urinalysis Dipstick (Automated)    No orders of the defined types were placed in this encounter.   Follow-up: Return in about 3 weeks (around 10/20/2021) for schedule CPE exam/annual physical .    [name], FNP

## 2021-09-29 NOTE — Assessment & Plan Note (Signed)
We will start Lexapro 10 mg once daily patient to start with half tablet once a day and then after 1 week if tolerating well to increase to 1 tablet.  If any feelings of suicidal ideation or homicidal ideation please stop the medication and let me know immediately, I also recommended patient reach out to get started with therapy however she states she has done this prior and it was not really helpful for her.  I have also printed out an anxiety reducing handout.

## 2021-09-30 LAB — URINE CULTURE
MICRO NUMBER:: 13012716
SPECIMEN QUALITY:: ADEQUATE

## 2021-09-30 MED ORDER — ESCITALOPRAM OXALATE 10 MG PO TABS: 10.0000 mg | ORAL_TABLET | Freq: Every day | ORAL | 1 refills | Status: DC

## 2021-09-30 MED ORDER — HYDROCHLOROTHIAZIDE 25 MG PO TABS: 25.0000 mg | ORAL_TABLET | Freq: Every day | ORAL | 0 refills | Status: DC

## 2021-10-26 ENCOUNTER — Other Ambulatory Visit: Payer: Self-pay | Admitting: Family

## 2021-10-28 NOTE — Telephone Encounter (Signed)
Called pt 3x and LVM--to call the office back to schedule appt. ?

## 2021-11-22 DIAGNOSIS — Z1211 Encounter for screening for malignant neoplasm of colon: Secondary | ICD-10-CM | POA: Diagnosis not present

## 2021-11-22 DIAGNOSIS — Z1330 Encounter for screening examination for mental health and behavioral disorders, unspecified: Secondary | ICD-10-CM | POA: Diagnosis not present

## 2021-11-22 DIAGNOSIS — R635 Abnormal weight gain: Secondary | ICD-10-CM | POA: Diagnosis not present

## 2021-11-22 DIAGNOSIS — Z1331 Encounter for screening for depression: Secondary | ICD-10-CM | POA: Diagnosis not present

## 2021-12-20 ENCOUNTER — Ambulatory Visit (INDEPENDENT_AMBULATORY_CARE_PROVIDER_SITE_OTHER): Payer: BC Managed Care – PPO | Admitting: Family

## 2021-12-20 ENCOUNTER — Encounter: Payer: Self-pay | Admitting: Family

## 2021-12-20 VITALS — BP 126/70 | HR 90 | Temp 98.9°F | Resp 16 | Ht 66.0 in | Wt 231.4 lb

## 2021-12-20 DIAGNOSIS — I10 Essential (primary) hypertension: Secondary | ICD-10-CM | POA: Diagnosis not present

## 2021-12-20 DIAGNOSIS — E7800 Pure hypercholesterolemia, unspecified: Secondary | ICD-10-CM

## 2021-12-20 DIAGNOSIS — E559 Vitamin D deficiency, unspecified: Secondary | ICD-10-CM

## 2021-12-20 DIAGNOSIS — F4323 Adjustment disorder with mixed anxiety and depressed mood: Secondary | ICD-10-CM | POA: Diagnosis not present

## 2021-12-20 DIAGNOSIS — M19042 Primary osteoarthritis, left hand: Secondary | ICD-10-CM

## 2021-12-20 DIAGNOSIS — Z0001 Encounter for general adult medical examination with abnormal findings: Secondary | ICD-10-CM | POA: Diagnosis not present

## 2021-12-20 DIAGNOSIS — J4530 Mild persistent asthma, uncomplicated: Secondary | ICD-10-CM

## 2021-12-20 DIAGNOSIS — J309 Allergic rhinitis, unspecified: Secondary | ICD-10-CM

## 2021-12-20 DIAGNOSIS — M19041 Primary osteoarthritis, right hand: Secondary | ICD-10-CM

## 2021-12-20 DIAGNOSIS — R350 Frequency of micturition: Secondary | ICD-10-CM

## 2021-12-20 DIAGNOSIS — Z1211 Encounter for screening for malignant neoplasm of colon: Secondary | ICD-10-CM

## 2021-12-20 LAB — CBC WITH DIFFERENTIAL/PLATELET
Basophils Absolute: 0.1 10*3/uL (ref 0.0–0.1)
Basophils Relative: 1.2 % (ref 0.0–3.0)
Eosinophils Absolute: 0.1 10*3/uL (ref 0.0–0.7)
Eosinophils Relative: 1.3 % (ref 0.0–5.0)
HCT: 42.4 % (ref 36.0–46.0)
Hemoglobin: 14.6 g/dL (ref 12.0–15.0)
Lymphocytes Relative: 25.8 % (ref 12.0–46.0)
Lymphs Abs: 2.3 10*3/uL (ref 0.7–4.0)
MCHC: 34.4 g/dL (ref 30.0–36.0)
MCV: 90.1 fl (ref 78.0–100.0)
Monocytes Absolute: 0.7 10*3/uL (ref 0.1–1.0)
Monocytes Relative: 8 % (ref 3.0–12.0)
Neutro Abs: 5.7 10*3/uL (ref 1.4–7.7)
Neutrophils Relative %: 63.7 % (ref 43.0–77.0)
Platelets: 311 10*3/uL (ref 150.0–400.0)
RBC: 4.7 Mil/uL (ref 3.87–5.11)
RDW: 12.8 % (ref 11.5–15.5)
WBC: 8.9 10*3/uL (ref 4.0–10.5)

## 2021-12-20 LAB — LIPID PANEL
Cholesterol: 164 mg/dL (ref 0–200)
HDL: 46.3 mg/dL (ref >39.00)
LDL Cholesterol: 91 mg/dL (ref 0–99)
NonHDL: 118.15
Total CHOL/HDL Ratio: 4
Triglycerides: 135 mg/dL (ref 0.0–149.0)
VLDL: 27 mg/dL (ref 0.0–40.0)

## 2021-12-20 LAB — COMPREHENSIVE METABOLIC PANEL
ALT: 22 U/L (ref 0–35)
AST: 17 U/L (ref 0–37)
Albumin: 4.5 g/dL (ref 3.5–5.2)
Alkaline Phosphatase: 63 U/L (ref 39–117)
BUN: 11 mg/dL (ref 6–23)
CO2: 27 mEq/L (ref 19–32)
Calcium: 9.7 mg/dL (ref 8.4–10.5)
Chloride: 100 mEq/L (ref 96–112)
Creatinine, Ser: 0.67 mg/dL (ref 0.40–1.20)
GFR: 100.7 mL/min (ref >60.00)
Glucose, Bld: 91 mg/dL (ref 70–99)
Potassium: 4 mEq/L (ref 3.5–5.1)
Sodium: 136 mEq/L (ref 135–145)
Total Bilirubin: 0.6 mg/dL (ref 0.2–1.2)
Total Protein: 7.6 g/dL (ref 6.0–8.3)

## 2021-12-20 MED ORDER — ESCITALOPRAM OXALATE 20 MG PO TABS: 20.0000 mg | ORAL_TABLET | Freq: Every day | ORAL | 0 refills | Status: DC

## 2021-12-20 MED ORDER — LISINOPRIL 40 MG PO TABS: 40.0000 mg | ORAL_TABLET | Freq: Every day | ORAL | 1 refills | Status: DC

## 2021-12-20 NOTE — Patient Instructions (Addendum)
Stop by the lab prior to leaving today. I will notify you of your results once received.  ? ?Increase lexapro to 20 mg once daily as well as increase lisinopril to 40 mg once daily.  ?Start monitoring your blood pressure daily, around the same time of day, for the next 2-3 weeks.  Ensure that you have rested for 30 minutes prior to checking your blood pressure. Record your readings and bring them to your next visit. ? ?Due to recent changes in healthcare laws, you may see results of your imaging and/or laboratory studies on MyChart before I have had a chance to review them.  I understand that in some cases there may be results that are confusing or concerning to you. Please understand that not all results are received at the same time and often I may need to interpret multiple results in order to provide you with the best plan of care or course of treatment. Therefore, I ask that you please give me 2 business days to thoroughly review all your results before contacting my office for clarification. Should we see a critical lab result, you will be contacted sooner.  ? ?It was a pleasure seeing you today! Please do not hesitate to reach out with any questions and or concerns. ? ?Regards,  ? ?Catha Ontko ?FNP-C ? ? ?

## 2021-12-20 NOTE — Progress Notes (Signed)
? ?Established Patient Office Visit ? ?Subjective:  ?Patient ID: Amy Watson, female    DOB: March 27, 1971  Age: 51 y.o. MRN: [id number] ? ?CC:  ?Chief Complaint  ?Patient presents with  ? Annual Exam  ? ? ?HPI ?Amy Watson is here today for follow up.  ?Pt is without acute concerns. ? ? ?HTN: increased HCTZ to 25 mg once daily, advised to continue lisinopril 20 mg as prescribed. ?Has also been able to lose some weight. Average 140/80-90. Lowest in the last one month 131/88 as well as highest 141/85.  ? ?Anxiety and depression: started on lexapro 20 mg once daily. 1/2 tablet lexapro was given her a headache, but was able to ease in  slowly and tolerating well now. Has noticed increased improvement in sleep, less often waking at night and able to fall back asleep.  ? ?Weight: taking topamax 25 mg once daily as well as adipex phentermine 15 mg daily. Q3M follow up with weight management.  ? ?Shingles vaccination: pt declines today but will consider coming in for nurse visit. ?Colonoscopy: cologuard was ordered but may not be covered with insurance ?[facility]ap: 03/07/2017, [facility] ?Mammogram: 12/22, everything was good. ?Lmp: still on and off  ? ? ? ?Past Medical History:  ?Diagnosis Date  ? Allergy   ? Asthma   ? Hypertension   ? Obesity   ? ? ?Past Surgical History:  ?Procedure Laterality Date  ? FOOT SURGERY    ? right, mortons neuroma rescetion  ? FOOT SURGERY    ? right plantar fascia torn  ? NECK SURGERY    ? laser surgery  ? ? ?Family History  ?Problem Relation Age of Onset  ? Breast cancer Mother 85  ? Diabetes Father   ? Hypertension Father   ? Skin cancer Maternal Grandfather   ? ? ?Social History  ? ?Socioeconomic History  ? Marital status: Divorced  ?  Spouse name: Not on file  ? Number of children: 2  ? Years of education: some college  ? Highest education level: Not on file  ?Occupational History  ? Not on file  ?Tobacco Use  ? Smoking status: Never  ? Smokeless tobacco: Never   ?Vaping Use  ? Vaping Use: Never used  ?Substance and Sexual Activity  ? Alcohol use: Yes  ?  Comment: 1 per week  ? Drug use: No  ? Sexual activity: Not Currently  ?Other Topics Concern  ? Not on file  ?Social History Narrative  ? 05/06/20  ? From: [place]  ? Living: oldest daughter Watt Climes)  ? 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   ? Family: 2 children - Watt Climes and Judson Roch  ?   ? Enjoys: read, yardwork  ?   ? Exercise: parttime job - steps at work  ? Diet: working with obesity specialist to try to reduce appetite  ?   ? Safety  ? Seat belts: Yes   ? Guns: Yes  and secure  ? Safe in relationships: Yes   ? ?Social Determinants of Health  ? ?Financial Resource Strain: Not on file  ?Food Insecurity: Not on file  ?Transportation Needs: Not on file  ?Physical Activity: Not on file  ?Stress: Not on file  ?Social Connections: Not on file  ?Intimate Partner Violence: Not on file  ? ? ?Outpatient Medications Prior to Visit  ?Medication Sig Dispense Refill  ? Calcium Carbonate+Vitamin D (CALCIUM 600+D3) 600-200 MG-UNIT TABS Take 1 tablet by mouth in the  morning and at bedtime. 180 tablet 3  ? cetirizine (ZYRTEC) 10 MG tablet Take 10 mg by mouth as needed.     ? Cholecalciferol (VITAMIN D3) 1.25 MG (50000 UT) CAPS TAKE 1 TABLET BY MOUTH ONCE A WEEK. 4 capsule 0  ? fluticasone furoate-vilanterol (BREO ELLIPTA) 200-25 MCG/INH AEPB Inhale 1 puff into the lungs daily.    ? hydrochlorothiazide (HYDRODIURIL) 25 MG tablet Take 1 tablet (25 mg total) by mouth daily. 180 tablet 0  ? meloxicam (MOBIC) 7.5 MG tablet Take 7.5 mg by mouth as needed.    ? montelukast (SINGULAIR) 10 MG tablet Take by mouth as needed.    ? naproxen sodium (ALEVE) 220 MG tablet Take by mouth.    ? phentermine 15 MG capsule Take 15 mg by mouth every morning.    ? PROAIR HFA 108 (90 Base) MCG/ACT inhaler TAKE 1 2 PUFFS AS NEEDED EVERY 4 6 HOURS AS NEEDED FOR COUGH/WHEEZE    ? topiramate (TOPAMAX) 25 MG tablet PLEASE SEE ATTACHED FOR DETAILED DIRECTIONS    ?  lisinopril (PRINIVIL,ZESTRIL) 20 MG tablet Take 20 mg by mouth daily.    ? escitalopram (LEXAPRO) 10 MG tablet Take 1 tablet (10 mg total) by mouth daily. 30 tablet 1  ? ?No facility-administered medications prior to visit.  ? ? ?Allergies  ?Allergen Reactions  ? Prozac [Fluoxetine] Other (See Comments)  ?  fatigue  ? Azithromycin Other (See Comments)  ?  C. Diff./  Pain   ? ? ?ROS ?Review of Systems  ?Constitutional:  Negative for chills, fatigue, fever and unexpected weight change.  ?Respiratory:  Negative for cough and shortness of breath.   ?Cardiovascular:  Negative for chest pain and leg swelling.  ?Gastrointestinal:  Negative for diarrhea and nausea.  ?Genitourinary:  Negative for difficulty urinating, menstrual problem and vaginal bleeding.  ?Neurological:  Negative for dizziness and headaches.  ?Psychiatric/Behavioral:  Negative for agitation, self-injury, sleep disturbance (improving able to fall back asleep) and suicidal ideas. Nervous/anxious: has improved with lexapro sleeping a little better.   ?All other systems reviewed and are negative. ? ? ?  ?Objective:  ?  ?Physical Exam ? ?Gen: NAD, resting comfortably ?HEENT: TMs normal bilaterally. OP clear. No thyromegaly noted.  ?CV: RRR with no murmurs appreciated ?Pulm: NWOB, CTAB with no crackles, wheezes, or rhonchi ?GI: Normal bowel sounds present. Soft, Nontender, Nondistended. ?MSK: no edema, cyanosis, or clubbing noted ?Skin: warm, dry ?Psych: Normal affect and thought content ? ?BP 126/70   Pulse 90   Temp 98.9 ?F (37.2 ?C)   Resp 16   Ht '5\' 6"'$  (1.676 m)   Wt 231 lb 7 oz (105 kg)   SpO2 97%   BMI 37.35 kg/m?  ?Wt Readings from Last 3 Encounters:  ?12/20/21 231 lb 7 oz (105 kg)  ?09/29/21 237 lb (107.5 kg)  ?06/24/21 236 lb (107 kg)  ? ? ? ?Health Maintenance Due  ?Topic Date Due  ? COLONOSCOPY (Pts 45-18yr Insurance coverage will need to be confirmed)  Never done  ? Zoster Vaccines- Shingrix (1 of 2) Never done  ? ? ?There are no preventive  care reminders to display for this patient. ? ?Lab Results  ?Component Value Date  ? TSH 1.93 03/23/2021  ? ?Lab Results  ?Component Value Date  ? WBC 8.9 12/20/2021  ? HGB 14.6 12/20/2021  ? HCT 42.4 12/20/2021  ? MCV 90.1 12/20/2021  ? PLT 311.0 12/20/2021  ? ?Lab Results  ?Component Value Date  ? NA  136 12/20/2021  ? K 4.0 12/20/2021  ? CO2 27 12/20/2021  ? GLUCOSE 91 12/20/2021  ? BUN 11 12/20/2021  ? CREATININE 0.67 12/20/2021  ? BILITOT 0.6 12/20/2021  ? ALKPHOS 63 12/20/2021  ? AST 17 12/20/2021  ? ALT 22 12/20/2021  ? PROT 7.6 12/20/2021  ? ALBUMIN 4.5 12/20/2021  ? CALCIUM 9.7 12/20/2021  ? ANIONGAP 9 06/19/2017  ? GFR 100.70 12/20/2021  ? ?Lab Results  ?Component Value Date  ? CHOL 164 12/20/2021  ? ?Lab Results  ?Component Value Date  ? HDL 46.30 12/20/2021  ? ?Lab Results  ?Component Value Date  ? [place] 91 12/20/2021  ? ?Lab Results  ?Component Value Date  ? TRIG 135.0 12/20/2021  ? ?Lab Results  ?Component Value Date  ? CHOLHDL 4 12/20/2021  ? ?Lab Results  ?Component Value Date  ? HGBA1C 5.2 03/23/2021  ? ? ?  ?Assessment & Plan:  ? ?Problem List Items Addressed This Visit   ? ?  ? Cardiovascular and Mediastinum  ? Hypertension  ?  Continue hctz 25 mg once daily as well as increase lisinopril to 40 mg once daily  ?reviewed blood pressure log ?Pt advised of the following:  ?Continue medication as prescribed. Monitor blood pressure periodically and/or when you feel symptomatic. Goal is <130/90 on average. Ensure that you have rested for 30 minutes prior to checking your blood pressure. Record your readings and bring them to your next visit if necessary.work on a low sodium diet. ? ?  ?  ? Relevant Medications  ? lisinopril (ZESTRIL) 40 MG tablet  ?  ? Respiratory  ? Mild persistent asthma, uncomplicated  ?  Controlled ?Continue breo as well as singulair 10 mg  ? ?  ?  ? Allergic rhinitis  ?  Continue singulair 10 mg ? ?  ?  ?  ? Musculoskeletal and Integument  ? Primary osteoarthritis of both hands  ?   stable ? ?  ?  ?  ? Other  ? Morbid obesity ([place])  ?  Work on diet and exercise as tolerated ? ?  ?  ? Vitamin D deficiency  ?  Ordered vitamin d pending results ? ?  ?  ? Relevant Orders  ? VITAMIN D 25 Hydroxy (Vit

## 2021-12-21 LAB — VITAMIN D 25 HYDROXY (VIT D DEFICIENCY, FRACTURES): VITD: 33.05 ng/mL (ref 30.00–100.00)

## 2021-12-22 NOTE — Assessment & Plan Note (Signed)
Ordering cologuard ?Pt declines colonoscopy  ?

## 2021-12-22 NOTE — Assessment & Plan Note (Signed)
stable °

## 2021-12-22 NOTE — Assessment & Plan Note (Signed)
Continue lexapro 20 mg once daily  ?Work on anxiety reducing techniques ?

## 2021-12-22 NOTE — Assessment & Plan Note (Signed)
Patient Counseling(The following topics were reviewed): ? Preventative care handout given to pt  ?Health maintenance and immunizations reviewed. Please refer to Health maintenance section. ?Pt advised on safe sex, wearing seatbelts in car, and proper nutrition ?labwork ordered today for annual ?Dental health: Discussed importance of regular tooth brushing, flossing, and dental visits. ? ? ?

## 2021-12-22 NOTE — Assessment & Plan Note (Addendum)
Controlled ?Continue breo as well as singulair 10 mg  ?

## 2021-12-22 NOTE — Assessment & Plan Note (Addendum)
Continue hctz 25 mg once daily as well as increase lisinopril to 40 mg once daily  ?reviewed blood pressure log ?Pt advised of the following:  ?Continue medication as prescribed. Monitor blood pressure periodically and/or when you feel symptomatic. Goal is <130/90 on average. Ensure that you have rested for 30 minutes prior to checking your blood pressure. Record your readings and bring them to your next visit if necessary.work on a low sodium diet. ? ?

## 2021-12-22 NOTE — Assessment & Plan Note (Signed)
Ordered vitamin d pending results.   

## 2021-12-22 NOTE — Assessment & Plan Note (Signed)
Ordered lipid panel, pending results. Work on low cholesterol diet and exercise as tolerated ? ?

## 2021-12-22 NOTE — Assessment & Plan Note (Signed)
Continue singulair 10 mg ?

## 2021-12-22 NOTE — Assessment & Plan Note (Signed)
Work on diet and exercise as tolerated  ?

## 2021-12-23 DIAGNOSIS — J301 Allergic rhinitis due to pollen: Secondary | ICD-10-CM | POA: Diagnosis not present

## 2021-12-23 DIAGNOSIS — J4530 Mild persistent asthma, uncomplicated: Secondary | ICD-10-CM | POA: Diagnosis not present

## 2021-12-23 DIAGNOSIS — J3089 Other allergic rhinitis: Secondary | ICD-10-CM | POA: Diagnosis not present

## 2021-12-24 ENCOUNTER — Encounter: Payer: Self-pay | Admitting: Family

## 2022-01-18 ENCOUNTER — Ambulatory Visit: Payer: BC Managed Care – PPO | Admitting: Family

## 2022-01-18 ENCOUNTER — Ambulatory Visit (INDEPENDENT_AMBULATORY_CARE_PROVIDER_SITE_OTHER)
Admission: RE | Admit: 2022-01-18 | Discharge: 2022-01-18 | Disposition: A | Discharge status: Home / Self Care | Payer: BC Managed Care – PPO | Source: Ambulatory Visit | Attending: Family | Admitting: Family

## 2022-01-18 ENCOUNTER — Encounter: Payer: Self-pay | Admitting: Family

## 2022-01-18 VITALS — BP 122/86 | HR 86 | Temp 99.1°F | Resp 16 | Ht 66.0 in | Wt 238.0 lb

## 2022-01-18 DIAGNOSIS — R109 Unspecified abdominal pain: Secondary | ICD-10-CM | POA: Insufficient documentation

## 2022-01-18 DIAGNOSIS — M545 Low back pain, unspecified: Secondary | ICD-10-CM | POA: Diagnosis not present

## 2022-01-18 DIAGNOSIS — M549 Dorsalgia, unspecified: Secondary | ICD-10-CM

## 2022-01-18 LAB — POCT URINALYSIS DIP (CLINITEK)
Bilirubin, UA: NEGATIVE
Blood, UA: NEGATIVE
Glucose, UA: NEGATIVE mg/dL
Ketones, POC UA: NEGATIVE mg/dL
Leukocytes, UA: NEGATIVE
Nitrite, UA: NEGATIVE
POC PROTEIN,UA: NEGATIVE
Spec Grav, UA: 1.02 (ref 1.010–1.025)
Urobilinogen, UA: 0.2 E.U./dL
pH, UA: 6 (ref 5.0–8.0)

## 2022-01-18 MED ORDER — CYCLOBENZAPRINE HCL 10 MG PO TABS: 10.0000 mg | ORAL_TABLET | Freq: Three times a day (TID) | ORAL | 0 refills | Status: DC | PRN

## 2022-01-18 MED ORDER — METHYLPREDNISOLONE 4 MG PO TBPK: ORAL_TABLET | ORAL | 0 refills | Status: DC

## 2022-01-18 NOTE — Assessment & Plan Note (Signed)
Very mild, not likely kidney infection or kidney stone , likely muscle spasm at flank area however will order poct urin as well as urine culture, pending results.

## 2022-01-18 NOTE — Progress Notes (Signed)
Established Patient Office Visit  Subjective:  Patient ID: Amy Watson, female    DOB: [birth year]  Age: 51 y.o. MRN: [id number]  CC:  Chief Complaint  Patient presents with   Back Pain    Central up the spine on the right X 1 month.Has tired medication to help with the pain.     HPI Amy Watson is here today with concerns.   Over the last three weeks or so she started with lower back pain on the right lower side, mid to right side, and when she stands or sits up straight the pain intensities. When she went to dinner four days ago, she ate at dinner with hard up right chairs and she couldn't sit up straight at all as she had a sharp pain shooting up her back. Applies ice with mild relief. Two aleve twice daily with only very mild relief of symptoms.   She does have h/o sciatica but per her doesn't feel similar.  No radiation of pain down the leg or down the thigh.   Past Medical History:  Diagnosis Date   Allergy    Asthma    Hypertension    Obesity     Past Surgical History:  Procedure Laterality Date   FOOT SURGERY     right, mortons neuroma rescetion   FOOT SURGERY     right plantar fascia torn   NECK SURGERY     laser surgery    Family History  Problem Relation Age of Onset   Breast cancer Mother 66   Diabetes Father    Hypertension Father    Skin cancer Maternal Grandfather     Social History   Socioeconomic History   Marital status: Divorced    Spouse name: Not on file   Number of children: 2   Years of education: some college   Highest education level: Not on file  Occupational History   Not on file  Tobacco Use   Smoking status: Never   Smokeless tobacco: Never  Vaping Use   Vaping Use: Never used  Substance and Sexual Activity   Alcohol use: Yes    Comment: 1 per week   Drug use: No   Sexual activity: Not Currently  Other Topics Concern   Not on file  Social History Narrative   05/06/20   From: [place]   Living: oldest  daughter Watt Climes)   Work: Psychologist, 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[employer]      Family: 2 children - Engineer, water and Judson Roch      Enjoys: read, yardwork      Exercise: parttime job - [employer] at work   Diet: working with obesity specialist to try to reduce appetite      Safety   Seat belts: Yes    Guns: Yes  and secure   Safe in relationships: Yes    Social Determinants of Radio broadcast assistant Strain: Not on file  Food Insecurity: Not on file  Transportation Needs: Not on file  Physical Activity: Not on file  Stress: Not on file  Social Connections: Not on file  Intimate Partner Violence: Not on file    Outpatient Medications Prior to Visit  Medication Sig Dispense Refill   Calcium Carbonate+Vitamin D (CALCIUM 600+D3) 600-200 MG-UNIT TABS Take 1 tablet by mouth in the morning and at bedtime. 180 tablet 3   cetirizine (ZYRTEC) 10 MG tablet Take 10 mg by mouth as needed.      Cholecalciferol (VITAMIN D3) 1.25 MG (50000  UT) CAPS TAKE 1 TABLET BY MOUTH ONCE A WEEK. 4 capsule 0   escitalopram (LEXAPRO) 20 MG tablet Take 1 tablet (20 mg total) by mouth daily. 90 tablet 0   fluticasone furoate-vilanterol (BREO ELLIPTA) 200-25 MCG/INH AEPB Inhale 1 puff into the lungs daily.     hydrochlorothiazide (HYDRODIURIL) 25 MG tablet Take 1 tablet (25 mg total) by mouth daily. 180 tablet 0   lisinopril (ZESTRIL) 40 MG tablet Take 1 tablet (40 mg total) by mouth daily. 90 tablet 1   meloxicam (MOBIC) 7.5 MG tablet Take 7.5 mg by mouth as needed.     montelukast (SINGULAIR) 10 MG tablet Take by mouth as needed.     naproxen sodium (ALEVE) 220 MG tablet Take by mouth.     phentermine 15 MG capsule Take 15 mg by mouth every morning.     PROAIR HFA 108 (90 Base) MCG/ACT inhaler TAKE 1 2 PUFFS AS NEEDED EVERY 4 6 HOURS AS NEEDED FOR COUGH/WHEEZE     topiramate (TOPAMAX) 25 MG tablet PLEASE SEE ATTACHED FOR DETAILED DIRECTIONS     No facility-administered medications prior to visit.    Allergies  Allergen Reactions    Prozac [Fluoxetine] Other (See Comments)    fatigue   Azithromycin Other (See Comments)    C. Diff./  Pain         Objective:    Physical Exam Constitutional:      General: She is not in acute distress.    Appearance: Normal appearance. She is obese. She is not ill-appearing, toxic-appearing or diaphoretic.  Pulmonary:     Effort: Pulmonary effort is normal.  Abdominal:     Tenderness: There is right CVA tenderness (mild).  Musculoskeletal:     Thoracic back: Spasms and tenderness (right midline) present. Decreased range of motion.     Lumbar back: Decreased range of motion (pain with hyperext and flexion).  Neurological:     General: No focal deficit present.     Mental Status: She is alert and oriented to person, place, and time.  Psychiatric:        Mood and Affect: Mood normal.        Behavior: Behavior normal.        Thought Content: Thought content normal.        Judgment: Judgment normal.    BP 122/86   Pulse 86   Temp 99.1 F (37.3 C)   Resp 16   Ht '5\' 6"'$  (1.676 m)   Wt 238 lb (108 kg)   SpO2 97%   BMI 38.41 kg/m  Wt Readings from Last 3 Encounters:  01/18/22 238 lb (108 kg)  12/20/21 231 lb 7 oz (105 kg)  09/29/21 237 lb (107.5 kg)     Health Maintenance Due  Topic Date Due   COLONOSCOPY (Pts 45-62yr Insurance coverage will need to be confirmed)  Never done   Zoster Vaccines- Shingrix (1 of 2) Never done   COVID-19 Vaccine (5 - Booster) 09/01/2020   PAP SMEAR-Modifier  03/07/2022    There are no preventive care reminders to display for this patient.  Lab Results  Component Value Date   TSH 1.93 03/23/2021   Lab Results  Component Value Date   WBC 8.9 12/20/2021   HGB 14.6 12/20/2021   HCT 42.4 12/20/2021   MCV 90.1 12/20/2021   PLT 311.0 12/20/2021   Lab Results  Component Value Date   NA 136 12/20/2021   K 4.0 12/20/2021   CO2 27  12/20/2021   GLUCOSE 91 12/20/2021   BUN 11 12/20/2021   CREATININE 0.67 12/20/2021   BILITOT 0.6  12/20/2021   ALKPHOS 63 12/20/2021   AST 17 12/20/2021   ALT 22 12/20/2021   PROT 7.6 12/20/2021   ALBUMIN 4.5 12/20/2021   CALCIUM 9.7 12/20/2021   ANIONGAP 9 06/19/2017   GFR 100.70 12/20/2021   Lab Results  Component Value Date   HGBA1C 5.2 03/23/2021      Assessment & Plan:   Problem List Items Addressed This Visit       Other   Right flank pain    Very mild, not likely kidney infection or kidney stone , likely muscle spasm at flank area however will order poct urin as well as urine culture, pending results.        Relevant Orders   Urine Culture   POCT URINALYSIS DIP (CLINITEK)   Mid-back pain, acute - Primary    Xray today thoracic lumbar spine pending results rx medrol dose pack  rx flexeril 10 mg 1/2 to one tablet prn pain Ice/heat prn        Relevant Medications   cyclobenzaprine (FLEXERIL) 10 MG tablet   methylPREDNISolone (MEDROL DOSEPAK) 4 MG TBPK tablet   Other Relevant Orders   DG Thoracic Spine W/Swimmers   DG Lumbar Spine Complete   RESOLVED: Acute midline low back pain without sciatica   Relevant Medications   cyclobenzaprine (FLEXERIL) 10 MG tablet   methylPREDNISolone (MEDROL DOSEPAK) 4 MG TBPK tablet   Other Relevant Orders   DG Thoracic Spine W/Swimmers   DG Lumbar Spine Complete   Urine Culture   POCT URINALYSIS DIP (CLINITEK)    Meds ordered this encounter  Medications   cyclobenzaprine (FLEXERIL) 10 MG tablet    Sig: Take 1 tablet (10 mg total) by mouth 3 (three) times daily as needed for muscle spasms.    Dispense:  30 tablet    Refill:  0    Order Specific Question:   Supervising Provider    Answer:   BEDSOLE, AMY E [[id number]]   methylPREDNISolone (MEDROL DOSEPAK) 4 MG TBPK tablet    Sig: Take per package instructions    Dispense:  21 tablet    Refill:  0    Order Specific Question:   Supervising Provider    Answer:   BEDSOLE, AMY E [[id number]]    Follow-up: No follow-ups on file.    [name], FNP

## 2022-01-18 NOTE — Assessment & Plan Note (Signed)
Xray today thoracic lumbar spine pending results rx medrol dose pack  rx flexeril 10 mg 1/2 to one tablet prn pain Ice/heat prn

## 2022-01-18 NOTE — Patient Instructions (Signed)
Complete xray(s) prior to leaving today. I will notify you of your results once received.  Due to recent changes in healthcare laws, you may see results of your imaging and/or laboratory studies on MyChart before I have had a chance to review them.  I understand that in some cases there may be results that are confusing or concerning to you. Please understand that not all results are received at the same time and often I may need to interpret multiple results in order to provide you with the best plan of care or course of treatment. Therefore, I ask that you please give me 2 business days to thoroughly review all your results before contacting my office for clarification. Should we see a critical lab result, you will be contacted sooner.   It was a pleasure seeing you today! Please do not hesitate to reach out with any questions and or concerns.  Regards,   Macauley Mossberg FNP-C  

## 2022-01-19 LAB — URINE CULTURE
MICRO NUMBER:: 13489402
SPECIMEN QUALITY:: ADEQUATE

## 2022-01-24 ENCOUNTER — Ambulatory Visit: Payer: BC Managed Care – PPO | Admitting: Family

## 2022-01-28 ENCOUNTER — Encounter: Payer: Self-pay | Admitting: Family

## 2022-01-28 NOTE — Telephone Encounter (Signed)
Can you get  this ready for patient?

## 2022-02-01 ENCOUNTER — Encounter: Payer: Self-pay | Admitting: Family

## 2022-02-07 ENCOUNTER — Ambulatory Visit (INDEPENDENT_AMBULATORY_CARE_PROVIDER_SITE_OTHER)
Admission: RE | Admit: 2022-02-07 | Discharge: 2022-02-07 | Disposition: A | Discharge status: Home / Self Care | Payer: BC Managed Care – PPO | Source: Ambulatory Visit | Attending: Family Medicine | Admitting: Family Medicine

## 2022-02-07 ENCOUNTER — Encounter: Payer: Self-pay | Admitting: Family Medicine

## 2022-02-07 ENCOUNTER — Ambulatory Visit: Payer: BC Managed Care – PPO | Admitting: Family Medicine

## 2022-02-07 VITALS — BP 122/74 | HR 93 | Temp 98.5°F | Ht 66.0 in | Wt 245.4 lb

## 2022-02-07 DIAGNOSIS — M79671 Pain in right foot: Secondary | ICD-10-CM

## 2022-02-07 DIAGNOSIS — M25571 Pain in right ankle and joints of right foot: Secondary | ICD-10-CM

## 2022-02-07 DIAGNOSIS — M7671 Peroneal tendinitis, right leg: Secondary | ICD-10-CM | POA: Diagnosis not present

## 2022-02-07 DIAGNOSIS — M7731 Calcaneal spur, right foot: Secondary | ICD-10-CM | POA: Diagnosis not present

## 2022-02-07 MED ORDER — PREDNISONE 20 MG PO TABS: ORAL_TABLET | ORAL | 0 refills | Status: DC

## 2022-02-08 ENCOUNTER — Encounter: Payer: Self-pay | Admitting: Family Medicine

## 2022-02-08 NOTE — Telephone Encounter (Signed)
Amy,   I do not know how much clearer I could be on my check-out instructions.  Can you have our office follow-up verbally with this patient, change the appointment / foot follow-up with me instead of her primary care doctor?  Please speak to the person who made this appointment for correction and teaching.  "Return in about 3 weeks (around 02/28/2022) for 3 1/2 weeks with Dr. Patsy Lager, R foot follow-up."  - this was my check-out note.

## 2022-02-10 DIAGNOSIS — M5451 Vertebrogenic low back pain: Secondary | ICD-10-CM | POA: Diagnosis not present

## 2022-02-28 ENCOUNTER — Ambulatory Visit: Payer: BC Managed Care – PPO | Admitting: Family

## 2022-02-28 ENCOUNTER — Ambulatory Visit: Payer: BC Managed Care – PPO | Admitting: Family Medicine

## 2022-03-07 ENCOUNTER — Encounter: Payer: Self-pay | Admitting: Family Medicine

## 2022-03-07 ENCOUNTER — Ambulatory Visit: Payer: BC Managed Care – PPO | Admitting: Family Medicine

## 2022-03-07 VITALS — BP 122/80 | HR 105 | Temp 98.7°F | Ht 66.0 in | Wt 244.0 lb

## 2022-03-07 DIAGNOSIS — M25571 Pain in right ankle and joints of right foot: Secondary | ICD-10-CM

## 2022-03-07 DIAGNOSIS — M7671 Peroneal tendinitis, right leg: Secondary | ICD-10-CM | POA: Diagnosis not present

## 2022-03-07 DIAGNOSIS — M79671 Pain in right foot: Secondary | ICD-10-CM

## 2022-03-07 NOTE — Progress Notes (Unsigned)
    Amy Rosal T. Ronan Dion, MD, [facility] [place], [address]  Phone: [phone]  FAX: [place] - 51 y.o. female  MRN [id number]  Date of Birth: [birth year]  Date: 03/07/2022  PCP: [name], FNP  Referral: [name], FNP  Chief Complaint  Patient presents with   Follow-up    Right Foot & Back   Subjective:   Amy Watson is a 52 y.o. very pleasant female patient with Body mass index is 39.38 kg/m. who presents with the following:  Patient is here for follow-up right-sided ankle pain.  When I saw her last time, it sounded as if she had a prior lateral ankle sprain with some subsequent peroneal tendinopathy and some midfoot pain.  I placed her in a short cam walker boot and gave her a burst of some steroids.  She is here to follow-up about this today, she is also having some ongoing back pain.    02/07/2022 Last OV with Owens Loffler, MD  2 months ago, the patient was walking and sustained an injury where she had some acute severe pain on the lateral aspect of her ankle.  She is still able to walk, she is having some pain and limping slightly.  Right now pain is predominantly on the dorsum of the foot in the region of the midfoot.  There is no bruising, no significant swelling.  She does still have some lateral ankle pain, this is mostly improved compared to previously.   Review of Systems is noted in the HPI, as appropriate  Objective:   BP 122/80   Pulse (!) 105   Temp 98.7 F (37.1 C) (Oral)   Ht '5\' 6"'$  (1.676 m)   Wt 244 lb (110.7 kg) Comment: patient reported  SpO2 94%   BMI 39.38 kg/m   GEN: No acute distress; alert,appropriate. PULM: Breathing comfortably in no respiratory distress PSYCH: Normally interactive.   Laboratory and Imaging Data:  Assessment and Plan:   ***

## 2022-03-08 DIAGNOSIS — M5459 Other low back pain: Secondary | ICD-10-CM | POA: Diagnosis not present

## 2022-03-09 ENCOUNTER — Encounter: Payer: Self-pay | Admitting: Family Medicine

## 2022-03-14 DIAGNOSIS — M5459 Other low back pain: Secondary | ICD-10-CM | POA: Diagnosis not present

## 2022-03-19 ENCOUNTER — Encounter: Payer: Self-pay | Admitting: Family Medicine

## 2022-03-20 ENCOUNTER — Other Ambulatory Visit: Payer: Self-pay | Admitting: Family

## 2022-03-20 DIAGNOSIS — F4323 Adjustment disorder with mixed anxiety and depressed mood: Secondary | ICD-10-CM

## 2022-03-21 ENCOUNTER — Encounter: Payer: Self-pay | Admitting: Family

## 2022-03-21 DIAGNOSIS — M5459 Other low back pain: Secondary | ICD-10-CM | POA: Diagnosis not present

## 2022-03-23 DIAGNOSIS — M25571 Pain in right ankle and joints of right foot: Secondary | ICD-10-CM | POA: Diagnosis not present

## 2022-03-23 DIAGNOSIS — M79671 Pain in right foot: Secondary | ICD-10-CM | POA: Diagnosis not present

## 2022-03-24 DIAGNOSIS — M5459 Other low back pain: Secondary | ICD-10-CM | POA: Diagnosis not present

## 2022-03-31 DIAGNOSIS — M5459 Other low back pain: Secondary | ICD-10-CM | POA: Diagnosis not present

## 2022-04-03 ENCOUNTER — Other Ambulatory Visit: Payer: Self-pay | Admitting: Family

## 2022-04-07 DIAGNOSIS — M5459 Other low back pain: Secondary | ICD-10-CM | POA: Diagnosis not present

## 2022-04-11 DIAGNOSIS — M5459 Other low back pain: Secondary | ICD-10-CM | POA: Diagnosis not present

## 2022-04-20 ENCOUNTER — Encounter: Payer: Self-pay | Admitting: Family

## 2022-04-20 DIAGNOSIS — Z30011 Encounter for initial prescription of contraceptive pills: Secondary | ICD-10-CM

## 2022-04-20 DIAGNOSIS — N898 Other specified noninflammatory disorders of vagina: Secondary | ICD-10-CM

## 2022-04-21 DIAGNOSIS — M5459 Other low back pain: Secondary | ICD-10-CM | POA: Diagnosis not present

## 2022-04-25 DIAGNOSIS — M5459 Other low back pain: Secondary | ICD-10-CM | POA: Diagnosis not present

## 2022-05-05 DIAGNOSIS — M25571 Pain in right ankle and joints of right foot: Secondary | ICD-10-CM | POA: Diagnosis not present

## 2022-05-05 DIAGNOSIS — M5459 Other low back pain: Secondary | ICD-10-CM | POA: Diagnosis not present

## 2022-05-12 ENCOUNTER — Other Ambulatory Visit (HOSPITAL_COMMUNITY)
Admission: RE | Admit: 2022-05-12 | Discharge: 2022-05-12 | Disposition: A | Discharge status: Home / Self Care | Payer: BC Managed Care – PPO | Source: Ambulatory Visit | Attending: Family | Admitting: Family

## 2022-05-12 ENCOUNTER — Encounter: Payer: Self-pay | Admitting: Family

## 2022-05-12 ENCOUNTER — Ambulatory Visit (INDEPENDENT_AMBULATORY_CARE_PROVIDER_SITE_OTHER): Payer: BC Managed Care – PPO | Admitting: Family

## 2022-05-12 VITALS — BP 118/72 | HR 81 | Temp 98.6°F | Resp 16 | Ht 66.0 in | Wt 244.2 lb

## 2022-05-12 DIAGNOSIS — C539 Malignant neoplasm of cervix uteri, unspecified: Secondary | ICD-10-CM | POA: Insufficient documentation

## 2022-05-12 DIAGNOSIS — Z124 Encounter for screening for malignant neoplasm of cervix: Secondary | ICD-10-CM | POA: Diagnosis not present

## 2022-05-12 DIAGNOSIS — Z23 Encounter for immunization: Secondary | ICD-10-CM

## 2022-05-12 DIAGNOSIS — Z30011 Encounter for initial prescription of contraceptive pills: Secondary | ICD-10-CM | POA: Insufficient documentation

## 2022-05-12 DIAGNOSIS — Z1272 Encounter for screening for malignant neoplasm of vagina: Secondary | ICD-10-CM | POA: Insufficient documentation

## 2022-05-12 DIAGNOSIS — Z1231 Encounter for screening mammogram for malignant neoplasm of breast: Secondary | ICD-10-CM | POA: Insufficient documentation

## 2022-05-12 DIAGNOSIS — Z01419 Encounter for gynecological examination (general) (routine) without abnormal findings: Secondary | ICD-10-CM | POA: Insufficient documentation

## 2022-05-12 DIAGNOSIS — N898 Other specified noninflammatory disorders of vagina: Secondary | ICD-10-CM | POA: Diagnosis not present

## 2022-05-12 NOTE — Progress Notes (Signed)
Established Patient Office Visit  Subjective:  Patient ID: Amy Watson, female    DOB: 01-27-1971  Age: 51 y.o. MRN: [id number]  CC:  Chief Complaint  Patient presents with   Gynecologic Exam    HPI Amy Watson is here today for an well woman exam.   Pt is without acute concerns.   Mammogram: 07/28/2021 Last pap: 03/07/2017 Colonoscopy: has colo guard at home just has to complete   LMP, about three weeks ago. Has become irregular in the last one year.  She is not currently sexually active, but might become in the future. Might become sexually active in the future. Does report increased vaginal sensitivity   Used to be on birth control in the past, on a low dose pill, has not been on for a few years tolerated well. Pt is not a smoker. No seizure history. Does occasionally get migraines, but frequency has decreased in the last two years. She got tragus piercing right ear with relief.   Past Medical History:  Diagnosis Date   Allergy    Asthma    Hypertension    Obesity     Past Surgical History:  Procedure Laterality Date   FOOT SURGERY     right, mortons neuroma rescetion   FOOT SURGERY     right plantar fascia torn   NECK SURGERY     laser surgery    Family History  Problem Relation Age of Onset   Breast cancer Mother 2   Diabetes Father    Hypertension Father    Skin cancer Maternal Grandfather     Social History   Socioeconomic History   Marital status: Divorced    Spouse name: Not on file   Number of children: 2   Years of education: some college   Highest education level: Not on file  Occupational History   Not on file  Tobacco Use   Smoking status: Never   Smokeless tobacco: Never  Vaping Use   Vaping Use: Never used  Substance and Sexual Activity   Alcohol use: Yes    Comment: 1 per week   Drug use: No   Sexual activity: Not Currently  Other Topics Concern   Not on file  Social History Narrative   05/06/20   From:  [place]   Living: oldest daughter Watt Climes)   Work: Psychologist, 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[employer]      Family: 2 children - Engineer, water and Judson Roch      Enjoys: read, yardwork      Exercise: parttime job - steps at work   Diet: working with obesity specialist to try to reduce appetite      Safety   Seat belts: Yes    Guns: Yes  and secure   Safe in relationships: Yes    Social Determinants of Radio broadcast assistant Strain: Not on file  Food Insecurity: Not on file  Transportation Needs: Not on file  Physical Activity: Not on file  Stress: Not on file  Social Connections: Not on file  Intimate Partner Violence: Not on file    Outpatient Medications Prior to Visit  Medication Sig Dispense Refill   Calcium Carbonate+Vitamin D (CALCIUM 600+D3) 600-200 MG-UNIT TABS Take 1 tablet by mouth in the morning and at bedtime. 180 tablet 3   celecoxib (CELEBREX) 200 MG capsule Take 200 mg by mouth 2 (two) times daily.     cetirizine (ZYRTEC) 10 MG tablet Take 10 mg by mouth as needed.  Cholecalciferol (VITAMIN D3) 1.25 MG (50000 UT) CAPS TAKE 1 TABLET BY MOUTH ONCE A WEEK. 4 capsule 0   cyclobenzaprine (FLEXERIL) 10 MG tablet Take 1 tablet (10 mg total) by mouth 3 (three) times daily as needed for muscle spasms. 30 tablet 0   escitalopram (LEXAPRO) 20 MG tablet TAKE 1 TABLET BY MOUTH EVERY DAY 90 tablet 3   fluticasone furoate-vilanterol (BREO ELLIPTA) 200-25 MCG/INH AEPB Inhale 1 puff into the lungs daily.     hydrochlorothiazide (HYDRODIURIL) 25 MG tablet TAKE 1 TABLET (25 MG TOTAL) BY MOUTH DAILY. 90 tablet 1   lisinopril (ZESTRIL) 40 MG tablet Take 1 tablet (40 mg total) by mouth daily. 90 tablet 1   meloxicam (MOBIC) 7.5 MG tablet Take 7.5 mg by mouth as needed.     montelukast (SINGULAIR) 10 MG tablet Take by mouth as needed.     phentermine 15 MG capsule Take 15 mg by mouth every morning.     PROAIR HFA 108 (90 Base) MCG/ACT inhaler TAKE 1 2 PUFFS AS NEEDED EVERY 4 6 HOURS AS NEEDED FOR  COUGH/WHEEZE     topiramate (TOPAMAX) 25 MG tablet PLEASE SEE ATTACHED FOR DETAILED DIRECTIONS     No facility-administered medications prior to visit.    Allergies  Allergen Reactions   Prozac [Fluoxetine] Other (See Comments)    fatigue   Azithromycin Other (See Comments)    C. Diff./  Pain     ROS Review of Systems  Review of Systems   Breasts: negative for nipple discharge, breast pain, breast mass, nipple changes, breast rash. Genitourinary:  Negative for decreased urine volume, difficulty urinating, dyspareunia, dysuria, flank pain, menstrual problem, pelvic pain, urgency, vaginal bleeding, vaginal discharge and vaginal pain.  Psychiatric/Behavioral:  Negative for depression and suicidal ideas.   All other systems reviewed and are negative.    Objective:    Physical Exam  Gen: NAD, resting comfortably Breasts: breasts appear normal, no suspicious masses, no skin or nipple changes or axillary nodes Physical Exam Genitourinary:    General: Normal vulva.     Pubic Area: No rash.      Labia:        Right: slight erythema labia majora, tenderness, lesion or injury.        Left: slight erythema labia majora, tenderness, lesion or injury.      Urethra: No prolapse or urethral pain.     Vagina: Normal. No vaginal discharge, tenderness or lesions.     Cervix: slight white cervical discharge     Rectum: Normal.  Psych: Normal affect and thought content  BP 118/72   Pulse 81   Temp 98.6 F (37 C)   Resp 16   Ht '5\' 6"'$  (1.676 m)   Wt 244 lb 4 oz (110.8 kg)   SpO2 98%   BMI 39.42 kg/m  Wt Readings from Last 3 Encounters:  05/12/22 244 lb 4 oz (110.8 kg)  03/07/22 244 lb (110.7 kg)  02/07/22 245 lb 7 oz (111.3 kg)     Health Maintenance Due  Topic Date Due   COLONOSCOPY (Pts 45-71yr Insurance coverage will need to be confirmed)  Never done   PAP SMEAR-Modifier  03/07/2022    There are no preventive care reminders to display for this patient.  Lab Results   Component Value Date   TSH 1.93 03/23/2021   Lab Results  Component Value Date   WBC 8.9 12/20/2021   HGB 14.6 12/20/2021   HCT 42.4 12/20/2021   MCV  90.1 12/20/2021   PLT 311.0 12/20/2021   Lab Results  Component Value Date   NA 136 12/20/2021   K 4.0 12/20/2021   CO2 27 12/20/2021   GLUCOSE 91 12/20/2021   BUN 11 12/20/2021   CREATININE 0.67 12/20/2021   BILITOT 0.6 12/20/2021   ALKPHOS 63 12/20/2021   AST 17 12/20/2021   ALT 22 12/20/2021   PROT 7.6 12/20/2021   ALBUMIN 4.5 12/20/2021   CALCIUM 9.7 12/20/2021   ANIONGAP 9 06/19/2017   GFR 100.70 12/20/2021   Lab Results  Component Value Date   CHOL 164 12/20/2021   Lab Results  Component Value Date   HDL 46.30 12/20/2021   Lab Results  Component Value Date   LDLCALC 91 12/20/2021   Lab Results  Component Value Date   TRIG 135.0 12/20/2021   Lab Results  Component Value Date   CHOLHDL 4 12/20/2021   Lab Results  Component Value Date   HGBA1C 5.2 03/23/2021      Assessment & Plan:   Problem List Items Addressed This Visit       Other   Morbid obesity ([place])    Work on diet and exercise as tolerated       Screening for cervical cancer    Pt verbalized consent for pelvic exam Declines std testing hpv thin prep ordered and pending results Pap exam in office completed, pt tolerated well.        Relevant Orders   Cytology - PAP   WET PREP BY MOLECULAR PROBE   Encounter for screening mammogram for malignant neoplasm of breast - Primary    Mammogram ordered. Pending results.       Relevant Orders   MM 3D SCREEN BREAST BILATERAL   Encounter for Papanicolaou smear of vagina as part of routine gynecological examination    Patient Counseling(The following topics were reviewed):  Preventative care handout given to pt  Health maintenance and immunizations reviewed. Please refer to Health maintenance section. Pt advised on safe sex, wearing seatbelts in car, and proper nutrition labwork  ordered today for annual Dental health: Discussed importance of regular tooth brushing, flossing, and dental visits.  Will consider sending in RX OCPs once negative pap results      RESOLVED: Encounter for initial prescription of contraceptive pills            Other Visit Diagnoses     Vaginal discharge       Relevant Orders   Cytology - PAP   WET PREP BY MOLECULAR PROBE   Need for shingles vaccine       Relevant Orders   Varicella-zoster vaccine IM (Completed)   Need for influenza vaccination       Relevant Orders   Flu Vaccine QUAD 42moIM (Fluarix, Fluzone & Alfiuria Quad PF) (Completed)       No orders of the defined types were placed in this encounter.   Follow-up: Return in about 6 months (around 11/10/2022) for f/u cholesterol.    TEugenia Pancoast FNP

## 2022-05-12 NOTE — Assessment & Plan Note (Signed)
Pt verbalized consent for pelvic exam Declines std testing hpv thin prep ordered and pending results Pap exam in office completed, pt tolerated well.   

## 2022-05-12 NOTE — Assessment & Plan Note (Signed)
Work on diet and exercise as tolerated  ?

## 2022-05-12 NOTE — Assessment & Plan Note (Signed)
Patient Counseling(The following topics were reviewed):  Preventative care handout given to pt  Health maintenance and immunizations reviewed. Please refer to Health maintenance section. Pt advised on safe sex, wearing seatbelts in car, and proper nutrition labwork ordered today for annual Dental health: Discussed importance of regular tooth brushing, flossing, and dental visits.  Will consider sending in RX OCPs once negative pap results

## 2022-05-12 NOTE — Assessment & Plan Note (Signed)
Mammogram ordered. Pending results. 

## 2022-05-12 NOTE — Patient Instructions (Addendum)
Complete your cologuard to send in and get done with this! Exercising to Lose Weight Getting regular exercise is important for everyone. It is especially important if you are overweight. Being overweight increases your risk of heart disease, stroke, diabetes, high blood pressure, and several types of cancer. Exercising, and reducing the calories you consume, can help you lose weight and improve fitness and health. Exercise can be moderate or vigorous intensity. To lose weight, most people need to do a certain amount of moderate or vigorous-intensity exercise each week. How can exercise affect me? You lose weight when you exercise enough to burn more calories than you eat. Exercise also reduces body fat and builds muscle. The more muscle you have, the more calories you burn. Exercise also: Improves mood. Reduces stress and tension. Improves your overall fitness, flexibility, and endurance. Increases bone strength. Moderate-intensity exercise  Moderate-intensity exercise is any activity that gets you moving enough to burn at least three times more energy (calories) than if you were sitting. Examples of moderate exercise include: Walking a mile in 15 minutes. Doing light yard work. Biking at an easy pace. Most people should get at least 150 minutes of moderate-intensity exercise a week to maintain their body weight. Vigorous-intensity exercise Vigorous-intensity exercise is any activity that gets you moving enough to burn at least six times more calories than if you were sitting. When you exercise at this intensity, you should be working hard enough that you are not able to carry on a conversation. Examples of vigorous exercise include: Running. Playing a team sport, such as football, basketball, and soccer. Jumping rope. Most people should get at least 75 minutes a week of vigorous exercise to maintain their body weight. What actions can I take to lose weight? The amount of exercise you need  to lose weight depends on: Your age. The type of exercise. Any health conditions you have. Your overall physical ability. Talk to your health care provider about how much exercise you need and what types of activities are safe for you. Nutrition  Make changes to your diet as told by your health care provider or diet and nutrition specialist (dietitian). This may include: Eating fewer calories. Eating more protein. Eating less unhealthy fats. Eating a diet that includes fresh fruits and vegetables, whole grains, low-fat dairy products, and lean protein. Avoiding foods with added fat, salt, and sugar. Drink plenty of water while you exercise to prevent dehydration or heat stroke. Activity Choose an activity that you enjoy and set realistic goals. Your health care provider can help you make an exercise plan that works for you. Exercise at a moderate or vigorous intensity most days of the week. The intensity of exercise may vary from person to person. You can tell how intense a workout is for you by paying attention to your breathing and heartbeat. Most people will notice their breathing and heartbeat get faster with more intense exercise. Do resistance training twice each week, such as: Push-ups. Sit-ups. Lifting weights. Using resistance bands. Getting short amounts of exercise can be just as helpful as long, structured periods of exercise. If you have trouble finding time to exercise, try doing these things as part of your daily routine: Get up, stretch, and walk around every 30 minutes throughout the day. Go for a walk during your lunch break. Park your car farther away from your destination. If you take public transportation, get off one stop early and walk the rest of the way. Make phone calls while standing up  and walking around. Take the stairs instead of elevators or escalators. Wear comfortable clothes and shoes with good support. Do not exercise so much that you hurt yourself,  feel dizzy, or get very short of breath. Where to find more information [place]. Department of Health and Human Services: [employer].at Centers for Disease Control and Prevention: http://www.wolf.info/ Contact a health care provider: Before starting a new exercise program. If you have questions or concerns about your weight. If you have a medical problem that keeps you from exercising. Get help right away if: You have any of the following while exercising: Injury. Dizziness. Difficulty breathing or shortness of breath that does not go away when you stop exercising. Chest pain. Rapid heartbeat. These symptoms may represent a serious problem that is an emergency. Do not wait to see if the symptoms will go away. Get medical help right away. Call your local emergency services (911 in the [place] Do not drive yourself to the hospital. Summary Getting regular exercise is especially important if you are overweight. Being overweight increases your risk of heart disease, stroke, diabetes, high blood pressure, and several types of cancer. Losing weight happens when you burn more calories than you eat. Reducing the amount of calories you eat, and getting regular moderate or vigorous exercise each week, helps you lose weight. This information is not intended to replace advice given to you by your health care provider. Make sure you discuss any questions you have with your health care provider. Document Revised: 09/27/2020 Document Reviewed: 09/27/2020 Elsevier Patient Education  [place].   ------------------------------------  I have sent an electronic order over to your preferred location for the following:  DO NOT SCHEDULE UNTIL AFTER 07/28/21  '[]'$   2D Mammogram  '[x]'$   3D Mammogram  '[]'$   Bone Density   Please give this center a call to get scheduled at your convenience.  '[x]'$   [facility]  [place]  [address]  [phone]  Make sure to wear two piece  clothing  No lotions powders or deodorants the day of the appointment Make sure to bring picture ID and insurance card.  Bring list of medications you are currently taking including any supplements.    ------------------------------------   Stop by the lab prior to leaving today. I will notify you of your results once received.   Recommendations on keeping yourself healthy:  - Exercise at least 30-45 minutes a day, 3-4 days a week.  - Eat a low-fat diet with lots of fruits and vegetables, up to 7-9 servings per day.  - Seatbelts can save your life. Wear them always.  - Smoke detectors on every level of your home, check batteries every year.  - Eye Doctor - have an eye exam every 1-2 years  - Safe sex - if you may be exposed to STDs, use a condom.  - Alcohol -  If you drink, do it moderately, less than 2 drinks per day.  - [place]. Choose someone to speak for you if you are not able.  - Depression is common in our stressful world.If you're feeling down or losing interest in things you normally enjoy, please come in for a visit.  - Violence - If anyone is threatening or hurting you, please call immediately.  Due to recent changes in healthcare laws, you may see results of your imaging and/or laboratory studies on MyChart before I have had a chance  to review them.  I understand that in some cases there may be results that are confusing or concerning to you. Please understand that not all results are received at the same time and often I may need to interpret multiple results in order to provide you with the best plan of care or course of treatment. Therefore, I ask that you please give me 2 business days to thoroughly review all your results before contacting my office for clarification. Should we see a critical lab result, you will be contacted sooner.   I will see you again in one year for your annual comprehensive  exam unless otherwise stated and or with acute concerns.  It was a pleasure seeing you today! Please do not hesitate to reach out with any questions and or concerns.  Regards,   [name]

## 2022-05-13 LAB — WET PREP BY MOLECULAR PROBE
Candida species: NOT DETECTED
Gardnerella vaginalis: NOT DETECTED
MICRO NUMBER:: 13981490
SPECIMEN QUALITY:: ADEQUATE
Trichomonas vaginosis: NOT DETECTED

## 2022-05-16 LAB — CYTOLOGY - PAP
Adequacy: ABSENT
Comment: NEGATIVE
Diagnosis: NEGATIVE
High risk HPV: NEGATIVE

## 2022-05-18 MED ORDER — FLUCONAZOLE 150 MG PO TABS: 150.0000 mg | ORAL_TABLET | Freq: Once | ORAL | 0 refills | Status: AC

## 2022-05-18 MED ORDER — METRONIDAZOLE 500 MG PO TABS: 500.0000 mg | ORAL_TABLET | Freq: Two times a day (BID) | ORAL | 0 refills | Status: AC

## 2022-05-18 MED ORDER — HAILEY 24 FE 1-20 MG-MCG(24) PO TABS: 1.0000 | ORAL_TABLET | Freq: Every day | ORAL | 11 refills | Status: DC

## 2022-05-18 NOTE — Addendum Note (Signed)
Addended by: [name] on: 05/18/2022 12:10 PM   Modules accepted: Orders

## 2022-05-24 ENCOUNTER — Encounter: Payer: Self-pay | Admitting: Family

## 2022-05-27 DIAGNOSIS — M25571 Pain in right ankle and joints of right foot: Secondary | ICD-10-CM | POA: Diagnosis not present

## 2022-05-27 DIAGNOSIS — R2689 Other abnormalities of gait and mobility: Secondary | ICD-10-CM | POA: Diagnosis not present

## 2022-05-30 DIAGNOSIS — R2689 Other abnormalities of gait and mobility: Secondary | ICD-10-CM | POA: Diagnosis not present

## 2022-05-30 DIAGNOSIS — Z1211 Encounter for screening for malignant neoplasm of colon: Secondary | ICD-10-CM | POA: Diagnosis not present

## 2022-05-30 DIAGNOSIS — M25571 Pain in right ankle and joints of right foot: Secondary | ICD-10-CM | POA: Diagnosis not present

## 2022-05-30 LAB — COLOGUARD: Cologuard: NEGATIVE

## 2022-06-04 LAB — COLOGUARD: COLOGUARD: NEGATIVE

## 2022-06-06 ENCOUNTER — Ambulatory Visit: Payer: BC Managed Care – PPO | Admitting: Family Medicine

## 2022-06-15 DIAGNOSIS — R2689 Other abnormalities of gait and mobility: Secondary | ICD-10-CM | POA: Diagnosis not present

## 2022-06-15 DIAGNOSIS — M25571 Pain in right ankle and joints of right foot: Secondary | ICD-10-CM | POA: Diagnosis not present

## 2022-06-17 DIAGNOSIS — M25571 Pain in right ankle and joints of right foot: Secondary | ICD-10-CM | POA: Diagnosis not present

## 2022-06-17 DIAGNOSIS — R2689 Other abnormalities of gait and mobility: Secondary | ICD-10-CM | POA: Diagnosis not present

## 2022-06-20 DIAGNOSIS — M25571 Pain in right ankle and joints of right foot: Secondary | ICD-10-CM | POA: Diagnosis not present

## 2022-06-20 DIAGNOSIS — R2689 Other abnormalities of gait and mobility: Secondary | ICD-10-CM | POA: Diagnosis not present

## 2022-06-23 DIAGNOSIS — M25571 Pain in right ankle and joints of right foot: Secondary | ICD-10-CM | POA: Diagnosis not present

## 2022-07-28 ENCOUNTER — Other Ambulatory Visit: Payer: Self-pay | Admitting: Family

## 2022-07-28 DIAGNOSIS — I10 Essential (primary) hypertension: Secondary | ICD-10-CM

## 2022-08-29 ENCOUNTER — Ambulatory Visit: Payer: BC Managed Care – PPO | Admitting: Nurse Practitioner

## 2022-09-01 ENCOUNTER — Encounter: Payer: Self-pay | Admitting: Family

## 2022-09-01 ENCOUNTER — Ambulatory Visit (INDEPENDENT_AMBULATORY_CARE_PROVIDER_SITE_OTHER)
Admission: RE | Admit: 2022-09-01 | Discharge: 2022-09-01 | Disposition: A | Discharge status: Home / Self Care | Payer: BC Managed Care – PPO | Source: Ambulatory Visit | Attending: Family | Admitting: Family

## 2022-09-01 ENCOUNTER — Ambulatory Visit: Payer: BC Managed Care – PPO | Admitting: Family

## 2022-09-01 VITALS — BP 124/76 | HR 96 | Temp 98.8°F | Ht 66.0 in | Wt 246.0 lb

## 2022-09-01 DIAGNOSIS — M79671 Pain in right foot: Secondary | ICD-10-CM | POA: Diagnosis not present

## 2022-09-01 NOTE — Patient Instructions (Signed)
Voltaren gel over the counter.  Heat to site.  Ibuprofen as needed.  See orthopedist if no improvement. Wear supportive shoes.    Regards,   [name] FNP-C

## 2022-09-01 NOTE — Assessment & Plan Note (Addendum)
Xray right knee ordered and pending.  Suspected arthritic changes, r/o trauma or small fracture, or possible mortons.  Advised pt if no improvement can see orthopedist that already established with. Voltaren gel, heat ice to site. Ibuprofen prn. Supportive shoes.

## 2022-09-01 NOTE — Progress Notes (Signed)
Established Patient Office Visit  Subjective:   Patient ID: Amy Watson [place], female    DOB: 1971/03/23  Age: 52 y.o. MRN: [id number]  CC:  Chief Complaint  Patient presents with   Ankle Pain    HPI: Amy Watson is a 52 y.o. female presenting on 09/01/2022 for Ankle Pain   Ankle Pain     Last week right foot had some swelling and she states it is painful. She has been trying to let it rest which has provided some relief, but if she touches the area of pain she can feel it. She states the pain is on the left to middle anterior foot. She doesn't recall an injury or trauma to site.  She does have h/o mortons neuroma and she had surgical removal about 52 y/o.     Ibuprofen tylenol helps a bit.    ROS: Negative unless specifically indicated above in HPI.   Relevant past medical history reviewed and updated as indicated.   Allergies and medications reviewed and updated.   Current Outpatient Medications:    Calcium Carbonate+Vitamin D (CALCIUM 600+D3) 600-200 MG-UNIT TABS, Take 1 tablet by mouth in the morning and at bedtime., Disp: 180 tablet, Rfl: 3   celecoxib (CELEBREX) 200 MG capsule, Take 200 mg by mouth 2 (two) times daily., Disp: , Rfl:    cetirizine (ZYRTEC) 10 MG tablet, Take 10 mg by mouth as needed. , Disp: , Rfl:    Cholecalciferol (VITAMIN D3) 1.25 MG (50000 UT) CAPS, TAKE 1 TABLET BY MOUTH ONCE A WEEK., Disp: 4 capsule, Rfl: 0   cyclobenzaprine (FLEXERIL) 10 MG tablet, Take 1 tablet (10 mg total) by mouth 3 (three) times daily as needed for muscle spasms., Disp: 30 tablet, Rfl: 0   escitalopram (LEXAPRO) 20 MG tablet, TAKE 1 TABLET BY MOUTH EVERY DAY, Disp: 90 tablet, Rfl: 3   fluticasone furoate-vilanterol (BREO ELLIPTA) 200-25 MCG/INH AEPB, Inhale 1 puff into the lungs daily., Disp: , Rfl:    hydrochlorothiazide (HYDRODIURIL) 25 MG tablet, TAKE 1 TABLET (25 MG TOTAL) BY MOUTH DAILY., Disp: 90 tablet, Rfl: 1   lisinopril (ZESTRIL) 40 MG tablet, Take 1 tablet  (40 mg total) by mouth daily. for blood pressure., Disp: 90 tablet, Rfl: 0   montelukast (SINGULAIR) 10 MG tablet, Take by mouth as needed., Disp: , Rfl:    PROAIR HFA 108 (90 Base) MCG/ACT inhaler, TAKE 1 2 PUFFS AS NEEDED EVERY 4 6 HOURS AS NEEDED FOR COUGH/WHEEZE, Disp: , Rfl:    Vitamin D, Ergocalciferol, (DRISDOL) 1.25 MG (50000 UNIT) CAPS capsule, TAKE 1 CAPSULE BY MOUTH ONCE A WEEK FOR 12 WEEKS Oral for 84, Disp: , Rfl:    Norethindrone Acetate-Ethinyl Estrad-FE ([name] 24 FE) 1-20 MG-MCG(24) tablet, Take 1 tablet by mouth daily., Disp: 28 tablet, Rfl: 11   phentermine 15 MG capsule, Take 15 mg by mouth every morning. (Patient not taking: Reported on 09/01/2022), Disp: , Rfl:    topiramate (TOPAMAX) 25 MG tablet, PLEASE SEE ATTACHED FOR DETAILED DIRECTIONS (Patient not taking: Reported on 09/01/2022), Disp: , Rfl:   Allergies  Allergen Reactions   Prozac [Fluoxetine] Other (See Comments)    fatigue   Azithromycin Other (See Comments)    C. Diff./  Pain     Objective:   BP 124/76   Pulse 96   Temp 98.8 F (37.1 C) (Oral)   Ht '5\' 6"'$  (1.676 m)   Wt 246 lb (111.6 kg)   SpO2 98%   BMI 39.71 kg/m  Physical Exam Vitals reviewed.  Constitutional:      General: She is not in acute distress.    Appearance: Normal appearance. She is normal weight. She is not ill-appearing, toxic-appearing or diaphoretic.  HENT:     Head: Normocephalic.  Cardiovascular:     Rate and Rhythm: Normal rate.  Pulmonary:     Effort: Pulmonary effort is normal.  Musculoskeletal:     Right foot: Decreased range of motion (painful rom slight with flexion). Swelling (base of right anterior medical foot directly between 1st and 2nd metatarsal) and tenderness (spot tenderness between 1 and 2 metatarsal) present.  Neurological:     General: No focal deficit present.     Mental Status: She is alert and oriented to person, place, and time. Mental status is at baseline.  Psychiatric:        Mood and Affect:  Mood normal.        Behavior: Behavior normal.        Thought Content: Thought content normal.        Judgment: Judgment normal.     Assessment & Plan:  Right foot pain Assessment & Plan: Xray right knee ordered and pending.  Suspected arthritic changes, r/o trauma or small fracture, or possible mortons.  Advised pt if no improvement can see orthopedist that already established with. Voltaren gel, heat ice to site. Ibuprofen prn. Supportive shoes.   Orders: -     DG Foot Complete Right; Future     Follow up plan: Return in about 3 months (around 12/01/2022) for f/u cholesterol.  Amy Pancoast, FNP

## 2022-09-12 DIAGNOSIS — M79671 Pain in right foot: Secondary | ICD-10-CM | POA: Diagnosis not present

## 2022-09-12 DIAGNOSIS — M25571 Pain in right ankle and joints of right foot: Secondary | ICD-10-CM | POA: Diagnosis not present

## 2022-11-09 ENCOUNTER — Encounter: Payer: Self-pay | Admitting: Family

## 2022-11-10 ENCOUNTER — Encounter: Payer: Self-pay | Admitting: *Deleted

## 2022-11-10 ENCOUNTER — Ambulatory Visit: Payer: BC Managed Care – PPO | Admitting: Family

## 2022-11-10 VITALS — BP 138/84 | HR 87 | Temp 98.3°F | Ht 66.0 in | Wt 248.6 lb

## 2022-11-10 DIAGNOSIS — R197 Diarrhea, unspecified: Secondary | ICD-10-CM

## 2022-11-10 DIAGNOSIS — R1011 Right upper quadrant pain: Secondary | ICD-10-CM | POA: Diagnosis not present

## 2022-11-10 LAB — BASIC METABOLIC PANEL
BUN: 13 mg/dL (ref 6–23)
CO2: 25 mEq/L (ref 19–32)
Calcium: 9.4 mg/dL (ref 8.4–10.5)
Chloride: 102 mEq/L (ref 96–112)
Creatinine, Ser: 0.62 mg/dL (ref 0.40–1.20)
GFR: 101.96 mL/min (ref >60.00)
Glucose, Bld: 97 mg/dL (ref 70–99)
Potassium: 3.8 mEq/L (ref 3.5–5.1)
Sodium: 135 mEq/L (ref 135–145)

## 2022-11-10 LAB — CBC WITH DIFFERENTIAL/PLATELET
Basophils Absolute: 0.1 10*3/uL (ref 0.0–0.1)
Basophils Relative: 1.1 % (ref 0.0–3.0)
Eosinophils Absolute: 0.1 10*3/uL (ref 0.0–0.7)
Eosinophils Relative: 1.1 % (ref 0.0–5.0)
HCT: 42.6 % (ref 36.0–46.0)
Hemoglobin: 14.7 g/dL (ref 12.0–15.0)
Lymphocytes Relative: 18 % (ref 12.0–46.0)
Lymphs Abs: 1.8 10*3/uL (ref 0.7–4.0)
MCHC: 34.6 g/dL (ref 30.0–36.0)
MCV: 89.8 fl (ref 78.0–100.0)
Monocytes Absolute: 0.9 10*3/uL (ref 0.1–1.0)
Monocytes Relative: 9.7 % (ref 3.0–12.0)
Neutro Abs: 6.9 10*3/uL (ref 1.4–7.7)
Neutrophils Relative %: 70.1 % (ref 43.0–77.0)
Platelets: 351 10*3/uL (ref 150.0–400.0)
RBC: 4.74 Mil/uL (ref 3.87–5.11)
RDW: 12.9 % (ref 11.5–15.5)
WBC: 9.8 10*3/uL (ref 4.0–10.5)

## 2022-11-10 LAB — AMYLASE: Amylase: 29 U/L (ref 27–131)

## 2022-11-10 LAB — LIPASE: Lipase: 48 U/L (ref 11.0–59.0)

## 2022-11-10 NOTE — Addendum Note (Signed)
Addended by: Ellamae Sia on: 11/10/2022 12:59 PM   Modules accepted: Orders

## 2022-11-10 NOTE — Progress Notes (Signed)
Established Patient Office Visit  Subjective:   Patient ID: Amy Watson Overall, female    DOB: 01/17/1971  Age: 52 y.o. MRN: [id number]  CC:  Chief Complaint  Patient presents with   Diarrhea    Symptoms for a couple of months.    HPI: Amy Watson is a 52 y.o. female presenting on 11/10/2022 for Diarrhea (Symptoms for a couple of months.)   Diarrhea     C/o diarrhea over the last few months, about 4-5 months ago they started. Not normal for her.  She states stool is soft and or liquid without solid stool.  Have since increased in frequency. No blood seen in stool.  At times she will have to go twice in a row as well.   No abdominal pain.  No burping or flatulence Has tried to alter her diet without much relief.  Did try pepto bismal but no relief.   Guesstimates about on average  Denies fatigue.    Cologuard negative.   ROS: Negative unless specifically indicated above in HPI.   Relevant past medical history reviewed and updated as indicated.   Allergies and medications reviewed and updated.   Current Outpatient Medications:    Calcium Carbonate+Vitamin D (CALCIUM 600+D3) 600-200 MG-UNIT TABS, Take 1 tablet by mouth in the morning and at bedtime., Disp: 180 tablet, Rfl: 3   celecoxib (CELEBREX) 200 MG capsule, Take 200 mg by mouth 2 (two) times daily., Disp: , Rfl:    cetirizine (ZYRTEC) 10 MG tablet, Take 10 mg by mouth as needed. , Disp: , Rfl:    Cholecalciferol (VITAMIN D3) 1.25 MG (50000 UT) CAPS, TAKE 1 TABLET BY MOUTH ONCE A WEEK., Disp: 4 capsule, Rfl: 0   cyclobenzaprine (FLEXERIL) 10 MG tablet, Take 1 tablet (10 mg total) by mouth 3 (three) times daily as needed for muscle spasms., Disp: 30 tablet, Rfl: 0   escitalopram (LEXAPRO) 20 MG tablet, TAKE 1 TABLET BY MOUTH EVERY DAY, Disp: 90 tablet, Rfl: 3   fluticasone furoate-vilanterol (BREO ELLIPTA) 200-25 MCG/INH AEPB, Inhale 1 puff into the lungs daily., Disp: , Rfl:    lisinopril (ZESTRIL) 40 MG  tablet, Take 1 tablet (40 mg total) by mouth daily. for blood pressure., Disp: 90 tablet, Rfl: 0   montelukast (SINGULAIR) 10 MG tablet, Take by mouth as needed., Disp: , Rfl:    Norethindrone Acetate-Ethinyl Estrad-FE ([name] 24 FE) 1-20 MG-MCG(24) tablet, Take 1 tablet by mouth daily., Disp: 28 tablet, Rfl: 11   phentermine 15 MG capsule, Take 15 mg by mouth every morning., Disp: , Rfl:    PROAIR HFA 108 (90 Base) MCG/ACT inhaler, TAKE 1 2 PUFFS AS NEEDED EVERY 4 6 HOURS AS NEEDED FOR COUGH/WHEEZE, Disp: , Rfl:    topiramate (TOPAMAX) 25 MG tablet, , Disp: , Rfl:    fluticasone furoate-vilanterol (BREO ELLIPTA) 200-25 MCG/ACT AEPB, 1 puff Inhalation Once a day for 30 days, Disp: , Rfl:    hydrochlorothiazide (HYDRODIURIL) 25 MG tablet, TAKE 1 TABLET (25 MG TOTAL) BY MOUTH DAILY., Disp: 90 tablet, Rfl: 1  Allergies  Allergen Reactions   Prozac [Fluoxetine] Other (See Comments)    fatigue   Azithromycin Other (See Comments)    C. Diff./  Pain     Objective:   BP 138/84   Pulse 87   Temp 98.3 F (36.8 C) (Temporal)   Ht 5\' 6"  (1.676 m)   Wt 248 lb 9.6 oz (112.8 kg)   SpO2 98%   BMI 40.13 kg/m  Physical Exam Constitutional:      General: She is not in acute distress.    Appearance: Normal appearance. She is normal weight. She is not ill-appearing, toxic-appearing or diaphoretic.  HENT:     Head: Normocephalic.  Cardiovascular:     Rate and Rhythm: Normal rate and regular rhythm.  Pulmonary:     Effort: Pulmonary effort is normal.  Abdominal:     General: Abdomen is flat. Bowel sounds are normal.     Tenderness: There is abdominal tenderness (ruq abd tenderness). There is no guarding. Positive signs include Murphy's sign.  Musculoskeletal:        General: Normal range of motion.  Neurological:     General: No focal deficit present.     Mental Status: She is alert and oriented to person, place, and time. Mental status is at baseline.  Psychiatric:        Mood and Affect:  Mood normal.        Behavior: Behavior normal.        Thought Content: Thought content normal.        Judgment: Judgment normal.     Assessment & Plan:  Diarrhea of presumed infectious origin Assessment & Plan: Stool cultures today pending results.  Increase fiber in diet.  If all workup negative will consider referral to GI. Can use imodium for symptom control however not likely due to chronic symptoms that this will resolve completely  Orders: -     Giardia antigen -     C. difficile GDH and Toxin A/B -     Gastrointestinal Pathogen Pnl RT, PCR -     Celiac Pnl 2 rflx Endomysial Ab Ttr -     CALPROTECTIN -     CBC with Differential/Platelet -     Amylase -     Lipase -     Basic metabolic panel -     US Abdomen Complete; Future  RUQ abdominal pain Assessment & Plan: Ultrasound first to r/o cholecystitis  Urgent recommendations given to pt.   Orders: -     CBC with Differential/Platelet -     Amylase -     Lipase -     Basic metabolic panel -     US Abdomen Complete; Future     Follow up plan: Return for f/u CPE has to be after 05/13/23.  Amy Pancoast, FNP

## 2022-11-10 NOTE — Addendum Note (Signed)
Addended by: Ellamae Sia on: 11/10/2022 12:48 PM   Modules accepted: Orders

## 2022-11-10 NOTE — Patient Instructions (Addendum)
I have ordered imaging for you at [facility] outpatient diagnostic center for ultrasound gallbladder. This order has been sent over for you electronically.  Please call [phone] to schedule this appointment.  Stop by the lab prior to leaving today. I will notify you of your results once received.   Follow a fod diet, stool cultures for now.   Regards,   [name] FNP-C

## 2022-11-10 NOTE — Assessment & Plan Note (Signed)
Stool cultures today pending results.  Increase fiber in diet.  If all workup negative will consider referral to GI. Can use imodium for symptom control however not likely due to chronic symptoms that this will resolve completely

## 2022-11-10 NOTE — Assessment & Plan Note (Signed)
Ultrasound first to r/o cholecystitis  Urgent recommendations given to pt.

## 2022-11-16 ENCOUNTER — Ambulatory Visit
Admission: RE | Admit: 2022-11-16 | Discharge: 2022-11-16 | Disposition: A | Discharge status: Home / Self Care | Payer: BC Managed Care – PPO | Source: Ambulatory Visit | Attending: Family | Admitting: Family

## 2022-11-16 DIAGNOSIS — Z1231 Encounter for screening mammogram for malignant neoplasm of breast: Secondary | ICD-10-CM | POA: Diagnosis not present

## 2022-11-23 LAB — CELIAC PNL 2 RFLX ENDOMYSIAL AB TTR
(tTG) Ab, IgA: <1 U/mL
(tTG) Ab, IgG: <1 U/mL
Endomysial Ab IgA: NEGATIVE
Gliadin IgA: 1 U/mL
Gliadin IgG: <1 U/mL
Immunoglobulin A: 175 mg/dL (ref 47–310)

## 2022-12-05 ENCOUNTER — Other Ambulatory Visit: Payer: Self-pay | Admitting: Family

## 2022-12-05 ENCOUNTER — Other Ambulatory Visit: Payer: Self-pay | Admitting: Primary Care

## 2022-12-05 DIAGNOSIS — I10 Essential (primary) hypertension: Secondary | ICD-10-CM

## 2022-12-05 NOTE — Telephone Encounter (Signed)
hydrochlorothiazide (HYDRODIURIL) 25 MG tablet   LV- 11/10/22 NV- Not Scheduled LR- 04/04/22 ( 90 tabs/ 1 refill)

## 2022-12-05 NOTE — Telephone Encounter (Signed)
noted 

## 2022-12-06 ENCOUNTER — Telehealth: Payer: BC Managed Care – PPO | Admitting: Family

## 2022-12-06 ENCOUNTER — Encounter: Payer: Self-pay | Admitting: Family

## 2022-12-06 VITALS — BP 128/76 | HR 88 | Temp 98.1°F | Ht 66.0 in | Wt 249.6 lb

## 2022-12-06 DIAGNOSIS — R062 Wheezing: Secondary | ICD-10-CM

## 2022-12-06 DIAGNOSIS — R6889 Other general symptoms and signs: Secondary | ICD-10-CM

## 2022-12-06 DIAGNOSIS — J4521 Mild intermittent asthma with (acute) exacerbation: Secondary | ICD-10-CM | POA: Diagnosis not present

## 2022-12-06 DIAGNOSIS — J45909 Unspecified asthma, uncomplicated: Secondary | ICD-10-CM | POA: Insufficient documentation

## 2022-12-06 DIAGNOSIS — R051 Acute cough: Secondary | ICD-10-CM | POA: Diagnosis not present

## 2022-12-06 LAB — POCT INFLUENZA A/B
Influenza A, POC: NEGATIVE
Influenza B, POC: NEGATIVE

## 2022-12-06 LAB — POCT RAPID STREP A (OFFICE): Rapid Strep A Screen: NEGATIVE

## 2022-12-06 MED ORDER — AMOXICILLIN-POT CLAVULANATE 875-125 MG PO TABS
1.0000 | ORAL_TABLET | Freq: Two times a day (BID) | ORAL | 0 refills | Status: DC
Start: 2022-12-06 — End: 2023-01-04

## 2022-12-06 MED ORDER — PREDNISONE 20 MG PO TABS
ORAL_TABLET | ORAL | 0 refills | Status: DC
Start: 2022-12-06 — End: 2023-01-04

## 2022-12-06 MED ORDER — GUAIFENESIN-CODEINE 100-10 MG/5ML PO SYRP
5.0000 mL | ORAL_SOLUTION | Freq: Three times a day (TID) | ORAL | 0 refills | Status: AC | PRN
Start: 2022-12-06 — End: 2022-12-11

## 2022-12-06 MED ORDER — BENZONATATE 200 MG PO CAPS
200.0000 mg | ORAL_CAPSULE | Freq: Two times a day (BID) | ORAL | 0 refills | Status: AC | PRN
Start: 2022-12-06 — End: 2022-12-11

## 2022-12-06 NOTE — Progress Notes (Signed)
Virtual Visit via Video note  I connected with Amy Watson on 12/06/22 at [place] office by video and verified that I am speaking with the correct person using two identifiers. Dontavia L Self is currently located at [facility]. . The provider, Mort Sawyers, FNP is located in their home at time of visit.  I discussed the limitations, risks, security and privacy concerns of performing an evaluation and management service by video and the availability of in person appointments. I also discussed with the patient that there may be a patient responsible charge related to this service. The patient expressed understanding and agreed to proceed.  Subjective: PCP: Mort Sawyers, FNP  Chief Complaint  Patient presents with   Cough   Sore Throat    Cough  Sore Throat  Associated symptoms include coughing.    Two weeks with c/o cold like symptoms.  Ongoing cough that is dry/wet nonproductive cough with only mild chest congestion.  Some wheezing as she does have asthma She does have a mild sore throat  No nasal congestion or sinus pressure.  No fever or chills.  Has been taking dayquil and nyquil and mucinex with delsym night time with only mild relief.      ROS: Per HPI  Current Outpatient Medications:    amoxicillin-clavulanate (AUGMENTIN) 875-125 MG tablet, Take 1 tablet by mouth 2 (two) times daily., Disp: 20 tablet, Rfl: 0   benzonatate (TESSALON) 200 MG capsule, Take 1 capsule (200 mg total) by mouth 2 (two) times daily as needed for up to 5 days for cough., Disp: 30 capsule, Rfl: 0   Calcium Carbonate+Vitamin D (CALCIUM 600+D3) 600-200 MG-UNIT TABS, Take 1 tablet by mouth in the morning and at bedtime., Disp: 180 tablet, Rfl: 3   cetirizine (ZYRTEC) 10 MG tablet, Take 10 mg by mouth as needed. , Disp: , Rfl:    Cholecalciferol (VITAMIN D3) 1.25 MG (50000 UT) CAPS, TAKE 1 TABLET BY MOUTH ONCE A WEEK., Disp: 4 capsule, Rfl: 0   escitalopram  (LEXAPRO) 20 MG tablet, TAKE 1 TABLET BY MOUTH EVERY DAY, Disp: 90 tablet, Rfl: 3   fluticasone furoate-vilanterol (BREO ELLIPTA) 200-25 MCG/ACT AEPB, 1 puff Inhalation Once a day for 30 days, Disp: , Rfl:    guaiFENesin-codeine (ROBITUSSIN AC) 100-10 MG/5ML syrup, Take 5 mLs by mouth 3 (three) times daily as needed for up to 5 days for cough., Disp: 75 mL, Rfl: 0   hydrochlorothiazide (HYDRODIURIL) 25 MG tablet, TAKE 1 TABLET (25 MG TOTAL) BY MOUTH DAILY., Disp: 90 tablet, Rfl: 1   lisinopril (ZESTRIL) 40 MG tablet, TAKE 1 TABLET (40 MG TOTAL) BY MOUTH DAILY FOR BLOOD PRESSURE, Disp: 90 tablet, Rfl: 0   montelukast (SINGULAIR) 10 MG tablet, Take by mouth as needed., Disp: , Rfl:    Norethindrone Acetate-Ethinyl Estrad-FE ([name] 24 FE) 1-20 MG-MCG(24) tablet, Take 1 tablet by mouth daily., Disp: 28 tablet, Rfl: 11   predniSONE (DELTASONE) 20 MG tablet, Take two tablets once daily for five days, Disp: 10 tablet, Rfl: 0   PROAIR HFA 108 (90 Base) MCG/ACT inhaler, TAKE 1 2 PUFFS AS NEEDED EVERY 4 6 HOURS AS NEEDED FOR COUGH/WHEEZE, Disp: , Rfl:   Observations/Objective: Physical Exam Vitals reviewed.  Constitutional:      General: She is not in acute distress.    Appearance: Normal appearance. She is well-developed. She is not ill-appearing.  Pulmonary:     Effort: Pulmonary effort is normal.  Neurological:  General: No focal deficit present.     Mental Status: She is alert and oriented to person, place, and time.  Psychiatric:        Mood and Affect: Mood normal.        Behavior: Behavior normal.        Thought Content: Thought content normal.     Assessment and Plan: Flu-like symptoms -     POCT Influenza A/B -     POCT rapid strep A  Wheezing -     predniSONE; Take two tablets once daily for five days  Dispense: 10 tablet; Refill: 0  Acute cough -     predniSONE; Take two tablets once daily for five days  Dispense: 10 tablet; Refill: 0 -     guaiFENesin-Codeine; Take 5 mLs  by mouth 3 (three) times daily as needed for up to 5 days for cough.  Dispense: 75 mL; Refill: 0 -     Benzonatate; Take 1 capsule (200 mg total) by mouth 2 (two) times daily as needed for up to 5 days for cough.  Dispense: 30 capsule; Refill: 0  Mild intermittent asthmatic bronchitis with acute exacerbation Assessment & Plan: Take antibiotic as prescribed. Increase oral fluids. Pt to f/u if sx worsen and or fail to improve in 2-3 days. rx augmentin 875/125 mg po bid x 10 days Rx prednisone for asthma like symptoms Pdmp reviewed rx guai codeine, use only at night pt advised Tessalon perrles for cough in daytime  Orders: -     Amoxicillin-Pot Clavulanate; Take 1 tablet by mouth 2 (two) times daily.  Dispense: 20 tablet; Refill: 0    Follow Up Instructions: Return if symptoms worsen or fail to improve.   I discussed the assessment and treatment plan with the patient. The patient was provided an opportunity to ask questions and all were answered. The patient agreed with the plan and demonstrated an understanding of the instructions.   The patient was advised to call back or seek an in-person evaluation if the symptoms worsen or if the condition fails to improve as anticipated.  The above assessment and management plan was discussed with the patient. The patient verbalized understanding of and has agreed to the management plan. Patient is aware to call the clinic if symptoms persist or worsen. Patient is aware when to return to the clinic for a follow-up visit. Patient educated on when it is appropriate to go to the emergency department.    I provided 15 minutes of face-to-face time during this encounter.   Mort Sawyers, MSN, APRN, FNP-C [facility]

## 2022-12-06 NOTE — Assessment & Plan Note (Signed)
Take antibiotic as prescribed. Increase oral fluids. Pt to f/u if sx worsen and or fail to improve in 2-3 days. rx augmentin 875/125 mg po bid x 10 days Rx prednisone for asthma like symptoms Pdmp reviewed rx guai codeine, use only at night pt advised Tessalon perrles for cough in daytime

## 2022-12-09 ENCOUNTER — Other Ambulatory Visit: Payer: BC Managed Care – PPO

## 2022-12-09 ENCOUNTER — Encounter: Payer: Self-pay | Admitting: Family

## 2022-12-09 DIAGNOSIS — R197 Diarrhea, unspecified: Secondary | ICD-10-CM | POA: Diagnosis not present

## 2022-12-12 LAB — C. DIFFICILE GDH AND TOXIN A/B
GDH ANTIGEN: NOT DETECTED
MICRO NUMBER:: 14879807
TOXIN A AND B: NOT DETECTED

## 2022-12-12 LAB — GASTROINTESTINAL PATHOGEN PNL: Salmonella species: NOT DETECTED

## 2022-12-12 LAB — GIARDIA ANTIGEN: SPECIMEN QUALITY:: ADEQUATE

## 2022-12-13 ENCOUNTER — Encounter: Payer: Self-pay | Admitting: Family

## 2022-12-13 DIAGNOSIS — M25562 Pain in left knee: Secondary | ICD-10-CM | POA: Insufficient documentation

## 2022-12-13 LAB — C. DIFFICILE GDH AND TOXIN A/B

## 2022-12-13 LAB — GIARDIA ANTIGEN
MICRO NUMBER:: 14879757
RESULT:: NOT DETECTED

## 2022-12-13 LAB — GASTROINTESTINAL PATHOGEN PNL
Norovirus GI/GII: NOT DETECTED
Shiga Toxin 1: NOT DETECTED
Shiga Toxin 2: NOT DETECTED
Yersinia enterocolitica: NOT DETECTED

## 2022-12-14 DIAGNOSIS — M1712 Unilateral primary osteoarthritis, left knee: Secondary | ICD-10-CM | POA: Diagnosis not present

## 2022-12-16 LAB — C. DIFFICILE GDH AND TOXIN A/B: SPECIMEN QUALITY:: ADEQUATE

## 2022-12-16 LAB — CALPROTECTIN: Calprotectin: 61 mcg/g

## 2022-12-16 LAB — GIARDIA ANTIGEN

## 2022-12-16 LAB — GASTROINTESTINAL PATHOGEN PNL
CampyloBacter Group: NOT DETECTED
Rotavirus A: NOT DETECTED
Shigella Species: NOT DETECTED
Vibrio Group: NOT DETECTED

## 2022-12-19 ENCOUNTER — Ambulatory Visit
Admission: RE | Admit: 2022-12-19 | Discharge: 2022-12-19 | Disposition: A | Discharge status: Home / Self Care | Payer: BC Managed Care – PPO | Source: Ambulatory Visit | Attending: Family | Admitting: Family

## 2022-12-19 DIAGNOSIS — R197 Diarrhea, unspecified: Secondary | ICD-10-CM | POA: Diagnosis not present

## 2022-12-19 DIAGNOSIS — R1011 Right upper quadrant pain: Secondary | ICD-10-CM | POA: Diagnosis not present

## 2022-12-20 ENCOUNTER — Encounter: Payer: Self-pay | Admitting: Family

## 2022-12-20 DIAGNOSIS — K828 Other specified diseases of gallbladder: Secondary | ICD-10-CM

## 2022-12-20 DIAGNOSIS — R1011 Right upper quadrant pain: Secondary | ICD-10-CM

## 2022-12-22 DIAGNOSIS — J4530 Mild persistent asthma, uncomplicated: Secondary | ICD-10-CM | POA: Diagnosis not present

## 2022-12-22 DIAGNOSIS — J301 Allergic rhinitis due to pollen: Secondary | ICD-10-CM | POA: Diagnosis not present

## 2022-12-22 DIAGNOSIS — J3089 Other allergic rhinitis: Secondary | ICD-10-CM | POA: Diagnosis not present

## 2023-01-04 ENCOUNTER — Ambulatory Visit (INDEPENDENT_AMBULATORY_CARE_PROVIDER_SITE_OTHER): Payer: BC Managed Care – PPO | Admitting: Surgery

## 2023-01-04 ENCOUNTER — Encounter: Payer: Self-pay | Admitting: Surgery

## 2023-01-04 VITALS — BP 137/82 | HR 123 | Temp 98.4°F | Ht 66.0 in | Wt 240.6 lb

## 2023-01-04 DIAGNOSIS — K828 Other specified diseases of gallbladder: Secondary | ICD-10-CM | POA: Diagnosis not present

## 2023-01-04 MED ORDER — URSODIOL 300 MG PO CAPS: 300.0000 mg | ORAL_CAPSULE | Freq: Three times a day (TID) | ORAL | 2 refills | Status: DC

## 2023-01-04 NOTE — Patient Instructions (Addendum)
If you have any concerns or questions, please feel free to call our office. See follow up appointment.   Gallbladder Eating Plan  High blood cholesterol, obesity, a sedentary lifestyle, an unhealthy diet, and diabetes are risk factors for developing gallstones. If you have a gallbladder condition, you may have trouble digesting fats and tolerating high fat intake. Eating a low-fat diet can help reduce your symptoms and may be helpful before and after having surgery to remove your gallbladder (cholecystectomy). Your health care provider may recommend that you work with a dietitian to help you reduce the amount of fat in your diet. What are tips for following this plan? General guidelines Limit your fat intake to less than 30% of your total daily calories. If you eat around 1,800 calories each day, this means eating less than 60 grams (g) of fat per day. Fat is an important part of a healthy diet. Eating a low-fat diet can make it hard to maintain a healthy body weight. Ask your dietitian how much fat, calories, and other nutrients you need each day. Eat small, frequent meals throughout the day instead of three large meals. Drink at least 8-10 cups (1.9-2.4 L) of fluid a day. Drink enough fluid to keep your urine pale yellow. If you drink alcohol: Limit how much you have to: 0-1 drink a day for women who are not pregnant. 0-2 drinks a day for men. Know how much alcohol is in a drink. In the [place] one drink equals one 12 oz bottle of beer (355 mL), one 5 oz glass of wine (148 mL), or one 1 oz glass of hard liquor (44 mL). Reading food labels  Check nutrition facts on food labels for the amount of fat per serving. Choose foods with less than 3 grams of fat per serving. Shopping Choose nonfat and low-fat healthy foods. Look for the words "nonfat," "low-fat," or "fat-free." Avoid buying processed or prepackaged foods. Cooking Cook using low-fat methods, such as baking, broiling, grilling, or  boiling. Cook with small amounts of healthy fats, such as olive oil, grapeseed oil, canola oil, avocado oil, or sunflower oil. What foods are recommended?  All fresh, frozen, or canned fruits and vegetables. Whole grains. Low-fat or nonfat (skim) milk and yogurt. Lean meat, skinless poultry, fish, eggs, and beans. Low-fat protein supplement powders or drinks. Spices and herbs. The items listed above may not be a complete list of foods and beverages you can eat and drink. Contact a dietitian for more information. What foods are not recommended? High-fat foods. These include baked goods, fast food, fatty cuts of meat, ice cream, french toast, sweet rolls, pizza, cheese bread, foods covered with butter, creamy sauces, or cheese. Fried foods. These include french fries, tempura, battered fish, breaded chicken, fried breads, and sweets. Foods that cause bloating and gas. The items listed above may not be a complete list of foods that you should avoid. Contact a dietitian for more information. Summary A low-fat diet can be helpful if you have a gallbladder condition, or before and after gallbladder surgery. Limit your fat intake to less than 30% of your total daily calories. This is about 60 g of fat if you eat 1,800 calories each day. Eat small, frequent meals throughout the day instead of three large meals. This information is not intended to replace advice given to you by your health care provider. Make sure you discuss any questions you have with your health care provider. Document Revised: 07/16/2021 Document Reviewed: 07/16/2021 Elsevier Patient  Education  2023 [employer].

## 2023-01-05 ENCOUNTER — Encounter: Payer: Self-pay | Admitting: Surgery

## 2023-01-05 NOTE — Progress Notes (Signed)
01/05/2023  Reason for Visit:  Biliary sludge  Requesting Provider:  Mort Sawyers, FNP  History of Present Illness: Amy Watson is a 52 y.o. female presenting for evaluation of biliary sludge.  The patient reports that she's had diarrhea and abdominal pain for a few months now.  She reports that the pain happens when she eats greasy/fatty foods.  She has made some dietary changes and the diarrhea is improved.  The pain is mostly upper abdomen/RUQ, without any radiation.  Denies any nausea or vomiting.  She's had workup for her diarrhea including testing for Celiac disease and GI pathogen stool studies, c-diff testing, which have all been negative.  She had an ultrasound on 12/19/22 which showed biliary sludge vs polyp.    Past Medical History: Past Medical History:  Diagnosis Date   Allergy    Asthma    Hypertension    Obesity      Past Surgical History: Past Surgical History:  Procedure Laterality Date   FOOT SURGERY     right, mortons neuroma rescetion   FOOT SURGERY     right plantar fascia torn   NECK SURGERY     laser surgery    Home Medications: Prior to Admission medications   Medication Sig Start Date End Date Taking? Authorizing Provider  Calcium Carbonate+Vitamin D (CALCIUM 600+D3) 600-200 MG-UNIT TABS Take 1 tablet by mouth in the morning and at bedtime. 03/23/21  Yes Gweneth Dimitri, MD  cetirizine (ZYRTEC) 10 MG tablet Take 10 mg by mouth as needed.    Yes [provider]  Cholecalciferol (VITAMIN D3) 1.25 MG (50000 UT) CAPS TAKE 1 TABLET BY MOUTH ONCE A WEEK. 07/01/20  Yes Gweneth Dimitri, MD  escitalopram (LEXAPRO) 20 MG tablet TAKE 1 TABLET BY MOUTH EVERY DAY 03/21/22  Yes Dugal, Tabitha, FNP  fluticasone furoate-vilanterol (BREO ELLIPTA) 200-25 MCG/ACT AEPB 1 puff Inhalation Once a day for 30 days   Yes [provider]  hydrochlorothiazide (HYDRODIURIL) 25 MG tablet TAKE 1 TABLET (25 MG TOTAL) BY MOUTH DAILY. 12/05/22 06/03/23 Yes Dugal, Tabitha,  FNP  lisinopril (ZESTRIL) 40 MG tablet TAKE 1 TABLET (40 MG TOTAL) BY MOUTH DAILY FOR BLOOD PRESSURE 12/05/22  Yes Dugal, Tabitha, FNP  montelukast (SINGULAIR) 10 MG tablet Take by mouth as needed.   Yes [provider]  Norethindrone Acetate-Ethinyl Estrad-FE ([name] 24 FE) 1-20 MG-MCG(24) tablet Take 1 tablet by mouth daily. 05/18/22  Yes Dugal, Tabitha, FNP  PROAIR HFA 108 (90 Base) MCG/ACT inhaler TAKE 1 2 PUFFS AS NEEDED EVERY 4 6 HOURS AS NEEDED FOR COUGH/WHEEZE 11/08/18  Yes [provider]  ursodiol (ACTIGALL) 300 MG capsule Take 1 capsule (300 mg total) by mouth 3 (three) times daily. 01/04/23  Yes Henrene Dodge, MD    Allergies: Allergies  Allergen Reactions   Prozac [Fluoxetine] Other (See Comments)    fatigue   Azithromycin Other (See Comments)    C. Diff./  Pain     Social History:  reports that she has never smoked. She has never used smokeless tobacco. She reports current alcohol use. She reports that she does not use drugs.   Family History: Family History  Problem Relation Age of Onset   Breast cancer Mother 69   Diabetes Father    Hypertension Father    Skin cancer Maternal Grandfather     Review of Systems: Review of Systems  Constitutional:  Negative for chills and fever.  Respiratory:  Negative for shortness of breath.   Cardiovascular:  Negative  for chest pain.  Gastrointestinal:  Positive for abdominal pain and diarrhea. Negative for nausea and vomiting.  Genitourinary:  Negative for dysuria.  Skin:  Negative for rash.    Physical Exam BP 137/82   Pulse (!) 123   Temp 98.4 F (36.9 C) (Oral)   Ht 5\' 6"  (1.676 m)   Wt 240 lb 9.6 oz (109.1 kg)   SpO2 97%   BMI 38.83 kg/m  CONSTITUTIONAL: No acute distress HEENT:  Normocephalic, atraumatic, extraocular motion intact. RESPIRATORY:  Lungs are clear, and breath sounds are equal bilaterally. Normal respiratory effort without pathologic use of accessory muscles. CARDIOVASCULAR: Heart is  regular without murmurs, gallops, or rubs. GI: The abdomen is soft, nondistended, with some discomfort in the epigastric and right upper quadrant areas.  Negative Murphy's sign.  MUSCULOSKELETAL:  Normal muscle strength and tone in all four extremities.  No peripheral edema or cyanosis. NEUROLOGIC:  Motor and sensation is grossly normal.  Cranial nerves are grossly intact. PSYCH:  Alert and oriented to person, place and time. Affect is normal.  Laboratory Analysis: Labs from 11/10/2022: Sodium 135, potassium 3.8, chloride 102, CO2 25, BUN 13, creatinine 0.62.  WBC 9.8, hemoglobin 14.7, hematocrit 42.6, platelets 351.  Imaging: Ultrasound abdomen on 12/19/2022: IMPRESSION: 1. 6.1 mm polyp versus tumefactive sludge in the gallbladder. Recommend follow-up ultrasound in 12 months to ensure stability. 2. No other abnormalities.  Assessment and Plan: This is a 52 y.o. female with biliary sludge  -Discussed with the patient the findings on her ultrasound and labwork.  Overall her GI studies have been negative.  Her ultrasound is showing biliary sludge vs a possible polyp.  The patient reports symptoms of RUQ abdominal pain and diarrhea.  Discussed with her that this could be in relationship to her gallbladder.  Discussed how sludge could cause biliary colic episodes which would be causing her pain.  These could also possibly cause diarrhea if her digestion is affected.  This has improved when she makes dietary changes, but indiscretions will give her the symptoms again. --Discussed with the patient how her gallbladder is creating the biliary sludge which can be giving her symptoms, and the recommendation for treatment is typically in the form on cholecystectomy.  The patient would rather avoid surgery if at all possible.  She feels that her symptoms have improved with dietary changes.  Discussed with the patient that we can certainly try conservative measures.  Would recommend continuing with dietary  changes and avoiding greasy/fatty foods.  Will provide her with information on gallbladder diets.  She's also aiming at weight loss to help.  She's also interested in medication management.  Discussed with her the option for ursodiol, which can help with dissolution of gallstones/sludge.  Discussed with her that this medication could hep with her symptoms as well, but may take a while to work, and may not be fully successful.     --For now, will continue with conservative management options.  Will give her a 3 month prescription for ursodiol.  She will follow up with me in 3 months to check on her progress and see how her symptoms are doing.  If there is no improvement or if there is any worsening, then we would proceed with surgery.  She also understands that if her symptoms are worsening before our appointment, she should let us know sooner so we can see her. --Patient understands this plan and all of her questions have been answered.  I spent 30 minutes dedicated to the  care of this patient on the date of this encounter to include pre-visit review of records, face-to-face time with the patient discussing diagnosis and management, and any post-visit coordination of care.   Howie Ill, MD [facility]

## 2023-01-06 ENCOUNTER — Encounter: Payer: Self-pay | Admitting: Surgery

## 2023-03-28 DIAGNOSIS — M25562 Pain in left knee: Secondary | ICD-10-CM | POA: Diagnosis not present

## 2023-04-10 ENCOUNTER — Ambulatory Visit: Payer: BC Managed Care – PPO | Admitting: Surgery

## 2023-04-19 ENCOUNTER — Ambulatory Visit: Payer: BC Managed Care – PPO | Admitting: Surgery

## 2023-05-09 ENCOUNTER — Emergency Department
Admission: EM | Admit: 2023-05-09 | Discharge: 2023-05-09 | Disposition: A | Discharge status: Home / Self Care | Payer: BC Managed Care – PPO | Attending: Emergency Medicine | Admitting: Emergency Medicine

## 2023-05-09 ENCOUNTER — Emergency Department: Payer: BC Managed Care – PPO

## 2023-05-09 ENCOUNTER — Other Ambulatory Visit: Payer: Self-pay

## 2023-05-09 DIAGNOSIS — E860 Dehydration: Secondary | ICD-10-CM | POA: Insufficient documentation

## 2023-05-09 DIAGNOSIS — I10 Essential (primary) hypertension: Secondary | ICD-10-CM | POA: Insufficient documentation

## 2023-05-09 DIAGNOSIS — R55 Syncope and collapse: Secondary | ICD-10-CM | POA: Diagnosis not present

## 2023-05-09 DIAGNOSIS — R29818 Other symptoms and signs involving the nervous system: Secondary | ICD-10-CM | POA: Diagnosis not present

## 2023-05-09 DIAGNOSIS — I951 Orthostatic hypotension: Secondary | ICD-10-CM | POA: Diagnosis not present

## 2023-05-09 DIAGNOSIS — R42 Dizziness and giddiness: Secondary | ICD-10-CM

## 2023-05-09 LAB — BASIC METABOLIC PANEL
Anion gap: 9 (ref 5–15)
BUN: 13 mg/dL (ref 6–20)
CO2: 24 mmol/L (ref 22–32)
Calcium: 9 mg/dL (ref 8.9–10.3)
Chloride: 103 mmol/L (ref 98–111)
Creatinine, Ser: 0.6 mg/dL (ref 0.44–1.00)
GFR, Estimated: >60 mL/min (ref >60)
Glucose, Bld: 109 mg/dL — ABNORMAL HIGH (ref 70–99)
Potassium: 4.3 mmol/L (ref 3.5–5.1)
Sodium: 136 mmol/L (ref 135–145)

## 2023-05-09 LAB — CBC
HCT: 46.4 % — ABNORMAL HIGH (ref 36.0–46.0)
Hemoglobin: 15.6 g/dL — ABNORMAL HIGH (ref 12.0–15.0)
MCH: 31 pg (ref 26.0–34.0)
MCHC: 33.6 g/dL (ref 30.0–36.0)
MCV: 92.1 fL (ref 80.0–100.0)
Platelets: 288 10*3/uL (ref 150–400)
RBC: 5.04 MIL/uL (ref 3.87–5.11)
RDW: 12.1 % (ref 11.5–15.5)
WBC: 8.5 10*3/uL (ref 4.0–10.5)
nRBC: 0 % (ref 0.0–0.2)

## 2023-05-09 NOTE — ED Triage Notes (Signed)
Pt to ED for syncopal episode last week while bending over to put shoes on. States yesterday had some right arm numbness that has now subsided. Denies weakness, tingling, numbness at this time.

## 2023-05-09 NOTE — ED Notes (Signed)
Pt to flex with recent syncope and sensory changes. Pt is A&O x 4 at this time, respiration unlabored, even.

## 2023-05-09 NOTE — ED Triage Notes (Signed)
First Nurse Note;  Pt via POV from [facility]. Pt c/o R arm numbness, and "dropping more stuff than normal" since yesterday. Pt also reports syncopal episode. VSS per KC.

## 2023-05-09 NOTE — Discharge Instructions (Signed)
Please hold your hydrochlorothiazide (Hydrodiuril) until follow-up with your PCP

## 2023-05-09 NOTE — ED Provider Notes (Signed)
[facility] Provider Note   Event Date/Time   First MD Initiated Contact with Patient 05/09/23 1041     (approximate) History  Near Syncope  HPI Amy Watson is a 52 y.o. female with stated past medical history of hypertension who presents complaining of orthostatic lightheadedness.  Patient states that she has had lightheadedness especially with bending over for the last week.  Patient denies any new medications but states she has been taking her hydrochlorothiazide on time and as prescribed until today.  Patient states that she was advised to rest due to her knee injury so she is not as active as she normally is but denies new pain medicines for the knee or joints.  Patient endorses drinking "decent water".  Patient has recent past medical history of foot surgery as well as torn meniscus in the left knee. ROS: Patient currently denies any vision changes, tinnitus, difficulty speaking, facial droop, sore throat, chest pain, shortness of breath, abdominal pain, nausea/vomiting/diarrhea, dysuria, or weakness/numbness/paresthesias in any extremity   Physical Exam  Triage Vital Signs: ED Triage Vitals  Encounter Vitals Group     BP 05/09/23 0921 (!) 129/94     Systolic BP Percentile --      Diastolic BP Percentile --      Pulse Rate 05/09/23 0919 93     Resp 05/09/23 0919 20     Temp 05/09/23 0921 98.2 F (36.8 C)     Temp src --      SpO2 05/09/23 0919 98 %     Weight 05/09/23 0920 250 lb (113.4 kg)     Height 05/09/23 0920 5\' 6"  (1.676 m)     Head Circumference --      Peak Flow --      Pain Score 05/09/23 0920 0     Pain Loc --      Pain Education --      Exclude from Growth Chart --    Most recent vital signs: Vitals:   05/09/23 0919 05/09/23 0921  BP:  (!) 129/94  Pulse: 93   Resp: 20   Temp:  98.2 F (36.8 C)  SpO2: 98%    General: Awake, oriented x4. CV:  Good peripheral perfusion.  Resp:  Normal effort.  Abd:  No distention.   Other:  Middle-aged overweight Caucasian female resting comfortably in no acute distress ED Results / Procedures / Treatments  Labs (all labs ordered are listed, but only abnormal results are displayed) Labs Reviewed  CBC - Abnormal; Notable for the following components:      Result Value   Hemoglobin 15.6 (*)    HCT 46.4 (*)    All other components within normal limits  BASIC METABOLIC PANEL - Abnormal; Notable for the following components:   Glucose, Bld 109 (*)    All other components within normal limits   EKG ED ECG REPORT I, Merwyn Katos, the attending physician, personally viewed and interpreted this ECG. Date: 05/09/2023 EKG Time: 0929 Rate: 80 Rhythm: normal sinus rhythm QRS Axis: normal Intervals: normal ST/T Wave abnormalities: normal Narrative Interpretation: no evidence of acute ischemia RADIOLOGY ED MD interpretation: CT of the head without contrast interpreted by me shows no evidence of acute abnormalities including no intracerebral hemorrhage, obvious masses, or significant edema -Agree with radiology assessment Official radiology report(s): CT Head Wo Contrast  Result Date: 05/09/2023 CLINICAL DATA:  Neuro deficit, acute, stroke suspected EXAM: CT HEAD WITHOUT CONTRAST TECHNIQUE: Contiguous axial images were obtained  from the base of the skull through the vertex without intravenous contrast. RADIATION DOSE REDUCTION: This exam was performed according to the departmental dose-optimization program which includes automated exposure control, adjustment of the mA and/or kV according to patient size and/or use of iterative reconstruction technique. COMPARISON:  None Available. FINDINGS: Brain: No evidence of acute infarction, hemorrhage, hydrocephalus, extra-axial collection or mass lesion/mass effect. Vascular: No hyperdense vessel or unexpected calcification. Skull: Normal. Negative for fracture or focal lesion. Sinuses/Orbits: No acute finding. Other: None.  IMPRESSION: No evidence of acute intracranial abnormality. Electronically Signed   By: Feliberto Harts M.D.   On: 05/09/2023 10:40   PROCEDURES: Critical Care performed: No .1-3 Lead EKG Interpretation  Performed by: Merwyn Katos, MD Authorized by: Merwyn Katos, MD     Interpretation: normal     ECG rate:  71   ECG rate assessment: normal     Rhythm: sinus rhythm     Ectopy: none     Conduction: normal    MEDICATIONS ORDERED IN ED: Medications - No data to display IMPRESSION / MDM / ASSESSMENT AND PLAN / ED COURSE  I reviewed the triage vital signs and the nursing notes.                             The patient is on the cardiac monitor to evaluate for evidence of arrhythmia and/or significant heart rate changes. Patient's presentation is most consistent with acute presentation with potential threat to life or bodily function. Patient presents with complaints of syncope/presyncope ED Workup:  CBC, BMP, Troponin, ECG, head CT Differential diagnosis includes HF, ICH, seizure, stroke, HOCM, ACS, aortic dissection, malignant arrhythmia, or GI bleed. Findings: No evidence of acute laboratory abnormalities.  Troponin negative x1 EKG: No e/o STEMI. No evidence of Brugada's sign, delta wave, epsilon wave, significantly prolonged QTc, or malignant arrhythmia.  Disposition: Discharge. Patient is at baseline at this time. Return precautions expressed and understood in person. Advised follow up with primary care provider or clinic physician in next 24 hours.   FINAL CLINICAL IMPRESSION(S) / ED DIAGNOSES   Final diagnoses:  Orthostatic lightheadedness  Dehydration   Rx / DC Orders   ED Discharge Orders     None      Note:  This document was prepared using Dragon voice recognition software and may include unintentional dictation errors.   Merwyn Katos, MD 05/09/23 [phone]

## 2023-05-15 ENCOUNTER — Encounter: Payer: Self-pay | Admitting: Family

## 2023-05-15 ENCOUNTER — Ambulatory Visit: Payer: BC Managed Care – PPO | Admitting: Family

## 2023-05-15 VITALS — BP 134/84 | HR 96 | Temp 98.1°F | Ht 66.0 in | Wt 251.2 lb

## 2023-05-15 DIAGNOSIS — Z87898 Personal history of other specified conditions: Secondary | ICD-10-CM | POA: Diagnosis not present

## 2023-05-15 DIAGNOSIS — H65193 Other acute nonsuppurative otitis media, bilateral: Secondary | ICD-10-CM | POA: Diagnosis not present

## 2023-05-15 DIAGNOSIS — I10 Essential (primary) hypertension: Secondary | ICD-10-CM | POA: Diagnosis not present

## 2023-05-15 DIAGNOSIS — J301 Allergic rhinitis due to pollen: Secondary | ICD-10-CM | POA: Diagnosis not present

## 2023-05-15 MED ORDER — PREDNISONE 10 MG (21) PO TBPK
ORAL_TABLET | ORAL | 0 refills | Status: DC
Start: 2023-05-15 — End: 2023-09-14

## 2023-05-15 MED ORDER — VALSARTAN 160 MG PO TABS: 160.0000 mg | ORAL_TABLET | Freq: Every day | ORAL | 3 refills | Status: DC

## 2023-05-15 NOTE — Assessment & Plan Note (Signed)
Still not at goal <130/90  Stop lisinopril 40 mg once daily Start valsartan 160 mg once daily  Pt advised of the following:  Continue medication as prescribed. Monitor blood pressure periodically and/or when you feel symptomatic. Goal is <130/90 on average. Ensure that you have rested for 30 minutes prior to checking your blood pressure. Record your readings and bring them to your next visit if necessary.work on a low sodium diet.

## 2023-05-15 NOTE — Patient Instructions (Signed)
  Stop lisinopril start valsartan 160 mg once daily.  Monitor blood pressure daily.

## 2023-05-15 NOTE — Progress Notes (Signed)
Established Patient Office Visit  Subjective:      CC:  Chief Complaint  Patient presents with   Follow-up    ER follow up - Amy Watson    HPI: Amy Watson is a 52 y.o. female presenting on 05/15/2023 for Follow-up (ER follow up - Amy Watson) . Went to ER at [facility] 05/09/23 for  c/o orthostatic lightheadedness. Worse with bending over. Cbc normal limits. EKG in ER NSR rate 80 ,   CT head wo contrast, no acute findings. Troponin negative x 1  Per note 9/24 prior to being sent to Ed she had reported 9/19 that she passed out following right arm numbness. She doesn't remember falling, only remember waking up on the ground.   New complaints: Pt states that she did not have nasal congestion and or sinus pressure. She still takes today not feeling 100%. She has brought a blood pressure log with her today, Thursday highest 137/61, Friday highest 139/82, Sunday highest 140/78, and Monday highest 158/83. Pulse ranges from 88 to 124 (Sunday 2:45 pm. She states yesterday was outside in the garden and thinks it was due to menopause with hot flash, however she felt beat red and sweaty all of a sudden. They stopped her hydrochlorothiazide in the ER.   Today she still reports her 'head doesn't feel right' it feels fuzzy. Not as much dizziness as one week prior. She denies her heart racing and or chest pain. She does note SOB but this is 'normal for her' due to history of asthma. She has gained weight which isn't helping with her sob per her report.   She still reports increased fatigue. She does state drinking water and Gatorade regularly throughout the day.     Social history:  Relevant past medical, surgical, family and social history reviewed and updated as indicated. Interim medical history since our last visit reviewed.  Allergies and medications reviewed and updated.  DATA REVIEWED: CHART IN EPIC     ROS: Negative unless specifically indicated above in HPI.    Current  Outpatient Medications:    Calcium Carbonate+Vitamin D (CALCIUM 600+D3) 600-200 MG-UNIT TABS, Take 1 tablet by mouth in the morning and at bedtime., Disp: 180 tablet, Rfl: 3   cetirizine (ZYRTEC) 10 MG tablet, Take 10 mg by mouth as needed. , Disp: , Rfl:    Cholecalciferol (VITAMIN D3) 1.25 MG (50000 UT) CAPS, TAKE 1 TABLET BY MOUTH ONCE A WEEK., Disp: 4 capsule, Rfl: 0   escitalopram (LEXAPRO) 20 MG tablet, TAKE 1 TABLET BY MOUTH EVERY DAY, Disp: 90 tablet, Rfl: 3   fluticasone furoate-vilanterol (BREO ELLIPTA) 200-25 MCG/ACT AEPB, 1 puff Inhalation Once a day for 30 days, Disp: , Rfl:    montelukast (SINGULAIR) 10 MG tablet, Take by mouth as needed., Disp: , Rfl:    Norethindrone Acetate-Ethinyl Estrad-FE ([name] 24 FE) 1-20 MG-MCG(24) tablet, Take 1 tablet by mouth daily., Disp: 28 tablet, Rfl: 11   predniSONE (STERAPRED UNI-PAK 21 TAB) 10 MG (21) TBPK tablet, Take as directed, Disp: 1 each, Rfl: 0   PROAIR HFA 108 (90 Base) MCG/ACT inhaler, TAKE 1 2 PUFFS AS NEEDED EVERY 4 6 HOURS AS NEEDED FOR COUGH/WHEEZE, Disp: , Rfl:    ursodiol (ACTIGALL) 300 MG capsule, Take 1 capsule (300 mg total) by mouth 3 (three) times daily., Disp: 90 capsule, Rfl: 2   valsartan (DIOVAN) 160 MG tablet, Take 1 tablet (160 mg total) by mouth daily., Disp: 90 tablet, Rfl: 3  Objective:    BP 134/84   Pulse 96   Temp 98.1 F (36.7 C) (Temporal)   Ht 5\' 6"  (1.676 m)   Wt 251 lb 3.2 oz (113.9 kg)   SpO2 97%   BMI 40.54 kg/m   Wt Readings from Last 3 Encounters:  05/15/23 251 lb 3.2 oz (113.9 kg)  05/09/23 250 lb (113.4 kg)  01/04/23 240 lb 9.6 oz (109.1 kg)    Physical Exam Constitutional:      General: She is not in acute distress.    Appearance: Normal appearance. She is normal weight. She is not ill-appearing, toxic-appearing or diaphoretic.  HENT:     Head: Normocephalic.     Right Ear: A middle ear effusion is present.     Left Ear: A middle ear effusion is present.     Nose:     Right  Sinus: No maxillary sinus tenderness or frontal sinus tenderness.     Left Sinus: No maxillary sinus tenderness or frontal sinus tenderness.     Mouth/Throat:     Pharynx: Posterior oropharyngeal erythema and postnasal drip present.  Cardiovascular:     Rate and Rhythm: Normal rate and regular rhythm.  Pulmonary:     Effort: Pulmonary effort is normal.     Breath sounds: Normal breath sounds.  Musculoskeletal:        General: Normal range of motion.  Neurological:     General: No focal deficit present.     Mental Status: She is alert and oriented to person, place, and time. Mental status is at baseline.  Psychiatric:        Mood and Affect: Mood normal.        Behavior: Behavior normal.        Thought Content: Thought content normal.        Judgment: Judgment normal.    Blood pressure 134/84, pulse 96, temperature 98.1 F (36.7 C), temperature source Temporal, height 5\' 6"  (1.676 m), weight 251 lb 3.2 oz (113.9 kg), SpO2 97%.     Laying: 96%, 91 pulse, 138/22 Sitting: 95%, 92 pulse, 142/92 Standing: 96%, 90 pulse, 134/86    Assessment & Plan:  Acute effusion of both middle ears Assessment & Plan: Suspect may be contributing to dizziness.  Rx prednisone pack  Recommendation to start zyrtec nightly   Orders: -     predniSONE; Take as directed  Dispense: 1 each; Refill: 0  Non-seasonal allergic rhinitis due to pollen -     predniSONE; Take as directed  Dispense: 1 each; Refill: 0  Primary hypertension Assessment & Plan: Still not at goal <130/90  Stop lisinopril 40 mg once daily Start valsartan 160 mg once daily  Pt advised of the following:  Continue medication as prescribed. Monitor blood pressure periodically and/or when you feel symptomatic. Goal is <130/90 on average. Ensure that you have rested for 30 minutes prior to checking your blood pressure. Record your readings and bring them to your next visit if necessary.work on a low sodium diet.     Orders: -      Valsartan; Take 1 tablet (160 mg total) by mouth daily.  Dispense: 90 tablet; Refill: 3  History of syncope Assessment & Plan: Workup reassuring.  Low suspicion for hypotension, orthostatic vitals in office unremarkable.  Hydrochlorothiazide was d/c in ER however elevated BP in ER so unsure reason?  Blood pressure log not within goal that pt brought in so will change lisinopril to valsartan.  Maintain water intake daily  of 90 oz goal, eat every 2-3 hours.  Cbc cmp normal limits.  CT head no acute findings and reviewed.  If reoccurs or dizziness not resolved with prednisone for bil ear effusion, will order echo.       Return in about 2 weeks (around 05/29/2023) for f/u blood pressure.  Mort Sawyers, MSN, APRN, FNP-C [facility]

## 2023-05-15 NOTE — Assessment & Plan Note (Signed)
Suspect may be contributing to dizziness.  Rx prednisone pack  Recommendation to start zyrtec nightly

## 2023-05-15 NOTE — Assessment & Plan Note (Addendum)
Workup reassuring.  Low suspicion for hypotension, orthostatic vitals in office unremarkable.  Hydrochlorothiazide was d/c in ER however elevated BP in ER so unsure reason?  Blood pressure log not within goal that pt brought in so will change lisinopril to valsartan.  Maintain water intake daily of 90 oz goal, eat every 2-3 hours.  Cbc cmp normal limits.  CT head no acute findings and reviewed.  If reoccurs or dizziness not resolved with prednisone for bil ear effusion, will order echo.

## 2023-05-23 ENCOUNTER — Other Ambulatory Visit: Payer: Self-pay | Admitting: Family

## 2023-05-23 DIAGNOSIS — Z30011 Encounter for initial prescription of contraceptive pills: Secondary | ICD-10-CM

## 2023-05-31 ENCOUNTER — Other Ambulatory Visit: Payer: Self-pay | Admitting: Family

## 2023-05-31 DIAGNOSIS — F4323 Adjustment disorder with mixed anxiety and depressed mood: Secondary | ICD-10-CM

## 2023-06-14 ENCOUNTER — Telehealth: Payer: Self-pay

## 2023-06-14 NOTE — Telephone Encounter (Signed)
Transition Care Management Unsuccessful Follow-up Telephone Call  Date of discharge and from where:  [place] 9/24  Attempts:  2nd Attempt  Reason for unsuccessful TCM follow-up call:  No answer/busy   Lenard Forth [place]  [facility], [facility] Guide, Phone: [phone] Website: [email or web address]

## 2023-06-14 NOTE — Telephone Encounter (Signed)
Transition Care Management Unsuccessful Follow-up Telephone Call  Date of discharge and from where:  [place] 9/24  Attempts:  1st Attempt  Reason for unsuccessful TCM follow-up call:  No answer/busy   Lenard Forth [place]  [facility], [facility] Guide, Phone: [phone] Website: [email or web address]

## 2023-08-10 DIAGNOSIS — M25562 Pain in left knee: Secondary | ICD-10-CM | POA: Diagnosis not present

## 2023-08-11 DIAGNOSIS — S83249A Other tear of medial meniscus, current injury, unspecified knee, initial encounter: Secondary | ICD-10-CM | POA: Insufficient documentation

## 2023-08-16 DIAGNOSIS — G4730 Sleep apnea, unspecified: Secondary | ICD-10-CM

## 2023-08-16 DIAGNOSIS — K828 Other specified diseases of gallbladder: Secondary | ICD-10-CM

## 2023-08-16 HISTORY — DX: Other specified diseases of gallbladder: K82.8

## 2023-08-16 HISTORY — DX: Sleep apnea, unspecified: G47.30

## 2023-08-16 HISTORY — PX: OTHER SURGICAL HISTORY: SHX169

## 2023-08-21 ENCOUNTER — Encounter: Payer: Self-pay | Admitting: Family

## 2023-08-21 DIAGNOSIS — I10 Essential (primary) hypertension: Secondary | ICD-10-CM

## 2023-08-22 DIAGNOSIS — S83242D Other tear of medial meniscus, current injury, left knee, subsequent encounter: Secondary | ICD-10-CM | POA: Diagnosis not present

## 2023-08-22 MED ORDER — VALSARTAN 320 MG PO TABS
320.0000 mg | ORAL_TABLET | Freq: Every day | ORAL | 0 refills | Status: DC
Start: 2023-08-22 — End: 2023-11-30

## 2023-08-22 NOTE — Telephone Encounter (Signed)
 Please let her know I am increasing to 320 mg valsartan daily. Keep blood pressure log and have her schedule in office f/u in 2-3 weeks

## 2023-08-27 ENCOUNTER — Other Ambulatory Visit: Payer: Self-pay | Admitting: Family

## 2023-08-27 DIAGNOSIS — F4323 Adjustment disorder with mixed anxiety and depressed mood: Secondary | ICD-10-CM

## 2023-09-06 ENCOUNTER — Other Ambulatory Visit: Payer: Self-pay | Admitting: Medical Genetics

## 2023-09-06 DIAGNOSIS — Y999 Unspecified external cause status: Secondary | ICD-10-CM | POA: Diagnosis not present

## 2023-09-06 DIAGNOSIS — G8918 Other acute postprocedural pain: Secondary | ICD-10-CM | POA: Diagnosis not present

## 2023-09-06 DIAGNOSIS — X58XXXA Exposure to other specified factors, initial encounter: Secondary | ICD-10-CM | POA: Diagnosis not present

## 2023-09-06 DIAGNOSIS — S83242A Other tear of medial meniscus, current injury, left knee, initial encounter: Secondary | ICD-10-CM | POA: Diagnosis not present

## 2023-09-06 DIAGNOSIS — M794 Hypertrophy of (infrapatellar) fat pad: Secondary | ICD-10-CM | POA: Diagnosis not present

## 2023-09-06 DIAGNOSIS — S83232A Complex tear of medial meniscus, current injury, left knee, initial encounter: Secondary | ICD-10-CM | POA: Diagnosis not present

## 2023-09-06 DIAGNOSIS — M65962 Unspecified synovitis and tenosynovitis, left lower leg: Secondary | ICD-10-CM | POA: Diagnosis not present

## 2023-09-06 DIAGNOSIS — M94262 Chondromalacia, left knee: Secondary | ICD-10-CM | POA: Diagnosis not present

## 2023-09-06 DIAGNOSIS — M6752 Plica syndrome, left knee: Secondary | ICD-10-CM | POA: Diagnosis not present

## 2023-09-12 ENCOUNTER — Ambulatory Visit: Payer: BC Managed Care – PPO | Admitting: Family

## 2023-09-13 ENCOUNTER — Encounter: Payer: Self-pay | Admitting: Family

## 2023-09-14 ENCOUNTER — Ambulatory Visit: Payer: BC Managed Care – PPO | Admitting: Family

## 2023-09-14 ENCOUNTER — Encounter: Payer: Self-pay | Admitting: Family

## 2023-09-14 VITALS — BP 148/89 | HR 91 | Temp 98.6°F | Ht 66.0 in | Wt 260.8 lb

## 2023-09-14 DIAGNOSIS — F4323 Adjustment disorder with mixed anxiety and depressed mood: Secondary | ICD-10-CM

## 2023-09-14 DIAGNOSIS — E7800 Pure hypercholesterolemia, unspecified: Secondary | ICD-10-CM | POA: Diagnosis not present

## 2023-09-14 DIAGNOSIS — D582 Other hemoglobinopathies: Secondary | ICD-10-CM | POA: Diagnosis not present

## 2023-09-14 DIAGNOSIS — I10 Essential (primary) hypertension: Secondary | ICD-10-CM | POA: Diagnosis not present

## 2023-09-14 DIAGNOSIS — R739 Hyperglycemia, unspecified: Secondary | ICD-10-CM

## 2023-09-14 LAB — LIPID PANEL
Cholesterol: 156 mg/dL (ref 0–200)
HDL: 42.8 mg/dL (ref >39.00)
LDL Cholesterol: 86 mg/dL (ref 0–99)
NonHDL: 113.67
Total CHOL/HDL Ratio: 4
Triglycerides: 140 mg/dL (ref 0.0–149.0)
VLDL: 28 mg/dL (ref 0.0–40.0)

## 2023-09-14 LAB — TSH: TSH: 1.13 u[IU]/mL (ref 0.35–5.50)

## 2023-09-14 LAB — CBC
HCT: 42 % (ref 36.0–46.0)
Hemoglobin: 14.1 g/dL (ref 12.0–15.0)
MCHC: 33.6 g/dL (ref 30.0–36.0)
MCV: 92.5 fL (ref 78.0–100.0)
Platelets: 271 10*3/uL (ref 150.0–400.0)
RBC: 4.54 Mil/uL (ref 3.87–5.11)
RDW: 12.9 % (ref 11.5–15.5)
WBC: 7.4 10*3/uL (ref 4.0–10.5)

## 2023-09-14 LAB — MICROALBUMIN / CREATININE URINE RATIO
Creatinine,U: 120.9 mg/dL
Microalb Creat Ratio: 0.6 mg/g (ref 0.0–30.0)
Microalb, Ur: <0.7 mg/dL (ref 0.0–1.9)

## 2023-09-14 LAB — VITAMIN B12: Vitamin B-12: 296 pg/mL (ref 211–911)

## 2023-09-14 LAB — HEMOGLOBIN A1C: Hgb A1c MFr Bld: 5.8 % (ref 4.6–6.5)

## 2023-09-14 MED ORDER — AMLODIPINE BESYLATE 5 MG PO TABS
5.0000 mg | ORAL_TABLET | Freq: Every day | ORAL | 0 refills | Status: DC
Start: 2023-09-14 — End: 2023-09-28

## 2023-09-14 NOTE — Progress Notes (Signed)
Established Patient Office Visit  Subjective:   Patient ID: Amy Watson, female    DOB: 07/01/1971  Age: 53 y.o. MRN: [id number]  CC:  Chief Complaint  Patient presents with   Medical Management of Chronic Issues    HPI: Amy Watson is a 53 y.o. female presenting on 09/14/2023 for Medical Management of Chronic Issues  HTN: average at home 145/160 over 85. She states has been like this for the last few weeks. She denies cp palp and or sob. She uses her work blood pressure cuff as well as at home and does sit for at least 5-10 minutes prior to checking. She is currently on vasartan 320 mg   Left knee surgery, surgery was last week. She is doing ok but as expected still in pain. Orthopedist was Dr. Aundria Rud that did the surgery.   Trying to focus on diet and limiting her fried fatty food intake.       ROS: Negative unless specifically indicated above in HPI.   Relevant past medical history reviewed and updated as indicated.   Allergies and medications reviewed and updated.   Current Outpatient Medications:    amLODipine (NORVASC) 5 MG tablet, Take 1 tablet (5 mg total) by mouth daily., Disp: 90 tablet, Rfl: 0   Calcium Carbonate+Vitamin D (CALCIUM 600+D3) 600-200 MG-UNIT TABS, Take 1 tablet by mouth in the morning and at bedtime., Disp: 180 tablet, Rfl: 3   cetirizine (ZYRTEC) 10 MG tablet, Take 10 mg by mouth as needed. , Disp: , Rfl:    Cholecalciferol (VITAMIN D3) 1.25 MG (50000 UT) CAPS, TAKE 1 TABLET BY MOUTH ONCE A WEEK., Disp: 4 capsule, Rfl: 0   escitalopram (LEXAPRO) 20 MG tablet, TAKE 1 TABLET BY MOUTH EVERY DAY, Disp: 90 tablet, Rfl: 0   fluticasone furoate-vilanterol (BREO ELLIPTA) 200-25 MCG/ACT AEPB, 1 puff Inhalation Once a day for 30 days, Disp: , Rfl:    [name] 24 FE 1-20 MG-MCG(24) tablet, TAKE 1 TABLET BY MOUTH EVERY DAY, Disp: 84 tablet, Rfl: 3   montelukast (SINGULAIR) 10 MG tablet, Take by mouth as needed., Disp: , Rfl:    PROAIR HFA 108 (90 Base)  MCG/ACT inhaler, TAKE 1 2 PUFFS AS NEEDED EVERY 4 6 HOURS AS NEEDED FOR COUGH/WHEEZE, Disp: , Rfl:    valsartan (DIOVAN) 320 MG tablet, Take 1 tablet (320 mg total) by mouth daily., Disp: 90 tablet, Rfl: 0  Allergies  Allergen Reactions   Prozac [Fluoxetine] Other (See Comments)    fatigue   Azithromycin Other (See Comments)    C. Diff./  Pain     Objective:   BP (!) 148/89   Pulse 91   Temp 98.6 F (37 C) (Temporal)   Ht 5\' 6"  (1.676 m)   Wt 260 lb 12.8 oz (118.3 kg)   SpO2 97%   BMI 42.09 kg/m    Physical Exam Constitutional:      General: She is not in acute distress.    Appearance: Normal appearance. She is obese. She is not ill-appearing, toxic-appearing or diaphoretic.  HENT:     Head: Normocephalic.  Cardiovascular:     Rate and Rhythm: Normal rate and regular rhythm.  Pulmonary:     Effort: Pulmonary effort is normal.     Breath sounds: Normal breath sounds.  Musculoskeletal:        General: Normal range of motion.  Neurological:     General: No focal deficit present.     Mental Status: She is alert  and oriented to person, place, and time. Mental status is at baseline.  Psychiatric:        Mood and Affect: Mood normal.        Behavior: Behavior normal.        Thought Content: Thought content normal.        Judgment: Judgment normal.     Wt Readings from Last 3 Encounters:  09/14/23 260 lb 12.8 oz (118.3 kg)  05/15/23 251 lb 3.2 oz (113.9 kg)  05/09/23 250 lb (113.4 kg)     Assessment & Plan:  Primary hypertension Assessment & Plan: Continue valsartan 160 mg once daily  Start amlodipine 5 mg once daily Pt advised of the following:  Continue medication as prescribed. Monitor blood pressure periodically and/or when you feel symptomatic. Goal is <130/90 on average. Ensure that you have rested for 30 minutes prior to checking your blood pressure. Record your readings and bring them to your next visit if necessary.work on a low sodium  diet.     Orders: -     TSH -     Microalbumin / creatinine urine ratio -     amLODIPine Besylate; Take 1 tablet (5 mg total) by mouth daily.  Dispense: 90 tablet; Refill: 0  Elevated hemoglobin (HCC) -     Vitamin B12 -     CBC  Morbid obesity (HCC) -     Hemoglobin A1c -     TSH  Elevated LDL cholesterol level Assessment & Plan: Ordered lipid panel, pending results. Work on low cholesterol diet and exercise as tolerated   Orders: -     Lipid panel  Hyperglycemia -     Hemoglobin A1c     Follow up plan: Return in about 3 months (around 12/13/2023) for f/u blood pressure.  Mort Sawyers, FNP

## 2023-09-14 NOTE — Assessment & Plan Note (Signed)
Ordered lipid panel, pending results. Work on low cholesterol diet and exercise as tolerated

## 2023-09-14 NOTE — Assessment & Plan Note (Signed)
Continue valsartan 160 mg once daily  Start amlodipine 5 mg once daily Pt advised of the following:  Continue medication as prescribed. Monitor blood pressure periodically and/or when you feel symptomatic. Goal is <130/90 on average. Ensure that you have rested for 30 minutes prior to checking your blood pressure. Record your readings and bring them to your next visit if necessary.work on a low sodium diet.

## 2023-09-28 MED ORDER — AMLODIPINE BESYLATE 10 MG PO TABS
10.0000 mg | ORAL_TABLET | Freq: Every day | ORAL | 3 refills | Status: AC
Start: 2023-09-28 — End: ?

## 2023-10-03 MED ORDER — BUSPIRONE HCL 5 MG PO TABS
5.0000 mg | ORAL_TABLET | Freq: Two times a day (BID) | ORAL | 2 refills | Status: DC
Start: 2023-10-03 — End: 2023-10-09

## 2023-10-03 NOTE — Addendum Note (Signed)
 Addended by: Mort Sawyers on: 10/03/2023 09:35 AM   Modules accepted: Orders

## 2023-10-05 ENCOUNTER — Encounter: Payer: Self-pay | Admitting: Family

## 2023-10-05 DIAGNOSIS — I10 Essential (primary) hypertension: Secondary | ICD-10-CM

## 2023-10-05 DIAGNOSIS — R7303 Prediabetes: Secondary | ICD-10-CM

## 2023-10-05 DIAGNOSIS — E7800 Pure hypercholesterolemia, unspecified: Secondary | ICD-10-CM

## 2023-10-09 ENCOUNTER — Encounter: Payer: Self-pay | Admitting: Family

## 2023-10-09 ENCOUNTER — Telehealth: Payer: Self-pay | Admitting: Family

## 2023-10-09 ENCOUNTER — Other Ambulatory Visit: Payer: Self-pay | Admitting: Family

## 2023-10-09 ENCOUNTER — Other Ambulatory Visit: Payer: Self-pay | Admitting: *Deleted

## 2023-10-09 ENCOUNTER — Ambulatory Visit: Payer: BC Managed Care – PPO | Admitting: Family

## 2023-10-09 VITALS — BP 138/92 | HR 93 | Temp 98.4°F | Ht 66.0 in | Wt 260.2 lb

## 2023-10-09 DIAGNOSIS — R7303 Prediabetes: Secondary | ICD-10-CM

## 2023-10-09 DIAGNOSIS — I10 Essential (primary) hypertension: Secondary | ICD-10-CM

## 2023-10-09 DIAGNOSIS — F4323 Adjustment disorder with mixed anxiety and depressed mood: Secondary | ICD-10-CM

## 2023-10-09 DIAGNOSIS — E7800 Pure hypercholesterolemia, unspecified: Secondary | ICD-10-CM

## 2023-10-09 DIAGNOSIS — Z6841 Body Mass Index (BMI) 40.0 and over, adult: Secondary | ICD-10-CM

## 2023-10-09 DIAGNOSIS — I159 Secondary hypertension, unspecified: Secondary | ICD-10-CM

## 2023-10-09 DIAGNOSIS — G4719 Other hypersomnia: Secondary | ICD-10-CM

## 2023-10-09 DIAGNOSIS — I1A0 Resistant hypertension: Secondary | ICD-10-CM

## 2023-10-09 MED ORDER — BUSPIRONE HCL 7.5 MG PO TABS
7.5000 mg | ORAL_TABLET | Freq: Two times a day (BID) | ORAL | 3 refills | Status: DC
Start: 2023-10-09 — End: 2023-10-16

## 2023-10-09 MED ORDER — WEGOVY 0.25 MG/0.5ML ~~LOC~~ SOAJ
SUBCUTANEOUS | 0 refills | Status: DC
Start: 2023-10-09 — End: 2023-10-10

## 2023-10-09 MED ORDER — WEGOVY 0.5 MG/0.5ML ~~LOC~~ SOAJ
SUBCUTANEOUS | 0 refills | Status: DC
Start: 2023-10-30 — End: 2023-10-10

## 2023-10-09 MED ORDER — CARVEDILOL 3.125 MG PO TABS: 3.1250 mg | ORAL_TABLET | Freq: Two times a day (BID) | ORAL | 3 refills | Status: DC

## 2023-10-09 MED ORDER — WEGOVY 1 MG/0.5ML ~~LOC~~ SOAJ
1.0000 mg | SUBCUTANEOUS | 0 refills | Status: DC
Start: 2023-11-20 — End: 2023-10-10

## 2023-10-09 NOTE — Patient Instructions (Addendum)
  I have sent in your order electronically for the following: ultrasound kidneys at this location below. Please call to schedule the appointment at your convenience  [facility] outpatient imaging center off [address], [place] [address] Phone [phone]-  8-5 pm    ------------------------------------  I have ordered a sleep study.    ------------------------------------  Increase buspirone to 7.5 mg twice daily.    ------------------------------------  Adding on carvedilol 3.125 mg twice daily

## 2023-10-09 NOTE — Telephone Encounter (Signed)
 Ordered renal stenosis u/s instead Can you please let imaging know? I was unable to cancel renal u/s originally bc its already scheduled if they could cancel that as well.

## 2023-10-09 NOTE — Telephone Encounter (Signed)
 Due to no response from [place] and that it is almost 5pm,  I went ahead and reached out to the patient and regarding her appt tomorrow morning to make her aware that her US Renal appt has been canceled - The patient did not answer.   I left a detailed message making her aware to contact the office in the morning to discuss further with Tabitha the reasoning for changing the order.

## 2023-10-09 NOTE — Progress Notes (Signed)
 Established Patient Office Visit  Subjective:      CC:  Chief Complaint  Patient presents with   Hypertension    HPI: Amy Watson is a 53 y.o. female presenting on 10/09/2023 for Hypertension  HTN: average at home 150/85. Denies cp palp and or sob.  Urine m/a negative. On amlodipine 10 mg and also the valsartan 320 mg once daily. She is checking her blood pressure after sitting for some time.   Prediabetes: trying to work on diet.  Exercise: trying to work with a Systems analyst, working with her and going to gym lifting weights a few times a week.  Anxiety: doing well on lexapro 20 mg once daily as well as buspirone 5 mg twice daily.   Breastkfast: oatmeal and blueberries, sprinkle chocolate chips and yogurt in the am. Frustrating because it is not improving with her weight, gaining weight vs losing weight despite interventions.     Social history:  Relevant past medical, surgical, family and social history reviewed and updated as indicated. Interim medical history since our last visit reviewed.  Allergies and medications reviewed and updated.  DATA REVIEWED: CHART IN EPIC     ROS: Negative unless specifically indicated above in HPI.    Current Outpatient Medications:    amLODipine (NORVASC) 10 MG tablet, Take 1 tablet (10 mg total) by mouth daily., Disp: 90 tablet, Rfl: 3   busPIRone (BUSPAR) 7.5 MG tablet, Take 1 tablet (7.5 mg total) by mouth 2 (two) times daily., Disp: 180 tablet, Rfl: 3   Calcium Carbonate+Vitamin D (CALCIUM 600+D3) 600-200 MG-UNIT TABS, Take 1 tablet by mouth in the morning and at bedtime., Disp: 180 tablet, Rfl: 3   carvedilol (COREG) 3.125 MG tablet, Take 1 tablet (3.125 mg total) by mouth 2 (two) times daily with a meal., Disp: 60 tablet, Rfl: 3   cetirizine (ZYRTEC) 10 MG tablet, Take 10 mg by mouth as needed. , Disp: , Rfl:    Cholecalciferol (VITAMIN D3) 1.25 MG (50000 UT) CAPS, TAKE 1 TABLET BY MOUTH ONCE A WEEK., Disp: 4  capsule, Rfl: 0   escitalopram (LEXAPRO) 20 MG tablet, TAKE 1 TABLET BY MOUTH EVERY DAY, Disp: 90 tablet, Rfl: 0   fluticasone furoate-vilanterol (BREO ELLIPTA) 200-25 MCG/ACT AEPB, 1 puff Inhalation Once a day for 30 days, Disp: , Rfl:    [name] 24 FE 1-20 MG-MCG(24) tablet, TAKE 1 TABLET BY MOUTH EVERY DAY, Disp: 84 tablet, Rfl: 3   montelukast (SINGULAIR) 10 MG tablet, Take by mouth as needed., Disp: , Rfl:    PROAIR HFA 108 (90 Base) MCG/ACT inhaler, TAKE 1 2 PUFFS AS NEEDED EVERY 4 6 HOURS AS NEEDED FOR COUGH/WHEEZE, Disp: , Rfl:    Semaglutide-Weight Management (WEGOVY) 0.25 MG/0.5ML SOAJ, Start 0.25 mg qweek for four weeks, then increase to 0.5 mg dose qweek, Disp: 2 mL, Rfl: 0   [START ON 10/30/2023] Semaglutide-Weight Management (WEGOVY) 0.5 MG/0.5ML SOAJ, Inject 0.5 mg [place] weekly, Disp: 2 mL, Rfl: 0   [START ON 11/20/2023] Semaglutide-Weight Management (WEGOVY) 1 MG/0.5ML SOAJ, Inject 1 mg into the skin once a week., Disp: 2 mL, Rfl: 0   valsartan (DIOVAN) 320 MG tablet, Take 1 tablet (320 mg total) by mouth daily., Disp: 90 tablet, Rfl: 0      Objective:    BP (!) 138/92 (BP Location: Left Arm, Patient Position: Sitting, Cuff Size: Large)   Pulse 93   Temp 98.4 F (36.9 C)   Ht 5\' 6"  (1.676 m)   Wt  260 lb 3.2 oz (118 kg)   SpO2 98%   BMI 42.00 kg/m   Wt Readings from Last 3 Encounters:  10/09/23 260 lb 3.2 oz (118 kg)  09/14/23 260 lb 12.8 oz (118.3 kg)  05/15/23 251 lb 3.2 oz (113.9 kg)    Physical Exam Constitutional:      General: She is not in acute distress.    Appearance: Normal appearance. She is obese. She is not ill-appearing, toxic-appearing or diaphoretic.  HENT:     Head: Normocephalic.  Cardiovascular:     Rate and Rhythm: Normal rate and regular rhythm.  Pulmonary:     Effort: Pulmonary effort is normal.     Breath sounds: Normal breath sounds.  Musculoskeletal:        General: Normal range of motion.  Neurological:     General: No focal deficit  present.     Mental Status: She is alert and oriented to person, place, and time. Mental status is at baseline.  Psychiatric:        Mood and Affect: Mood normal.        Behavior: Behavior normal.        Thought Content: Thought content normal.        Judgment: Judgment normal.           Assessment & Plan:  Elevated LDL cholesterol level Assessment & Plan: Ordered lipid panel, pending results. Work on low cholesterol diet and exercise as tolerated   Orders: -     VWUJWJ; Inject 0.5 mg [place] weekly  Dispense: 2 mL; Refill: 0 -     Wegovy; Start 0.25 mg qweek for four weeks, then increase to 0.5 mg dose qweek  Dispense: 2 mL; Refill: 0 -     XBJYNW; Inject 1 mg into the skin once a week.  Dispense: 2 mL; Refill: 0  Morbid obesity (HCC) Assessment & Plan: Pt advised to work on diet and exercise as tolerated   Orders: -     GNFAOZ; Inject 0.5 mg [place] weekly  Dispense: 2 mL; Refill: 0 -     Wegovy; Start 0.25 mg qweek for four weeks, then increase to 0.5 mg dose qweek  Dispense: 2 mL; Refill: 0 -     HYQMVH; Inject 1 mg into the skin once a week.  Dispense: 2 mL; Refill: 0 -     Ambulatory referral to Sleep Studies  Primary hypertension Assessment & Plan: Still too elevated.  Continue valsartan 160 mg once daily  Continue amlodipine 10 mg once daily Not candidate to hydrochlorothiazide, last time caused dehydration.  Add on carvedilol 3.125 twice daily Ordering renal u/s and sleep study to r/o osa and renal stenosis which might be contributing to secondary htn. Pt advised of the following:  Continue medication as prescribed. Monitor blood pressure periodically and/or when you feel symptomatic. Goal is <130/90 on average. Ensure that you have rested for 30 minutes prior to checking your blood pressure. Record your readings and bring them to your next visit if necessary.work on a low sodium diet.      Orders: -     Wegovy; Inject 0.5 mg [place] weekly  Dispense: 2 mL; Refill: 0 -      Wegovy; Start 0.25 mg qweek for four weeks, then increase to 0.5 mg dose qweek  Dispense: 2 mL; Refill: 0 -     QIONGE; Inject 1 mg into the skin once a week.  Dispense: 2 mL; Refill: 0 -  Carvedilol; Take 1 tablet (3.125 mg total) by mouth 2 (two) times daily with a meal.  Dispense: 60 tablet; Refill: 3  Prediabetes Assessment & Plan: The beneficiary does not have any FDA labeled contraindications to the requested agent including pregnancy, lactation, h/o medullary thyroid cancer or multiple endocrine neoplasia type II.   Trial wegovy  Pt advised to work on diet and exercise as tolerated   Orders: -     ZOXWRU; Inject 0.5 mg [place] weekly  Dispense: 2 mL; Refill: 0 -     Wegovy; Start 0.25 mg qweek for four weeks, then increase to 0.5 mg dose qweek  Dispense: 2 mL; Refill: 0 -     EAVWUJ; Inject 1 mg into the skin once a week.  Dispense: 2 mL; Refill: 0  Excessive daytime sleepiness -     Ambulatory referral to Sleep Studies  Resistant hypertension Assessment & Plan: Still too elevated.  Continue valsartan 160 mg once daily  Continue amlodipine 10 mg once daily Not candidate to hydrochlorothiazide, last time caused dehydration.  Add on carvedilol 3.125 twice daily Ordering renal u/s and sleep study to r/o osa and renal stenosis which might be contributing to secondary htn. Pt advised of the following:  Continue medication as prescribed. Monitor blood pressure periodically and/or when you feel symptomatic. Goal is <130/90 on average. Ensure that you have rested for 30 minutes prior to checking your blood pressure. Record your readings and bring them to your next visit if necessary.work on a low sodium diet.      Orders: -     PTH, intact and calcium; Future -     US RENAL; Future  Adjustment disorder with mixed anxiety and depressed mood Assessment & Plan: Continue lexapro 20 mg once daily  Increase buspirone to 7.5 mg twice daily Work on anxiety reducing  techniques  Orders: -     busPIRone HCl; Take 1 tablet (7.5 mg total) by mouth 2 (two) times daily.  Dispense: 180 tablet; Refill: 3     Return in about 3 months (around 01/06/2024) for f/u blood pressure.  Mort Sawyers, MSN, APRN, FNP-C [facility]

## 2023-10-09 NOTE — Assessment & Plan Note (Signed)
 Continue lexapro 20 mg once daily  Increase buspirone to 7.5 mg twice daily Work on anxiety reducing techniques

## 2023-10-09 NOTE — Telephone Encounter (Signed)
 Imaging has been made aware.

## 2023-10-09 NOTE — Assessment & Plan Note (Signed)
 The beneficiary does not have any FDA labeled contraindications to the requested agent including pregnancy, lactation, h/o medullary thyroid cancer or multiple endocrine neoplasia type II.   Trial wegovy  Pt advised to work on diet and exercise as tolerated

## 2023-10-09 NOTE — Assessment & Plan Note (Signed)
 Pt advised to work on diet and exercise as tolerated

## 2023-10-09 NOTE — Assessment & Plan Note (Addendum)
 Still too elevated.  Continue valsartan 160 mg once daily  Continue amlodipine 10 mg once daily Not candidate to hydrochlorothiazide, last time caused dehydration.  Add on carvedilol 3.125 twice daily Ordering renal u/s and sleep study to r/o osa and renal stenosis which might be contributing to secondary htn. Pt advised of the following:  Continue medication as prescribed. Monitor blood pressure periodically and/or when you feel symptomatic. Goal is <130/90 on average. Ensure that you have rested for 30 minutes prior to checking your blood pressure. Record your readings and bring them to your next visit if necessary.work on a low sodium diet.

## 2023-10-09 NOTE — Telephone Encounter (Signed)
 I called and spoke with [facility] regarding this patients appt and [place] order.   There seemed to be some communication break down per Amy Watson with [facility] [place] dept. She stated that she called [place] and discussed thoroughly with someone in the office (cannot recall their name) regarding the [place] order that was placed which the patient is currently scheduled for tomorrow 10/10/23 at 8am.   Pt is currently scheduled for an US Renal study and this is the incorrect order for what Amy Watson was looking for or reason for exam. The [place] tech Amy Watson) called and discussed this with someone in the office and states that she explained everything as to why the order would need to be changed from an US Renal to an US Renal Stenosis Study.   She requested that we call the patient and explain the reasoning behind needing to change the order and also the patient needs to call and cancel/schedule the [place] appts.   Amy Watson, are you able to contact the patient and discuss this clinical information?   Once the patient is made aware she can call and cancel her US Renal appt for tomorrow 10/10/23 and schedule the new appt on another day. The [place] dept is unable to get her in tomorrow for the new test due to the length of time it takes to do the scan, so the patient will have to have this done on another day.    Centralized Scheduling [facility] and [place] Locations) [phone]

## 2023-10-09 NOTE — Assessment & Plan Note (Signed)
 Ordered lipid panel, pending results. Work on low cholesterol diet and exercise as tolerated

## 2023-10-09 NOTE — Telephone Encounter (Signed)
 Per the appt desk, the [place] dept has already canceled the patients appt for tomorrow morning 10/10/23 at 8am.   The patient, once made aware of the everything, can then call and schedule the new appt.   Thanks!

## 2023-10-10 ENCOUNTER — Ambulatory Visit: Admission: RE | Admit: 2023-10-10 | Payer: BC Managed Care – PPO | Source: Ambulatory Visit

## 2023-10-10 ENCOUNTER — Telehealth: Payer: Self-pay | Admitting: Family

## 2023-10-10 MED ORDER — ZEPBOUND 5 MG/0.5ML ~~LOC~~ SOAJ
5.0000 mg | SUBCUTANEOUS | 0 refills | Status: DC
Start: 2023-10-31 — End: 2023-10-16

## 2023-10-10 MED ORDER — ZEPBOUND 2.5 MG/0.5ML ~~LOC~~ SOAJ
2.5000 mg | SUBCUTANEOUS | 0 refills | Status: DC
Start: 2023-10-10 — End: 2023-10-16

## 2023-10-10 NOTE — Telephone Encounter (Signed)
 Can we submit p/a for zepbound Pt said needs p/a with her insurance

## 2023-10-10 NOTE — Telephone Encounter (Signed)
 Sorry if I already sent a note requesting this, can we add on PTH?

## 2023-10-10 NOTE — Telephone Encounter (Signed)
 Sorry for the delay, in the future if you reach out to my MA lindsay she typically knows the direction of these types of things.  I had a note in the chart at 330 that noted that the u/s was to be changed. The imaging place had made it sound as though pt would be made aware, but it looks like pt was not. Thanks for taking care of this.   I typically am out of office daily around 3 pm so lindsay covers afterwards.

## 2023-10-11 ENCOUNTER — Other Ambulatory Visit (HOSPITAL_COMMUNITY): Payer: Self-pay

## 2023-10-11 ENCOUNTER — Telehealth: Payer: Self-pay

## 2023-10-11 ENCOUNTER — Ambulatory Visit
Admission: RE | Admit: 2023-10-11 | Discharge: 2023-10-11 | Disposition: A | Discharge status: Home / Self Care | Payer: BC Managed Care – PPO | Source: Ambulatory Visit | Attending: Family | Admitting: Family

## 2023-10-11 DIAGNOSIS — I159 Secondary hypertension, unspecified: Secondary | ICD-10-CM | POA: Diagnosis not present

## 2023-10-11 NOTE — Telephone Encounter (Signed)
 Pharmacy Patient Advocate Encounter   Received notification from Patient Advice Request messages that prior authorization for Zepbound 2.5MG /0.5ML pen-injectors is required/requested.   Insurance verification completed.   The patient is insured through [facility] .   Per test claim: PA required; PA submitted to above mentioned insurance via CoverMyMeds Key/confirmation #/EOC [id number] Status is pending

## 2023-10-11 NOTE — Telephone Encounter (Signed)
 Pharmacy Patient Advocate Encounter  Received notification from [facility] that Prior Authorization for Zepbound 2.5MG /0.5ML pen-injectors  has been DENIED.  Full denial letter will be uploaded to the media tab. See denial reason below.   PA #/Case ID/Reference #: [id number]    Denied. This health benefit plan does not cover the following services, supplies, drugs or charges: Any treatment or regimen, medical or surgical, for the purpose of reducing or controlling the weight of the member, or for the treatment of obesity, except for surgical treatment of morbid obesity, or as specifically covered by this health benefit plan

## 2023-10-12 ENCOUNTER — Encounter: Payer: Self-pay | Admitting: Family

## 2023-10-13 ENCOUNTER — Other Ambulatory Visit: Payer: Self-pay | Admitting: Family

## 2023-10-13 DIAGNOSIS — I10 Essential (primary) hypertension: Secondary | ICD-10-CM

## 2023-10-16 ENCOUNTER — Encounter: Payer: Self-pay | Admitting: Surgery

## 2023-10-16 ENCOUNTER — Ambulatory Visit (INDEPENDENT_AMBULATORY_CARE_PROVIDER_SITE_OTHER): Payer: BC Managed Care – PPO | Admitting: Surgery

## 2023-10-16 ENCOUNTER — Encounter: Payer: Self-pay | Admitting: Family

## 2023-10-16 VITALS — BP 153/81 | HR 92 | Temp 99.6°F | Ht 66.0 in | Wt 259.2 lb

## 2023-10-16 DIAGNOSIS — K828 Other specified diseases of gallbladder: Secondary | ICD-10-CM | POA: Diagnosis not present

## 2023-10-16 MED ORDER — CARVEDILOL 6.25 MG PO TABS: 6.2500 mg | ORAL_TABLET | Freq: Two times a day (BID) | ORAL | 3 refills | Status: DC

## 2023-10-16 NOTE — Patient Instructions (Signed)
 You have requested to have your gallbladder removed. This will be done at [facility] with Dr. Aleen Campi.  You will most likely be out of work 1-2 weeks for this surgery.  If you have FMLA or disability paperwork that needs filled out you may drop this off at our office or this can be faxed to [phone].  You will return after your post-op appointment with a lifting restriction for approximately 4 more weeks.  You will be able to eat anything you would like to following surgery. But, start by eating a bland diet and advance this as tolerated. The Gallbladder diet is below, please go as closely by this diet as possible prior to surgery to avoid any further attacks.  Please see the (blue)pre-care form that you have been given today. Our surgery scheduler will call you to verify surgery date and to go over information.   If you have any questions, please call our office.  Laparoscopic Cholecystectomy Laparoscopic cholecystectomy is surgery to remove the gallbladder. The gallbladder is located in the upper right part of the abdomen, behind the liver. It is a storage sac for bile, which is produced in the liver. Bile aids in the digestion and absorption of fats. Cholecystectomy is often done for inflammation of the gallbladder (cholecystitis). This condition is usually caused by a buildup of gallstones (cholelithiasis) in the gallbladder. Gallstones can block the flow of bile, and that can result in inflammation and pain. In severe cases, emergency surgery may be required. If emergency surgery is not required, you will have time to prepare for the procedure. Laparoscopic surgery is an alternative to open surgery. Laparoscopic surgery has a shorter recovery time. Your common bile duct may also need to be examined during the procedure. If stones are found in the common bile duct, they may be removed. LET [facility] CARE PROVIDER KNOW ABOUT: Any allergies you have. All medicines you are taking,  including vitamins, herbs, eye drops, creams, and over-the-counter medicines. Previous problems you or members of your family have had with the use of anesthetics. Any blood disorders you have. Previous surgeries you have had.  Any medical conditions you have. RISKS AND COMPLICATIONS Generally, this is a safe procedure. However, problems may occur, including: Infection. Bleeding. Allergic reactions to medicines. Damage to other structures or organs. A stone remaining in the common bile duct. A bile leak from the cyst duct that is clipped when your gallbladder is removed. The need to convert to open surgery, which requires a larger incision in the abdomen. This may be necessary if your surgeon thinks that it is not safe to continue with a laparoscopic procedure. BEFORE THE PROCEDURE Ask your health care provider about: Changing or stopping your regular medicines. This is especially important if you are taking diabetes medicines or blood thinners. Taking medicines such as aspirin and ibuprofen. These medicines can thin your blood. Do not take these medicines before your procedure if your health care provider instructs you not to. Follow instructions from your health care provider about eating or drinking restrictions. Let your health care provider know if you develop a cold or an infection before surgery. Plan to have someone take you home after the procedure. Ask your health care provider how your surgical site will be marked or identified. You may be given antibiotic medicine to help prevent infection. PROCEDURE To reduce your risk of infection: Your health care team will wash or sanitize their hands. Your skin will be washed with soap. An IV  tube may be inserted into one of your veins. You will be given a medicine to make you fall asleep (general anesthetic). A breathing tube will be placed in your mouth. The surgeon will make several small cuts (incisions) in your abdomen. A thin,  lighted tube (laparoscope) that has a tiny camera on the end will be inserted through one of the small incisions. The camera on the laparoscope will send a picture to a TV screen (monitor) in the operating room. This will give the surgeon a good view inside your abdomen. A gas will be pumped into your abdomen. This will expand your abdomen to give the surgeon more room to perform the surgery. Other tools that are needed for the procedure will be inserted through the other incisions. The gallbladder will be removed through one of the incisions. After your gallbladder has been removed, the incisions will be closed with stitches (sutures), staples, or skin glue. Your incisions may be covered with a bandage (dressing). The procedure may vary among health care providers and hospitals. AFTER THE PROCEDURE Your blood pressure, heart rate, breathing rate, and blood oxygen level will be monitored often until the medicines you were given have worn off. You will be given medicines as needed to control your pain.   This information is not intended to replace advice given to you by your health care provider. Make sure you discuss any questions you have with your health care provider.   Document Released: 08/01/2005 Document Revised: 04/22/2015 Document Reviewed: 03/13/2013 Elsevier Interactive Patient Education 2016 [employer]   Low-Fat Diet for Gallbladder Conditions A low-fat diet can be helpful if you have pancreatitis or a gallbladder condition. With these conditions, your pancreas and gallbladder have trouble digesting fats. A healthy eating plan with less fat will help rest your pancreas and gallbladder and reduce your symptoms. WHAT DO I NEED TO KNOW ABOUT THIS DIET? Eat a low-fat diet. Reduce your fat intake to less than 20-30% of your total daily calories. This is less than 50-60 g of fat per day. Remember that you need some fat in your diet. Ask your dietician what your daily goal should  be. Choose nonfat and low-fat healthy foods. Look for the words "nonfat," "low fat," or "fat free." As a guide, look on the label and choose foods with less than 3 g of fat per serving. Eat only one serving. Avoid alcohol. Do not smoke. If you need help quitting, talk with your health care provider. Eat small frequent meals instead of three large heavy meals. WHAT FOODS CAN I EAT? Grains Include healthy grains and starches such as potatoes, wheat bread, fiber-rich cereal, and brown rice. Choose whole grain options whenever possible. In adults, whole grains should account for 45-65% of your daily calories.  Fruits and Vegetables Eat plenty of fruits and vegetables. Fresh fruits and vegetables add fiber to your diet. Meats and Other Protein Sources Eat lean meat such as chicken and pork. Trim any fat off of meat before cooking it. Eggs, fish, and beans are other sources of protein. In adults, these foods should account for 10-35% of your daily calories. Dairy Choose low-fat milk and dairy options. Dairy includes fat and protein, as well as calcium.  Fats and Oils Limit high-fat foods such as fried foods, sweets, baked goods, sugary drinks.  Other Creamy sauces and condiments, such as mayonnaise, can add extra fat. Think about whether or not you need to use them, or use smaller amounts or low fat options.  WHAT FOODS ARE NOT RECOMMENDED? High fat foods, such as: [employer]. Ice cream. [place] toast. Sweet rolls. Pizza. Cheese bread. Foods covered with batter, butter, creamy sauces, or cheese. Fried foods. Sugary drinks and desserts. Foods that cause gas or bloating   This information is not intended to replace advice given to you by your health care provider. Make sure you discuss any questions you have with your health care provider.   Document Released: 08/06/2013 Document Reviewed: 08/06/2013 Elsevier Interactive Patient Education [employer]

## 2023-10-16 NOTE — H&P (View-Only) (Signed)
 10/16/2023  History of Present Illness: Amy Watson is a 53 y.o. female presenting for follow-up of gallbladder sludge.  The patient was last seen on 01/04/2023.  At that point, she was being evaluated for diarrhea and abdominal pain that have been going on for a few months.  This was related to eating greasy or fatty foods.  She had made some dietary changes and the diarrhea had improved at the time.  Ultrasound of the right upper quadrant done on 12/19/2022 showed biliary sludge.  She has had workup in the past for celiac disease, stool studies for GI pathogens, C. difficile testing, which were all negative.  At the time of our last appointment, she had opted for dietary changes as these had improved her symptoms.  We did prescribe her ursodiol for 3 months which she said somewhat helped but not significantly.  She did not follow-up with me afterwards.  Today, she reports that she continues having issues with diarrhea and bloatedness and discomfort in the right upper quadrant and epigastric areas.  She reports this is still related to foods even though now her dietary changes continue and she eats low-fat diet.  She has also been having issues with hypertension and dehydration from her diarrhea and recently started on a third antihypertensive medication.  She did have a renal ultrasound which did not show any renal artery stenosis.  She denies any palpitations.  Past Medical History: Past Medical History:  Diagnosis Date   Allergy    Asthma    Hypertension    Obesity      Past Surgical History: Past Surgical History:  Procedure Laterality Date   FOOT SURGERY     right, mortons neuroma rescetion   FOOT SURGERY     right plantar fascia torn   NECK SURGERY     laser surgery    Home Medications: Prior to Admission medications   Medication Sig Start Date End Date Taking? Authorizing Provider  amLODipine (NORVASC) 10 MG tablet Take 1 tablet (10 mg total) by mouth daily. 09/28/23  Yes  Dugal, Wyatt Mage, FNP  Calcium Carbonate+Vitamin D (CALCIUM 600+D3) 600-200 MG-UNIT TABS Take 1 tablet by mouth in the morning and at bedtime. 03/23/21  Yes Gweneth Dimitri, MD  carvedilol (COREG) 3.125 MG tablet Take 1 tablet (3.125 mg total) by mouth 2 (two) times daily with a meal. 10/09/23  Yes Dugal, Tabitha, FNP  cetirizine (ZYRTEC) 10 MG tablet Take 10 mg by mouth as needed.    Yes [provider]  Cholecalciferol (VITAMIN D3) 1.25 MG (50000 UT) CAPS TAKE 1 TABLET BY MOUTH ONCE A WEEK. 07/01/20  Yes Gweneth Dimitri, MD  escitalopram (LEXAPRO) 20 MG tablet TAKE 1 TABLET BY MOUTH EVERY DAY 08/28/23  Yes Dugal, Tabitha, FNP  fluticasone furoate-vilanterol (BREO ELLIPTA) 200-25 MCG/ACT AEPB 1 puff Inhalation Once a day for 30 days   Yes [provider]  [name] 24 FE 1-20 MG-MCG(24) tablet TAKE 1 TABLET BY MOUTH EVERY DAY 05/23/23  Yes Dugal, Tabitha, FNP  montelukast (SINGULAIR) 10 MG tablet Take by mouth as needed.   Yes [provider]  PROAIR HFA 108 (90 Base) MCG/ACT inhaler TAKE 1 2 PUFFS AS NEEDED EVERY 4 6 HOURS AS NEEDED FOR COUGH/WHEEZE 11/08/18  Yes [provider]  valsartan (DIOVAN) 320 MG tablet Take 1 tablet (320 mg total) by mouth daily. 08/22/23  Yes Mort Sawyers, FNP    Allergies: Allergies  Allergen Reactions   Hydrochlorothiazide Other (See Comments)    Dehydration  Prozac [Fluoxetine] Other (See Comments)    fatigue   Azithromycin Other (See Comments)    C. Diff./  Pain     Review of Systems: Review of Systems  Constitutional:  Negative for fever.  HENT:  Negative for hearing loss.   Respiratory:  Negative for shortness of breath.   Cardiovascular:  Negative for chest pain.  Gastrointestinal:  Positive for abdominal pain and diarrhea. Negative for nausea and vomiting.  Genitourinary:  Negative for dysuria.  Musculoskeletal:  Negative for myalgias.  Skin:  Negative for rash.  Neurological:  Negative for dizziness.   Psychiatric/Behavioral:  Negative for depression.     Physical Exam BP (!) 153/81   Pulse 92   Temp 99.6 F (37.6 C) (Oral)   Ht 5\' 6"  (1.676 m)   Wt 259 lb 3.2 oz (117.6 kg)   SpO2 97%   BMI 41.84 kg/m  CONSTITUTIONAL: No acute distress. HEENT:  Normocephalic, atraumatic, extraocular motion intact. NECK: Trachea is midline, no jugular venous distention. RESPIRATORY:  Lungs are clear, and breath sounds are equal bilaterally. Normal respiratory effort without pathologic use of accessory muscles. CARDIOVASCULAR: Heart is regular without murmurs, gallops, or rubs. GI: The abdomen is soft, non-distended, with mild tenderness in epigastric and RUQ areas.  Negative Murphy's sign.  MUSCULOSKELETAL: Normal gait, no peripheral edema. NEUROLOGIC:  Motor and sensation is grossly normal.  Cranial nerves are grossly intact. PSYCH:  Alert and oriented to person, place and time. Affect is normal.  Labs/Imaging: Ultrasound abdomen on 12/19/2022: IMPRESSION: 1. 6.1 mm polyp versus tumefactive sludge in the gallbladder. Recommend follow-up ultrasound in 12 months to ensure stability. 2. No other abnormalities.  Assessment and Plan: This is a 53 y.o. female with gallbladder sludge.  - Discussed with the patient that potentially her gallbladder/could be worsening which is leading to more constant symptoms despite of dietary changes.  Discussed with her that gallbladder issues would not be directly related to hypertension but this could be contributing to her abdominal discomfort and diarrhea.  Discussed with her that given the findings and progressing symptoms, I think it would be appropriate to proceed with cholecystectomy to least help with some of her symptoms.  She is in agreement with this. - Discussed with her then the plan for robotic assisted cholecystectomy and reviewed the surgery at length with her including the planned incisions, risks of bleeding, infection, injury to surrounding  structures, the use of ICG to better evaluate the biliary anatomy, that this would be an outpatient procedure, postoperative activity restrictions, pain control, and she is willing to proceed. - We will schedule the patient for surgery on 10/26/2023.  All of her questions have been answered.  I spent 40 minutes dedicated to the care of this patient on the date of this encounter to include pre-visit review of records, face-to-face time with the patient discussing diagnosis and management, and any post-visit coordination of care.   Howie Ill, MD [facility]

## 2023-10-16 NOTE — Progress Notes (Signed)
 10/16/2023  History of Present Illness: Amy Watson is a 53 y.o. female presenting for follow-up of gallbladder sludge.  The patient was last seen on 01/04/2023.  At that point, she was being evaluated for diarrhea and abdominal pain that have been going on for a few months.  This was related to eating greasy or fatty foods.  She had made some dietary changes and the diarrhea had improved at the time.  Ultrasound of the right upper quadrant done on 12/19/2022 showed biliary sludge.  She has had workup in the past for celiac disease, stool studies for GI pathogens, C. difficile testing, which were all negative.  At the time of our last appointment, she had opted for dietary changes as these had improved her symptoms.  We did prescribe her ursodiol for 3 months which she said somewhat helped but not significantly.  She did not follow-up with me afterwards.  Today, she reports that she continues having issues with diarrhea and bloatedness and discomfort in the right upper quadrant and epigastric areas.  She reports this is still related to foods even though now her dietary changes continue and she eats low-fat diet.  She has also been having issues with hypertension and dehydration from her diarrhea and recently started on a third antihypertensive medication.  She did have a renal ultrasound which did not show any renal artery stenosis.  She denies any palpitations.  Past Medical History: Past Medical History:  Diagnosis Date   Allergy    Asthma    Hypertension    Obesity      Past Surgical History: Past Surgical History:  Procedure Laterality Date   FOOT SURGERY     right, mortons neuroma rescetion   FOOT SURGERY     right plantar fascia torn   NECK SURGERY     laser surgery    Home Medications: Prior to Admission medications   Medication Sig Start Date End Date Taking? Authorizing Provider  amLODipine (NORVASC) 10 MG tablet Take 1 tablet (10 mg total) by mouth daily. 09/28/23  Yes  Dugal, Wyatt Mage, FNP  Calcium Carbonate+Vitamin D (CALCIUM 600+D3) 600-200 MG-UNIT TABS Take 1 tablet by mouth in the morning and at bedtime. 03/23/21  Yes Gweneth Dimitri, MD  carvedilol (COREG) 3.125 MG tablet Take 1 tablet (3.125 mg total) by mouth 2 (two) times daily with a meal. 10/09/23  Yes Dugal, Tabitha, FNP  cetirizine (ZYRTEC) 10 MG tablet Take 10 mg by mouth as needed.    Yes [provider]  Cholecalciferol (VITAMIN D3) 1.25 MG (50000 UT) CAPS TAKE 1 TABLET BY MOUTH ONCE A WEEK. 07/01/20  Yes Gweneth Dimitri, MD  escitalopram (LEXAPRO) 20 MG tablet TAKE 1 TABLET BY MOUTH EVERY DAY 08/28/23  Yes Dugal, Tabitha, FNP  fluticasone furoate-vilanterol (BREO ELLIPTA) 200-25 MCG/ACT AEPB 1 puff Inhalation Once a day for 30 days   Yes [provider]  [name] 24 FE 1-20 MG-MCG(24) tablet TAKE 1 TABLET BY MOUTH EVERY DAY 05/23/23  Yes Dugal, Tabitha, FNP  montelukast (SINGULAIR) 10 MG tablet Take by mouth as needed.   Yes [provider]  PROAIR HFA 108 (90 Base) MCG/ACT inhaler TAKE 1 2 PUFFS AS NEEDED EVERY 4 6 HOURS AS NEEDED FOR COUGH/WHEEZE 11/08/18  Yes [provider]  valsartan (DIOVAN) 320 MG tablet Take 1 tablet (320 mg total) by mouth daily. 08/22/23  Yes Mort Sawyers, FNP    Allergies: Allergies  Allergen Reactions   Hydrochlorothiazide Other (See Comments)    Dehydration  Prozac [Fluoxetine] Other (See Comments)    fatigue   Azithromycin Other (See Comments)    C. Diff./  Pain     Review of Systems: Review of Systems  Constitutional:  Negative for fever.  HENT:  Negative for hearing loss.   Respiratory:  Negative for shortness of breath.   Cardiovascular:  Negative for chest pain.  Gastrointestinal:  Positive for abdominal pain and diarrhea. Negative for nausea and vomiting.  Genitourinary:  Negative for dysuria.  Musculoskeletal:  Negative for myalgias.  Skin:  Negative for rash.  Neurological:  Negative for dizziness.   Psychiatric/Behavioral:  Negative for depression.     Physical Exam BP (!) 153/81   Pulse 92   Temp 99.6 F (37.6 C) (Oral)   Ht 5\' 6"  (1.676 m)   Wt 259 lb 3.2 oz (117.6 kg)   SpO2 97%   BMI 41.84 kg/m  CONSTITUTIONAL: No acute distress. HEENT:  Normocephalic, atraumatic, extraocular motion intact. NECK: Trachea is midline, no jugular venous distention. RESPIRATORY:  Lungs are clear, and breath sounds are equal bilaterally. Normal respiratory effort without pathologic use of accessory muscles. CARDIOVASCULAR: Heart is regular without murmurs, gallops, or rubs. GI: The abdomen is soft, non-distended, with mild tenderness in epigastric and RUQ areas.  Negative Murphy's sign.  MUSCULOSKELETAL: Normal gait, no peripheral edema. NEUROLOGIC:  Motor and sensation is grossly normal.  Cranial nerves are grossly intact. PSYCH:  Alert and oriented to person, place and time. Affect is normal.  Labs/Imaging: Ultrasound abdomen on 12/19/2022: IMPRESSION: 1. 6.1 mm polyp versus tumefactive sludge in the gallbladder. Recommend follow-up ultrasound in 12 months to ensure stability. 2. No other abnormalities.  Assessment and Plan: This is a 53 y.o. female with gallbladder sludge.  - Discussed with the patient that potentially her gallbladder/could be worsening which is leading to more constant symptoms despite of dietary changes.  Discussed with her that gallbladder issues would not be directly related to hypertension but this could be contributing to her abdominal discomfort and diarrhea.  Discussed with her that given the findings and progressing symptoms, I think it would be appropriate to proceed with cholecystectomy to least help with some of her symptoms.  She is in agreement with this. - Discussed with her then the plan for robotic assisted cholecystectomy and reviewed the surgery at length with her including the planned incisions, risks of bleeding, infection, injury to surrounding  structures, the use of ICG to better evaluate the biliary anatomy, that this would be an outpatient procedure, postoperative activity restrictions, pain control, and she is willing to proceed. - We will schedule the patient for surgery on 10/26/2023.  All of her questions have been answered.  I spent 40 minutes dedicated to the care of this patient on the date of this encounter to include pre-visit review of records, face-to-face time with the patient discussing diagnosis and management, and any post-visit coordination of care.   Howie Ill, MD [facility]

## 2023-10-17 ENCOUNTER — Encounter: Payer: Self-pay | Admitting: *Deleted

## 2023-10-17 ENCOUNTER — Telehealth: Payer: Self-pay | Admitting: Surgery

## 2023-10-17 NOTE — Telephone Encounter (Signed)
 Patient has been advised of Pre-Admission date/time, and Surgery date at [facility].  Surgery Date: 10/26/23 Preadmission Testing Date: 10/20/23 (phone 1p-4p)  Patient has been made aware to call [phone], between 1-3:00pm the day before surgery, to find out what time to arrive for surgery.

## 2023-10-18 ENCOUNTER — Other Ambulatory Visit (HOSPITAL_COMMUNITY): Payer: Self-pay

## 2023-10-19 ENCOUNTER — Other Ambulatory Visit: Payer: Self-pay

## 2023-10-19 ENCOUNTER — Other Ambulatory Visit (INDEPENDENT_AMBULATORY_CARE_PROVIDER_SITE_OTHER)

## 2023-10-19 DIAGNOSIS — I1A0 Resistant hypertension: Secondary | ICD-10-CM

## 2023-10-20 ENCOUNTER — Encounter
Admission: RE | Admit: 2023-10-20 | Discharge: 2023-10-20 | Disposition: A | Discharge status: Home / Self Care | Source: Ambulatory Visit | Attending: Surgery | Admitting: Surgery

## 2023-10-20 ENCOUNTER — Other Ambulatory Visit
Admission: RE | Admit: 2023-10-20 | Discharge: 2023-10-20 | Disposition: A | Discharge status: Home / Self Care | Payer: Self-pay | Source: Ambulatory Visit | Attending: Medical Genetics | Admitting: Medical Genetics

## 2023-10-20 ENCOUNTER — Other Ambulatory Visit: Payer: Self-pay

## 2023-10-20 ENCOUNTER — Encounter: Payer: Self-pay | Admitting: Family

## 2023-10-20 DIAGNOSIS — Z01812 Encounter for preprocedural laboratory examination: Secondary | ICD-10-CM

## 2023-10-20 HISTORY — DX: Anxiety disorder, unspecified: F41.9

## 2023-10-20 HISTORY — DX: Unspecified osteoarthritis, unspecified site: M19.90

## 2023-10-20 HISTORY — DX: Depression, unspecified: F32.A

## 2023-10-20 HISTORY — DX: Nausea with vomiting, unspecified: R11.2

## 2023-10-20 HISTORY — DX: COVID-19: U07.1

## 2023-10-20 HISTORY — DX: Dyspnea, unspecified: R06.00

## 2023-10-20 HISTORY — DX: Other specified postprocedural states: Z98.890

## 2023-10-20 HISTORY — DX: Prediabetes: R73.03

## 2023-10-20 LAB — PTH, INTACT AND CALCIUM
Calcium: 9.1 mg/dL (ref 8.6–10.4)
PTH: 36 pg/mL (ref 16–77)

## 2023-10-20 NOTE — Patient Instructions (Addendum)
 Your procedure is scheduled on: 10/26/23 - Thursday Report to the Registration Desk on the 1st floor of the [facility]. To find out your arrival time, please call [phone] between 1PM - 3PM on: 10/25/23 - Wednesday If your arrival time is 6:00 am, do not arrive before that time as the [facility] entrance doors do not open until 6:00 am.  REMEMBER: Instructions that are not followed completely may result in serious medical risk, up to and including death; or upon the discretion of your surgeon and anesthesiologist your surgery may need to be rescheduled.  Do not eat food after midnight the night before surgery.  No gum chewing or hard candies.  You may however, drink CLEAR liquids up to 2 hours before you are scheduled to arrive for your surgery. Do not drink anything within 2 hours of your scheduled arrival time.  Clear liquids include: - water  - apple juice without pulp - gatorade (not RED colors) - black coffee or tea (Do NOT add milk or creamers to the coffee or tea) Do NOT drink anything that is not on this list.  One week prior to surgery: Stop Anti-inflammatories (NSAIDS) such as Advil, Aleve, Ibuprofen, Motrin, Naproxen, Naprosyn and Aspirin based products such as Excedrin, Goody's Powder, BC Powder. You may take Tylenol if needed for pain up until the day of surgery.  Stop ANY OVER THE COUNTER supplements until after surgery : B Complex Vitamins ,CALCIUM 600+D3 .  HOLD valsartan (DIOVAN) on the day of surgery   ON THE DAY OF SURGERY ONLY TAKE THESE MEDICATIONS WITH SIPS OF WATER:  amLODipine (NORVASC)  busPIRone (BUSPAR)  carvedilol (COREG)  escitalopram (LEXAPRO)  BREO ELLIPTA [name] 24 FE   Use inhalers PROAIR HFA on the day of surgery and bring to the hospital.  No Alcohol for 24 hours before or after surgery.  No Smoking including e-cigarettes for 24 hours before surgery.  No chewable tobacco products for at least 6 hours before surgery.  No nicotine  patches on the day of surgery.  Do not use any "recreational" drugs for at least a week (preferably 2 weeks) before your surgery.  Please be advised that the combination of cocaine and anesthesia may have negative outcomes, up to and including death. If you test positive for cocaine, your surgery will be cancelled.  On the morning of surgery brush your teeth with toothpaste and water, you may rinse your mouth with mouthwash if you wish. Do not swallow any toothpaste or mouthwash.  Use CHG Soap or wipes as directed on instruction sheet.  Do not wear jewelry, make-up, hairpins, clips or nail polish.  For welded (permanent) jewelry: bracelets, anklets, waist bands, etc.  Please have this removed prior to surgery.  If it is not removed, there is a chance that hospital personnel will need to cut it off on the day of surgery.  Do not wear lotions, powders, or perfumes.   Do not shave body hair from the neck down 48 hours before surgery.  Contact lenses, hearing aids and dentures may not be worn into surgery.  Do not bring valuables to the hospital. [facility] is not responsible for any missing/lost belongings or valuables.   Notify your doctor if there is any change in your medical condition (cold, fever, infection).  Wear comfortable clothing (specific to your surgery type) to the hospital.  After surgery, you can help prevent lung complications by doing breathing exercises.  Take deep breaths and cough every 1-2 hours.  Your doctor may order a device called an Incentive Spirometer to help you take deep breaths. When coughing or sneezing, hold a pillow firmly against your incision with both hands. This is called "splinting." Doing this helps protect your incision. It also decreases belly discomfort.  If you are being admitted to the hospital overnight, leave your suitcase in the car. After surgery it may be brought to your room.  In case of increased patient census, it may be necessary  for you, the patient, to continue your postoperative care in the Same Day Surgery department.  If you are being discharged the day of surgery, you will not be allowed to drive home. You will need a responsible individual to drive you home and stay with you for 24 hours after surgery.   If you are taking public transportation, you will need to have a responsible individual with you.  Please call the Pre-admissions Testing Dept. at [phone] if you have any questions about these instructions.  Surgery Visitation Policy:  Patients having surgery or a procedure may have two visitors.  Children under the age of 66 must have an adult with them who is not the patient.  Temporary Visitor Restrictions Due to increasing cases of flu, RSV and COVID-19: Children ages 25 and under will not be able to visit patients in [facility] hospitals under most circumstances.  Inpatient Visitation:    Visiting hours are 7 a.m. to 8 p.m. Up to four visitors are allowed at one time in a patient room. The visitors may rotate out with other people during the day.  One visitor age 18 or older may stay with the patient overnight and must be in the room by 8 p.m.     Preparing for Surgery with CHLORHEXIDINE GLUCONATE (CHG) Soap  Chlorhexidine Gluconate (CHG) Soap  o An antiseptic cleaner that kills germs and bonds with the skin to continue killing germs even after washing  o Used for showering the night before surgery and morning of surgery  Before surgery, you can play an important role by reducing the number of germs on your skin.  CHG (Chlorhexidine gluconate) soap is an antiseptic cleanser which kills germs and bonds with the skin to continue killing germs even after washing.  Please do not use if you have an allergy to CHG or antibacterial soaps. If your skin becomes reddened/irritated stop using the CHG.  1. Shower the NIGHT BEFORE SURGERY and the MORNING OF SURGERY with CHG soap.  2. If you  choose to wash your hair, wash your hair first as usual with your normal shampoo.  3. After shampooing, rinse your hair and body thoroughly to remove the shampoo.  4. Use CHG as you would any other liquid soap. You can apply CHG directly to the skin and wash gently with a scrungie or a clean washcloth.  5. Apply the CHG soap to your body only from the neck down. Do not use on open wounds or open sores. Avoid contact with your eyes, ears, mouth, and genitals (private parts). Wash face and genitals (private parts) with your normal soap.  6. Wash thoroughly, paying special attention to the area where your surgery will be performed.  7. Thoroughly rinse your body with warm water.  8. Do not shower/wash with your normal soap after using and rinsing off the CHG soap.  9. Pat yourself dry with a clean towel.  10. Wear clean pajamas to bed the night before surgery.  12. Place clean sheets  on your bed the night of your first shower and do not sleep with pets.  13. Shower again with the CHG soap on the day of surgery prior to arriving at the hospital.  14. Do not apply any deodorants/lotions/powders.  15. Please wear clean clothes to the hospital.

## 2023-10-23 ENCOUNTER — Encounter (HOSPITAL_BASED_OUTPATIENT_CLINIC_OR_DEPARTMENT_OTHER): Payer: Self-pay | Admitting: Cardiovascular Disease

## 2023-10-23 ENCOUNTER — Ambulatory Visit (INDEPENDENT_AMBULATORY_CARE_PROVIDER_SITE_OTHER): Admitting: Cardiovascular Disease

## 2023-10-23 VITALS — BP 143/74 | HR 93 | Ht 66.0 in | Wt 264.4 lb

## 2023-10-23 DIAGNOSIS — Z5181 Encounter for therapeutic drug level monitoring: Secondary | ICD-10-CM

## 2023-10-23 DIAGNOSIS — I10 Essential (primary) hypertension: Secondary | ICD-10-CM

## 2023-10-23 DIAGNOSIS — R7303 Prediabetes: Secondary | ICD-10-CM | POA: Diagnosis not present

## 2023-10-23 MED ORDER — HYDROCHLOROTHIAZIDE 25 MG PO TABS: 25.0000 mg | ORAL_TABLET | Freq: Every day | ORAL | 3 refills | Status: DC

## 2023-10-23 NOTE — Progress Notes (Signed)
 [facility] Initial Assessment:    Date:  10/23/2023   ID:  Amy Watson, DOB 1971/07/12, MRN [id number]  PCP:  Amy Sawyers, FNP  Cardiologist:  None  Nephrologist:  Referring MD: Amy Sawyers, FNP   CC: Hypertension  History of Present Illness:    Amy Watson is a 53 y.o. female with a hx of hypertension and obesity here to establish care in the [facility]. Amy Watson has been working with her PCP, Amy Salvo, FNP on blood pressure control.  At her appointment 08/2023 and blood pressure was averaging 145-160/85.  Amy Watson was otherwise feeling well.  At the time Amy Watson was taking valsartan.  Amlodipine was added.  Amy Watson had renal artery Dopplers 09/2023 that were negative for RAS.  Amlodipine was increased and blood pressures remained elevated.  Amy Watson is not a candidate for HCTZ due to history of intravascular volume completion.  Low-dose carvedilol was added.  Sleep study was ordered.  Amy Watson notes that her hypertension has been challenging to manage despite the use of multiple antihypertensive medications, including amlodipine, carvedilol, and valsartan. Previously, Amy Watson was on lisinopril and hydrochlorothiazide, which provided better control. In September, her medication was changed to valsartan due to high readings of 160/170 mmHg. Amy Watson monitors her blood pressure at work, noting it often reaches 160/170 mmHg.  Amy Watson was told that her pre-syncopal episode was due to dehydration and HCTZ was discontinued.     Amy Watson experiences persistent dehydration symptoms, including constant thirst and dry skin, despite drinking 8 to 12 glasses of water daily. Amy Watson has used a hydration IV once, but it did not significantly improve her symptoms. Her body composition scale shows her body water percentage fluctuating between 28% and 30%, below the expected range of 40% to 60%.  In September, Amy Watson experienced an episode of passing out, which led to a comprehensive  evaluation for potential causes such as stroke, blood pressure issues, or dehydration. Extensive workup including EKGs and orthostatic blood pressure measurements did not reveal significant abnormalities. Amy Watson followed up with her PCP and the episode does not appear to be related to orthostatic hypotension or dehydration, as lab results did not indicate dehydration.  Amy Watson has gained approximately 30 pounds over the past year, likely due to decreased mobility from foot and knee surgeries. Amy Watson had plantar fascia surgery and was in a boot for nine months, followed by a knee surgery in January for a torn meniscus and sprained MCL. Amy Watson has recently resumed exercising with a trainer, performing activities like bench squats and leg presses, and has been walking more.   Office orthostatic VS 05/15/23: Laying: 96%, 91 pulse, 138/22 Sitting: 95%, 92 pulse, 142/92 Standing: 96%, 90 pulse, 134/86  Amy Watson is making lifestyle changes to improve overall health, including reducing sodium intake, increasing water consumption, and engaging in regular physical activity. Amy Watson alternates between cooking at home and eating out, focusing on low-fat and low-sodium options. Amy Watson drinks one to two cups of coffee in the morning and occasionally has a Coke. Amy Watson does not smoke or drink alcohol regularly.     Previous antihypertensives:    Past Medical History:  Diagnosis Date   Allergy    Anxiety    Arthritis    Asthma    COVID-19    Depression    Dyspnea    Gallbladder sludge 2025   Hypertension    Obesity    PONV (postoperative nausea and vomiting)    Pre-diabetes     Past  Surgical History:  Procedure Laterality Date   COLONOSCOPY     DENTAL SURGERY     FOOT SURGERY     right, mortons neuroma rescetion   FOOT SURGERY     right plantar fascia torn   Knee arthoscopy Left 2025   NECK SURGERY     laser surgery    Current Medications: Current Meds  Medication Sig   amLODipine (NORVASC) 10 MG tablet Take 1  tablet (10 mg total) by mouth daily.   B Complex Vitamins (B COMPLEX PO) Take 1 tablet by mouth daily.   busPIRone (BUSPAR) 10 MG tablet Take 10 mg by mouth 2 (two) times daily.   Calcium Carbonate+Vitamin D (CALCIUM 600+D3) 600-200 MG-UNIT TABS Take 1 tablet by mouth in the morning and at bedtime.   cetirizine (ZYRTEC) 10 MG tablet Take 10 mg by mouth daily as needed for allergies.   Cholecalciferol (VITAMIN D3) 1.25 MG (50000 UT) CAPS TAKE 1 TABLET BY MOUTH ONCE A WEEK.   escitalopram (LEXAPRO) 20 MG tablet TAKE 1 TABLET BY MOUTH EVERY DAY   fluticasone furoate-vilanterol (BREO ELLIPTA) 200-25 MCG/ACT AEPB Inhale 1 puff into the lungs daily.   [name] 24 FE 1-20 MG-MCG(24) tablet TAKE 1 TABLET BY MOUTH EVERY DAY   hydrochlorothiazide (HYDRODIURIL) 25 MG tablet Take 1 tablet (25 mg total) by mouth daily.   montelukast (SINGULAIR) 10 MG tablet Take 10 mg by mouth at bedtime as needed (allergies).   PROAIR HFA 108 (90 Base) MCG/ACT inhaler Inhale 1-2 puffs into the lungs every 4 (four) hours as needed for wheezing or shortness of breath.   valsartan (DIOVAN) 320 MG tablet Take 1 tablet (320 mg total) by mouth daily.   [DISCONTINUED] carvedilol (COREG) 6.25 MG tablet Take 1 tablet (6.25 mg total) by mouth 2 (two) times daily with a meal.     Allergies:   Hydrochlorothiazide, Prozac [fluoxetine], and Azithromycin   Social History   Socioeconomic History   Marital status: Divorced    Spouse name: Not on file   Number of children: 2   Years of education: some college   Highest education level: Some college, no degree  Occupational History   Not on file  Tobacco Use   Smoking status: Never   Smokeless tobacco: Never  Vaping Use   Vaping status: Never Used  Substance and Sexual Activity   Alcohol use: Yes    Comment: rarely   Drug use: No   Sexual activity: Not Currently  Other Topics Concern   Not on file  Social History Narrative   05/06/20   From: [place] Watson   Living: oldest  daughter Amy Watson)   Work: Production assistant, radio and [employer]      Family: 2 children - Insurance claims handler and Maralyn Sago      Enjoys: read, yardwork      Exercise: parttime job - steps at work   Diet: working with obesity specialist to try to reduce appetite      Safety   Seat belts: Yes    Guns: Yes  and secure   Safe in relationships: Yes    Social Drivers of Corporate investment banker Strain: Medium Risk (09/13/2023)   Overall Financial Resource Strain (CARDIA)    Difficulty of Paying Living Expenses: Somewhat hard  Food Insecurity: No Food Insecurity (09/13/2023)   Hunger Vital Sign    Worried About Running Out of Food in the Last Year: Never true    Ran Out of Food in the Last Year: Never true  Transportation Needs: No Transportation Needs (09/13/2023)   PRAPARE - Administrator, Civil Service (Medical): No    Lack of Transportation (Non-Medical): No  Physical Activity: Inactive (09/13/2023)   Exercise Vital Sign    Days of Exercise per Week: 0 days    Minutes of Exercise per Session: 20 min  Stress: No Stress Concern Present (09/13/2023)   [employer] of Occupational Health - Occupational Stress Questionnaire    Feeling of Stress : Only a little  Social Connections: Unknown (09/13/2023)   Social Connection and Isolation Panel [NHANES]    Frequency of Communication with Friends and Family: More than three times a week    Frequency of Social Gatherings with Friends and Family: Twice a week    Attends Religious Services: Patient declined    Database administrator or Organizations: No    Attends Engineer, structural: Not on file    Marital Status: Patient declined     Family History: The patient's family history includes Breast cancer (age of onset: 56) in her mother; Diabetes in her father; Hypertension in her father; Skin cancer in her maternal grandfather; Stroke in her maternal aunt.  ROS:   Please see the history of present illness.     All other systems reviewed  and are negative.  EKGs/Labs/Other Studies Reviewed:    EKG:  EKG is not ordered today.   05-10-23: Sinus rhythm.  Rate 80 bpm.  Recent Labs: 05/09/2023: BUN 13; Creatinine, Ser 0.60; Potassium 4.3; Sodium 136 09/14/2023: Hemoglobin 14.1; Platelets 271.0; TSH 1.13   Recent Lipid Panel    Component Value Date/Time   CHOL 156 09/14/2023 0750   CHOL 186 07/17/2019 0844   TRIG 140.0 09/14/2023 0750   HDL 42.80 09/14/2023 0750   HDL 54 07/17/2019 0844   CHOLHDL 4 09/14/2023 0750   VLDL 28.0 09/14/2023 0750   LDLCALC 86 09/14/2023 0750   LDLCALC 114 (H) 07/17/2019 0844    Physical Exam:   VS:  BP (!) 143/74 (BP Location: Right Arm, Patient Position: Sitting, Cuff Size: Large)   Pulse 93   Ht 5\' 6"  (1.676 m)   Wt 264 lb 6.4 oz (119.9 kg)   SpO2 97%   BMI 42.68 kg/m  , BMI Body mass index is 42.68 kg/m. GENERAL:  Well appearing HEENT: Pupils equal round and reactive, fundi not visualized, oral mucosa unremarkable NECK:  No jugular venous distention, waveform within normal limits, carotid upstroke brisk and symmetric, no bruits, no thyromegaly LUNGS:  Clear to auscultation bilaterally HEART:  RRR.  PMI not displaced or sustained,S1 and S2 within normal limits, no S3, no S4, no clicks, no rubs, no murmurs ABD:  Flat, positive bowel sounds normal in frequency in pitch, no bruits, no rebound, no guarding, no midline pulsatile mass, no hepatomegaly, no splenomegaly EXT:  2 plus pulses throughout, no edema, no cyanosis no clubbing SKIN:  No rashes no nodules NEURO:  Cranial nerves II through XII grossly intact, motor grossly intact throughout PSYCH:  Cognitively intact, oriented to person place and time   ASSESSMENT/PLAN:    # Hypertension Uncontrolled on current regimen of amlodipine, carvedilol, and valsartan. Previous episode of syncope, but not likely due to orthostatic hypotension. Sodium levels low-normal, opposite of what would be expected in dehydration.  BUN and creatinine  were normal.  No elevated urine specific gravity.  Office VS negative for orthostatic changes.  Amy Watson also described walking and passing out suddenly with no warning, which is abnormal  for orthostatic syncope.  -Discontinue carvedilol. -Continue amlodipine and valsartan. -Add back hydrochlorothiazide 25mg  daily one week post-gallbladder surgery.  BP was much better controlled on this medication.  -Check basic metabolic panel one week after starting hydrochlorothiazide. -If Amy Watson has recurrent syncope would place an ambulatory monitor and get an echo.  # Sleep Apnea Sleep study pending. -Await results of sleep study.  # Musculoskeletal issues Recent knee surgery for torn meniscus and sprained MCL. Previous foot surgery for plantar fasciitis. Limited mobility and weight gain due to these issues. -Continue with physical therapy and exercise as tolerated.  Follow-up in 2 months to reassess blood pressure control and overall health status.      Screening for Secondary Hypertension:     10/23/2023   11:14 AM  Causes  Drugs/Herbals Screened     - Comments limits sodium intake.  1-2 coffees daily.  Rare EtOH. not smoking history.  Renovascular HTN Screened  Sleep Apnea Screened     - Comments sleep study pending  Thyroid Disease Screened  Hyperaldosteronism Screened     - Comments check renin/aldo  Pheochromocytoma N/A  Cushing's Syndrome Screened     - Comments check cortisol  Hyperparathyroidism Screened  Coarctation of the Aorta Screened     - Comments BP symmetric  Compliance Screened    Relevant Labs/Studies:    Latest Ref Rng & Units 05/09/2023    9:22 AM 11/10/2022   12:27 PM 12/20/2021   12:41 PM  Basic Labs  Sodium 135 - 145 mmol/L 136  135  136   Potassium 3.5 - 5.1 mmol/L 4.3  3.8  4.0   Creatinine 0.44 - 1.00 mg/dL 8.29  5.62  1.30        Latest Ref Rng & Units 09/14/2023    7:50 AM 03/23/2021    3:21 PM  Thyroid   TSH 0.35 - 5.50 uIU/mL 1.13  1.93    Disposition:     FU with MD/PharmD in 2 months   Medication Adjustments/Labs and Tests Ordered: Current medicines are reviewed at length with the patient today.  Concerns regarding medicines are outlined above.  Orders Placed This Encounter  Procedures   Basic metabolic panel   Meds ordered this encounter  Medications   hydrochlorothiazide (HYDRODIURIL) 25 MG tablet    Sig: Take 1 tablet (25 mg total) by mouth daily.    Dispense:  90 tablet    Refill:  3    D/C CARVEDILOL     Signed, Chilton Si, MD  10/23/2023 2:50 PM    [facility]

## 2023-10-23 NOTE — Patient Instructions (Addendum)
 Medication Instructions:  AFTER YOUR PROCEDURE STOP CARVEDILOL AND START hydrochlorothiazide 25 MG DAILY   Labwork: BMET 1 WEEK AFTER STARTING hydrochlorothiazide   Testing/Procedures: NONE  Follow-Up: 2 MONTHS WITH CAITLIN W NP OR DR [place]   Any Other Special Instructions Will Be Listed Below (If Applicable). MONITOR YOUR BLOOD PRESSURE TWICE A DAY, LOG IN THE BOOK PROVIDED. BRING THE BOOK AND YOUR BLOOD PRESSURE MACHINE TO YOUR FOLLOW UP IN 1 MONTH   If you need a refill on your cardiac medications before your next appointment, please call your pharmacy.

## 2023-10-25 ENCOUNTER — Other Ambulatory Visit (HOSPITAL_COMMUNITY): Payer: Self-pay

## 2023-10-25 ENCOUNTER — Telehealth: Payer: Self-pay

## 2023-10-25 MED ORDER — LACTATED RINGERS IV SOLN: INTRAVENOUS | Status: DC

## 2023-10-25 MED ORDER — ORAL CARE MOUTH RINSE: 15.0000 mL | Freq: Once | OROMUCOSAL | Status: AC

## 2023-10-25 MED ORDER — ACETAMINOPHEN 500 MG PO TABS
1000.0000 mg | ORAL_TABLET | ORAL | Status: AC
  Administered 2023-10-26: 1000 mg via ORAL

## 2023-10-25 MED ORDER — BUPIVACAINE LIPOSOME 1.3 % IJ SUSP: 10.0000 mL | Freq: Once | INTRAMUSCULAR | Status: DC

## 2023-10-25 MED ORDER — CHLORHEXIDINE GLUCONATE CLOTH 2 % EX PADS: 6.0000 | MEDICATED_PAD | Freq: Once | CUTANEOUS | Status: DC

## 2023-10-25 MED ORDER — GABAPENTIN 300 MG PO CAPS
300.0000 mg | ORAL_CAPSULE | ORAL | Status: AC
  Administered 2023-10-26: 300 mg via ORAL

## 2023-10-25 MED ORDER — CHLORHEXIDINE GLUCONATE CLOTH 2 % EX PADS
6.0000 | MEDICATED_PAD | Freq: Once | CUTANEOUS | Status: DC
Start: 2023-10-25 — End: 2023-10-26

## 2023-10-25 MED ORDER — CEFAZOLIN SODIUM-DEXTROSE 2-4 GM/100ML-% IV SOLN
2.0000 g | INTRAVENOUS | Status: AC
  Administered 2023-10-26: 2 g via INTRAVENOUS

## 2023-10-25 MED ORDER — CHLORHEXIDINE GLUCONATE 0.12 % MT SOLN
15.0000 mL | Freq: Once | OROMUCOSAL | Status: AC
  Administered 2023-10-26: 15 mL via OROMUCOSAL

## 2023-10-25 MED ORDER — INDOCYANINE GREEN 25 MG IV SOLR
1.2500 mg | INTRAVENOUS | Status: AC
Start: 2023-10-25 — End: 2023-10-26
  Administered 2023-10-26: 1.25 mg via INTRAVENOUS

## 2023-10-25 NOTE — Telephone Encounter (Signed)
 Pharmacy Patient Advocate Encounter   Received notification from RX Request Messages that prior authorization for [facility] is required/requested.   Insurance verification completed.   The patient is insured through [facility] .   Per test claim: Weight loss meds not covered by pt's plan

## 2023-10-25 NOTE — Telephone Encounter (Signed)
 Per test claim: The current 30 day co-pay is, $0.00.  No PA needed at this time. This test claim was processed through [facility]- copay amounts may vary at other pharmacies due to pharmacy/plan contracts, or as the patient moves through the different stages of their insurance plan.     Please sign off on rx in this encounter as PA team is unable to resolve RX requests. Thank you

## 2023-10-25 NOTE — Telephone Encounter (Signed)
 Per test claim, weight loss meds are not covered by pt's plan. Please sign off on rx in this encounter as PA team is unable to resolve RX requests. Thank you

## 2023-10-25 NOTE — Telephone Encounter (Signed)
 Pt's plan does not cover weight loss medications.  Please sign off on rx in this encounter as PA team is unable to resolve RX requests. Thank you

## 2023-10-25 NOTE — Telephone Encounter (Signed)
 Pharmacy Patient Advocate Encounter   Received notification from RX Request Messages that prior authorization for Buspirone 10mg  tabs is required/requested.   Insurance verification completed.   The patient is insured through [facility] .   Per test claim: The current 30 day co-pay is, $0.00.  No PA needed at this time. This test claim was processed through [facility]- copay amounts may vary at other pharmacies due to pharmacy/plan contracts, or as the patient moves through the different stages of their insurance plan.

## 2023-10-26 ENCOUNTER — Ambulatory Visit: Payer: Self-pay | Admitting: Urgent Care

## 2023-10-26 ENCOUNTER — Other Ambulatory Visit: Payer: Self-pay

## 2023-10-26 ENCOUNTER — Encounter
Admission: RE | Disposition: A | Discharge status: Home / Self Care | Payer: Self-pay | Source: Home / Self Care | Attending: Surgery

## 2023-10-26 ENCOUNTER — Ambulatory Visit
Admission: RE | Admit: 2023-10-26 | Discharge: 2023-10-26 | Disposition: A | Discharge status: Home / Self Care | Attending: Surgery | Admitting: Surgery

## 2023-10-26 ENCOUNTER — Encounter: Payer: Self-pay | Admitting: Surgery

## 2023-10-26 ENCOUNTER — Ambulatory Visit: Admitting: Certified Registered"

## 2023-10-26 DIAGNOSIS — F419 Anxiety disorder, unspecified: Secondary | ICD-10-CM | POA: Diagnosis not present

## 2023-10-26 DIAGNOSIS — K811 Chronic cholecystitis: Secondary | ICD-10-CM | POA: Diagnosis not present

## 2023-10-26 DIAGNOSIS — E66813 Obesity, class 3: Secondary | ICD-10-CM | POA: Insufficient documentation

## 2023-10-26 DIAGNOSIS — K828 Other specified diseases of gallbladder: Secondary | ICD-10-CM | POA: Diagnosis not present

## 2023-10-26 DIAGNOSIS — Z7951 Long term (current) use of inhaled steroids: Secondary | ICD-10-CM | POA: Insufficient documentation

## 2023-10-26 DIAGNOSIS — Z6841 Body Mass Index (BMI) 40.0 and over, adult: Secondary | ICD-10-CM | POA: Diagnosis not present

## 2023-10-26 DIAGNOSIS — F32A Depression, unspecified: Secondary | ICD-10-CM | POA: Diagnosis not present

## 2023-10-26 DIAGNOSIS — Z79899 Other long term (current) drug therapy: Secondary | ICD-10-CM | POA: Diagnosis not present

## 2023-10-26 DIAGNOSIS — K8020 Calculus of gallbladder without cholecystitis without obstruction: Secondary | ICD-10-CM | POA: Diagnosis not present

## 2023-10-26 DIAGNOSIS — J45909 Unspecified asthma, uncomplicated: Secondary | ICD-10-CM | POA: Insufficient documentation

## 2023-10-26 DIAGNOSIS — I10 Essential (primary) hypertension: Secondary | ICD-10-CM | POA: Diagnosis not present

## 2023-10-26 LAB — POCT PREGNANCY, URINE: Preg Test, Ur: NEGATIVE

## 2023-10-26 SURGERY — CHOLECYSTECTOMY, ROBOT-ASSISTED, LAPAROSCOPIC
Anesthesia: General

## 2023-10-26 MED ORDER — BUPIVACAINE LIPOSOME 1.3 % IJ SUSP
INTRAMUSCULAR | Status: DC | PRN
  Administered 2023-10-26: 10 mL

## 2023-10-26 MED ORDER — LIDOCAINE HCL (CARDIAC) PF 100 MG/5ML IV SOSY
PREFILLED_SYRINGE | INTRAVENOUS | Status: DC | PRN
  Administered 2023-10-26: 100 mg via INTRAVENOUS

## 2023-10-26 MED ORDER — PHENYLEPHRINE HCL-NACL 20-0.9 MG/250ML-% IV SOLN
INTRAVENOUS | Status: AC
  Filled 2023-10-26: qty 250

## 2023-10-26 MED ORDER — DROPERIDOL 2.5 MG/ML IJ SOLN
INTRAMUSCULAR | Status: AC
  Filled 2023-10-26: qty 2

## 2023-10-26 MED ORDER — OXYCODONE HCL 5 MG PO TABS
ORAL_TABLET | ORAL | Status: AC
  Filled 2023-10-26: qty 1

## 2023-10-26 MED ORDER — PHENYLEPHRINE HCL-NACL 20-0.9 MG/250ML-% IV SOLN
INTRAVENOUS | Status: DC | PRN
  Administered 2023-10-26: 30 ug/min via INTRAVENOUS

## 2023-10-26 MED ORDER — MIDAZOLAM HCL 2 MG/2ML IJ SOLN
INTRAMUSCULAR | Status: DC | PRN
  Administered 2023-10-26: 2 mg via INTRAVENOUS

## 2023-10-26 MED ORDER — FENTANYL CITRATE (PF) 100 MCG/2ML IJ SOLN
INTRAMUSCULAR | Status: AC
Start: 2023-10-26 — End: ?
  Filled 2023-10-26: qty 2

## 2023-10-26 MED ORDER — FENTANYL CITRATE (PF) 100 MCG/2ML IJ SOLN
INTRAMUSCULAR | Status: AC
  Filled 2023-10-26: qty 2

## 2023-10-26 MED ORDER — LIDOCAINE HCL (PF) 2 % IJ SOLN
INTRAMUSCULAR | Status: AC
  Filled 2023-10-26: qty 5

## 2023-10-26 MED ORDER — FENTANYL CITRATE (PF) 100 MCG/2ML IJ SOLN
25.0000 ug | INTRAMUSCULAR | Status: DC | PRN
  Administered 2023-10-26 (×3): 25 ug via INTRAVENOUS
  Administered 2023-10-26: 50 ug via INTRAVENOUS

## 2023-10-26 MED ORDER — ONDANSETRON HCL 4 MG/2ML IJ SOLN
INTRAMUSCULAR | Status: DC | PRN
  Administered 2023-10-26: 4 mg via INTRAVENOUS

## 2023-10-26 MED ORDER — IBUPROFEN 600 MG PO TABS: 600.0000 mg | ORAL_TABLET | Freq: Three times a day (TID) | ORAL | 1 refills | Status: DC | PRN

## 2023-10-26 MED ORDER — PROPOFOL 10 MG/ML IV BOLUS
INTRAVENOUS | Status: AC
  Filled 2023-10-26: qty 20

## 2023-10-26 MED ORDER — CHLORHEXIDINE GLUCONATE 0.12 % MT SOLN
OROMUCOSAL | Status: AC
  Filled 2023-10-26: qty 15

## 2023-10-26 MED ORDER — SUGAMMADEX SODIUM 200 MG/2ML IV SOLN
INTRAVENOUS | Status: DC | PRN
  Administered 2023-10-26: 400 mg via INTRAVENOUS

## 2023-10-26 MED ORDER — SUGAMMADEX SODIUM 200 MG/2ML IV SOLN
INTRAVENOUS | Status: AC
  Filled 2023-10-26: qty 2

## 2023-10-26 MED ORDER — MIDAZOLAM HCL 2 MG/2ML IJ SOLN
INTRAMUSCULAR | Status: AC
  Filled 2023-10-26: qty 2

## 2023-10-26 MED ORDER — GABAPENTIN 300 MG PO CAPS
ORAL_CAPSULE | ORAL | Status: AC
  Filled 2023-10-26: qty 1

## 2023-10-26 MED ORDER — PHENYLEPHRINE 80 MCG/ML (10ML) SYRINGE FOR IV PUSH (FOR BLOOD PRESSURE SUPPORT)
PREFILLED_SYRINGE | INTRAVENOUS | Status: DC | PRN
  Administered 2023-10-26: 160 ug via INTRAVENOUS
  Administered 2023-10-26 (×2): 80 ug via INTRAVENOUS

## 2023-10-26 MED ORDER — OXYCODONE HCL 5 MG PO TABS
5.0000 mg | ORAL_TABLET | Freq: Once | ORAL | Status: AC
  Administered 2023-10-26: 5 mg via ORAL

## 2023-10-26 MED ORDER — EPHEDRINE SULFATE-NACL 50-0.9 MG/10ML-% IV SOSY
PREFILLED_SYRINGE | INTRAVENOUS | Status: DC | PRN
  Administered 2023-10-26: 10 mg via INTRAVENOUS

## 2023-10-26 MED ORDER — KETOROLAC TROMETHAMINE 30 MG/ML IJ SOLN
INTRAMUSCULAR | Status: AC
  Filled 2023-10-26: qty 1

## 2023-10-26 MED ORDER — DEXAMETHASONE SODIUM PHOSPHATE 10 MG/ML IJ SOLN
INTRAMUSCULAR | Status: AC
  Filled 2023-10-26: qty 1

## 2023-10-26 MED ORDER — ONDANSETRON HCL 4 MG/2ML IJ SOLN
INTRAMUSCULAR | Status: AC
  Filled 2023-10-26: qty 2

## 2023-10-26 MED ORDER — DIPHENHYDRAMINE HCL 50 MG/ML IJ SOLN
INTRAMUSCULAR | Status: DC | PRN
Start: 2023-10-26 — End: 2023-10-26
  Administered 2023-10-26: 25 mg via INTRAVENOUS

## 2023-10-26 MED ORDER — CEFAZOLIN SODIUM-DEXTROSE 2-4 GM/100ML-% IV SOLN
INTRAVENOUS | Status: AC
  Filled 2023-10-26: qty 100

## 2023-10-26 MED ORDER — INDOCYANINE GREEN 25 MG IV SOLR
INTRAVENOUS | Status: AC
Start: 2023-10-26 — End: ?
  Filled 2023-10-26: qty 10

## 2023-10-26 MED ORDER — FENTANYL CITRATE (PF) 100 MCG/2ML IJ SOLN
INTRAMUSCULAR | Status: DC | PRN
  Administered 2023-10-26: 100 ug via INTRAVENOUS
  Administered 2023-10-26: 25 ug via INTRAVENOUS

## 2023-10-26 MED ORDER — ACETAMINOPHEN 500 MG PO TABS
ORAL_TABLET | ORAL | Status: AC
  Filled 2023-10-26: qty 2

## 2023-10-26 MED ORDER — BUPIVACAINE-EPINEPHRINE (PF) 0.25% -1:200000 IJ SOLN
INTRAMUSCULAR | Status: DC | PRN
  Administered 2023-10-26: 30 mL via PERINEURAL

## 2023-10-26 MED ORDER — DEXAMETHASONE SODIUM PHOSPHATE 10 MG/ML IJ SOLN
INTRAMUSCULAR | Status: DC | PRN
  Administered 2023-10-26: 10 mg via INTRAVENOUS

## 2023-10-26 MED ORDER — KETOROLAC TROMETHAMINE 30 MG/ML IJ SOLN
INTRAMUSCULAR | Status: DC | PRN
  Administered 2023-10-26: 30 mg via INTRAVENOUS

## 2023-10-26 MED ORDER — PROPOFOL 10 MG/ML IV BOLUS
INTRAVENOUS | Status: DC | PRN
  Administered 2023-10-26: 150 mg via INTRAVENOUS
  Administered 2023-10-26: 50 mg via INTRAVENOUS

## 2023-10-26 MED ORDER — ROCURONIUM BROMIDE 100 MG/10ML IV SOLN
INTRAVENOUS | Status: DC | PRN
  Administered 2023-10-26 (×2): 10 mg via INTRAVENOUS
  Administered 2023-10-26: 20 mg via INTRAVENOUS

## 2023-10-26 MED ORDER — SUCCINYLCHOLINE CHLORIDE 200 MG/10ML IV SOSY
PREFILLED_SYRINGE | INTRAVENOUS | Status: DC | PRN
  Administered 2023-10-26: 120 mg via INTRAVENOUS

## 2023-10-26 MED ORDER — ACETAMINOPHEN 500 MG PO TABS: 1000.0000 mg | ORAL_TABLET | Freq: Four times a day (QID) | ORAL | Status: DC | PRN

## 2023-10-26 MED ORDER — ROCURONIUM BROMIDE 10 MG/ML (PF) SYRINGE
PREFILLED_SYRINGE | INTRAVENOUS | Status: AC
  Filled 2023-10-26: qty 10

## 2023-10-26 MED ORDER — BUPIVACAINE-EPINEPHRINE (PF) 0.25% -1:200000 IJ SOLN
INTRAMUSCULAR | Status: AC
  Filled 2023-10-26: qty 30

## 2023-10-26 MED ORDER — OXYCODONE HCL 5 MG PO TABS: 5.0000 mg | ORAL_TABLET | ORAL | 0 refills | Status: DC | PRN

## 2023-10-26 MED ORDER — BUPIVACAINE LIPOSOME 1.3 % IJ SUSP
INTRAMUSCULAR | Status: AC
  Filled 2023-10-26: qty 20

## 2023-10-26 MED ORDER — PHENYLEPHRINE 80 MCG/ML (10ML) SYRINGE FOR IV PUSH (FOR BLOOD PRESSURE SUPPORT)
PREFILLED_SYRINGE | INTRAVENOUS | Status: AC
  Filled 2023-10-26: qty 10

## 2023-10-26 MED ORDER — EPHEDRINE 5 MG/ML INJ
INTRAVENOUS | Status: AC
  Filled 2023-10-26: qty 5

## 2023-10-26 MED ORDER — DROPERIDOL 2.5 MG/ML IJ SOLN
0.6250 mg | Freq: Once | INTRAMUSCULAR | Status: AC | PRN
  Administered 2023-10-26: 0.625 mg via INTRAVENOUS

## 2023-10-26 SURGICAL SUPPLY — 42 items
BAG PRESSURE INF REUSE 1000 (BAG) IMPLANT
CANNULA CAP OBTURATR AIRSEAL 8 (CAP) IMPLANT
CAUTERY HOOK MNPLR 1.6 DVNC XI (INSTRUMENTS) ×1 IMPLANT
CLIP LIGATING HEMO O LOK GREEN (MISCELLANEOUS) ×1 IMPLANT
DERMABOND ADVANCED .7 DNX12 (GAUZE/BANDAGES/DRESSINGS) ×1 IMPLANT
DRAPE ARM DVNC X/XI (DISPOSABLE) ×4 IMPLANT
DRAPE COLUMN DVNC XI (DISPOSABLE) ×1 IMPLANT
ELECT CAUTERY BLADE TIP 2.5 (TIP) ×1 IMPLANT
ELECT REM PT RETURN 9FT ADLT (ELECTROSURGICAL) ×1 IMPLANT
ELECTRODE CAUTERY BLDE TIP 2.5 (TIP) ×1 IMPLANT
ELECTRODE REM PT RTRN 9FT ADLT (ELECTROSURGICAL) ×1 IMPLANT
FORCEPS BPLR R/ABLATION 8 DVNC (INSTRUMENTS) ×1 IMPLANT
FORCEPS PROGRASP DVNC XI (FORCEP) ×1 IMPLANT
GLOVE SURG SYN 7.0 (GLOVE) ×4 IMPLANT
GLOVE SURG SYN 7.0 PF PI (GLOVE) ×2 IMPLANT
GLOVE SURG SYN 7.5 E (GLOVE) ×4 IMPLANT
GLOVE SURG SYN 7.5 PF PI (GLOVE) ×2 IMPLANT
GOWN STRL REUS W/ TWL LRG LVL3 (GOWN DISPOSABLE) ×4 IMPLANT
IRRIGATOR SUCT 8 DISP DVNC XI (IRRIGATION / IRRIGATOR) IMPLANT
IV NS 1000ML BAXH (IV SOLUTION) IMPLANT
KIT PINK PAD W/HEAD ARE REST (MISCELLANEOUS) ×1 IMPLANT
KIT PINK PAD W/HEAD ARM REST (MISCELLANEOUS) ×1 IMPLANT
LABEL OR SOLS (LABEL) ×1 IMPLANT
MANIFOLD NEPTUNE II (INSTRUMENTS) ×1 IMPLANT
NDL HYPO 22X1.5 SAFETY MO (MISCELLANEOUS) ×1 IMPLANT
NEEDLE HYPO 22X1.5 SAFETY MO (MISCELLANEOUS) ×1 IMPLANT
NS IRRIG 500ML POUR BTL (IV SOLUTION) ×1 IMPLANT
OBTURATOR OPTICAL STND 8 DVNC (TROCAR) ×1 IMPLANT
OBTURATOR OPTICALSTD 8 DVNC (TROCAR) ×1 IMPLANT
PACK LAP CHOLECYSTECTOMY (MISCELLANEOUS) ×1 IMPLANT
SEAL UNIV 5-12 XI (MISCELLANEOUS) ×4 IMPLANT
SET TUBE FILTERED XL AIRSEAL (SET/KITS/TRAYS/PACK) IMPLANT
SET TUBE SMOKE EVAC HIGH FLOW (TUBING) ×1 IMPLANT
SOL ELECTROSURG ANTI STICK (MISCELLANEOUS) ×1 IMPLANT
SOLUTION ELECTROSURG ANTI STCK (MISCELLANEOUS) ×1 IMPLANT
SPIKE FLUID TRANSFER (MISCELLANEOUS) ×1 IMPLANT
SUT MNCRL AB 4-0 PS2 18 (SUTURE) ×1 IMPLANT
SUT VIC AB 3-0 SH 27X BRD (SUTURE) IMPLANT
SUT VICRYL 0 UR6 27IN ABS (SUTURE) ×2 IMPLANT
SYS BAG RETRIEVAL 10MM (BASKET) ×1 IMPLANT
SYSTEM BAG RETRIEVAL 10MM (BASKET) ×1 IMPLANT
WATER STERILE IRR 500ML POUR (IV SOLUTION) ×1 IMPLANT

## 2023-10-26 NOTE — Transfer of Care (Signed)
 Immediate Anesthesia Transfer of Care Note  Patient: Amy Watson  Procedure(s) Performed: CHOLECYSTECTOMY, ROBOT-ASSISTED, LAPAROSCOPIC INDOCYANINE GREEN FLUORESCENCE IMAGING (ICG)  Patient Location: PACU  Anesthesia Type:General  Level of Consciousness: drowsy  Airway & Oxygen Therapy: Patient Spontanous Breathing and Patient connected to face mask oxygen  Post-op Assessment: Report given to RN and Post -op Vital signs reviewed and stable  Post vital signs: stable  Last Vitals:  Vitals Value Taken Time  BP 147/58 10/26/23 1115  Temp    Pulse 84 10/26/23 1119  Resp 23 10/26/23 1119  SpO2 97 % 10/26/23 1119  Vitals shown include unfiled device data.  Last Pain:  Vitals:   10/26/23 0814  TempSrc: Temporal  PainSc: 0-No pain         Complications: No notable events documented.

## 2023-10-26 NOTE — Anesthesia Procedure Notes (Signed)
 Procedure Name: Intubation Date/Time: 10/26/2023 9:36 AM  Performed by: Rodney Booze, CRNAPre-anesthesia Checklist: Patient identified, Emergency Drugs available, Suction available and Patient being monitored Patient Re-evaluated:Patient Re-evaluated prior to induction Oxygen Delivery Method: Circle system utilized Preoxygenation: Pre-oxygenation with 100% oxygen Induction Type: IV induction Ventilation: Mask ventilation without difficulty Laryngoscope Size: Miller and 2 Grade View: Grade II Tube type: Oral Tube size: 7.0 mm Number of attempts: 1 Airway Equipment and Method: Stylet and Oral airway Placement Confirmation: ETT inserted through vocal cords under direct vision, positive ETCO2 and breath sounds checked- equal and bilateral Secured at: 21 cm Tube secured with: Tape Dental Injury: Teeth and Oropharynx as per pre-operative assessment

## 2023-10-26 NOTE — Op Note (Addendum)
  Procedure Date:  10/26/2023  Pre-operative Diagnosis:  Symptomatic gallbladder sludge  Post-operative Diagnosis: Symptomatic gallbladder sludge  Procedure:  Robotic assisted cholecystectomy with ICG FireFly cholangiogram  Surgeon:  Howie Ill, MD  Anesthesia:  General endotracheal  Estimated Blood Loss:  15 ml  Specimens:  gallbladder  Complications:  None  Indications for Procedure:  This is a 53 y.o. female who presents with abdominal pain and workup revealing symptomatic gallbladder sludge.  Initially she did well with a low fat diet, but her symptoms have progressed and she presents for cholecystectomy.  The benefits, complications, treatment options, and expected outcomes were discussed with the patient. The risks of bleeding, infection, recurrence of symptoms, failure to resolve symptoms, bile duct damage, bile duct leak, retained common bile duct stone, bowel injury, and need for further procedures were all discussed with the patient and she was willing to proceed.  Description of Procedure: The patient was correctly identified in the preoperative area and brought into the operating room.  The patient was placed supine with VTE prophylaxis in place.  Appropriate time-outs were performed.  Anesthesia was induced and the patient was intubated.  Appropriate antibiotics were infused.  The abdomen was prepped and draped in a sterile fashion. An infraumbilical incision was made. A cutdown technique was used to enter the abdominal cavity without injury, and a 12 mm robotic port was inserted.  Pneumoperitoneum was obtained with appropriate opening pressures.  Three 8-mm ports were placed in the mid abdomen at the level of the umbilicus under direct visualization.  The DaVinci platform was docked, camera targeted, and instruments were placed under direct visualization.  The gallbladder was identified.  The fundus was grasped and retracted cephalad.  Adhesions were lysed bluntly and  with electrocautery. The infundibulum was grasped and retracted laterally, exposing the peritoneum overlying the gallbladder.  This was incised with electrocautery and extended on either side of the gallbladder.  FireFly cholangiogram was then obtained, and we were able to clearly identify the cystic duct and common bile duct.  The cystic duct and cystic artery were carefully dissected with combination of cautery and blunt dissection.  Both were clipped twice proximally and once distally, cutting in between.  The gallbladder was taken from the gallbladder fossa in a retrograde fashion with electrocautery. The gallbladder was placed in an Endocatch bag. The liver bed was inspected and any bleeding was controlled with electrocautery. The right upper quadrant was then inspected again revealing intact clips, no bleeding, and no ductal injury.  The 8 mm ports were removed under direct visualization and the 12 mm port was removed.  The Endocatch bag was brought out via the umbilical incision. The fascial opening was closed using 0 vicryl suture.  Local anesthetic was infused in all incisions and the incisions were closed with 4-0 Monocryl.  The wounds were cleaned and sealed with DermaBond.  The patient was emerged from anesthesia and extubated and brought to the recovery room for further management.  The patient tolerated the procedure well and all counts were correct at the end of the case.   Howie Ill, MD

## 2023-10-26 NOTE — Discharge Instructions (Signed)

## 2023-10-26 NOTE — Anesthesia Preprocedure Evaluation (Signed)
 Anesthesia Evaluation  Patient identified by MRN, date of birth, ID band Patient awake    Reviewed: Allergy & Precautions, H&P , NPO status , Patient's Chart, lab work & pertinent test results, reviewed documented beta blocker date and time   History of Anesthesia Complications (+) PONV and history of anesthetic complications  Airway Mallampati: I  TM Distance: >3 FB Neck ROM: full    Dental  (+) Dental Advidsory Given, Caps, Teeth Intact, Missing   Pulmonary neg shortness of breath, asthma , sleep apnea (being tested) , neg COPD, neg recent URI   Pulmonary exam normal breath sounds clear to auscultation       Cardiovascular Exercise Tolerance: Good hypertension, (-) angina (-) Past MI and (-) Cardiac Stents negative cardio ROS Normal cardiovascular exam(-) dysrhythmias (-) Valvular Problems/Murmurs Rhythm:regular Rate:Normal     Neuro/Psych  PSYCHIATRIC DISORDERS Anxiety Depression    negative neurological ROS     GI/Hepatic negative GI ROS, Neg liver ROS,,,  Endo/Other  neg diabetes  Class 3 obesity  Renal/GU negative Renal ROS  negative genitourinary   Musculoskeletal   Abdominal   Peds  Hematology negative hematology ROS (+)   Anesthesia Other Findings Past Medical History: No date: Allergy No date: Anxiety No date: Arthritis No date: Asthma No date: COVID-19 No date: Depression No date: Dyspnea 2025: Gallbladder sludge No date: Hypertension No date: Obesity No date: PONV (postoperative nausea and vomiting) No date: Pre-diabetes   Reproductive/Obstetrics negative OB ROS                             Anesthesia Physical Anesthesia Plan  ASA: 3  Anesthesia Plan: General   Post-op Pain Management:    Induction: Intravenous  PONV Risk Score and Plan: 4 or greater and Ondansetron, Dexamethasone, Midazolam and Treatment may vary due to age or medical condition  Airway  Management Planned: Oral ETT  Additional Equipment:   Intra-op Plan:   Post-operative Plan: Extubation in OR  Informed Consent: I have reviewed the patients History and Physical, chart, labs and discussed the procedure including the risks, benefits and alternatives for the proposed anesthesia with the patient or authorized representative who has indicated his/her understanding and acceptance.     Dental Advisory Given  Plan Discussed with: Anesthesiologist, CRNA and Surgeon  Anesthesia Plan Comments:         Anesthesia Quick Evaluation

## 2023-10-26 NOTE — Progress Notes (Signed)
 Notified patient daughter that she is doing well, but not ready to leave the recovery room at this time due to need of supplemental 02 and drowsiness. Up in a recliner at this time and incentive spirometer utilized.

## 2023-10-26 NOTE — Interval H&P Note (Signed)
 History and Physical Interval Note:  10/26/2023 9:25 AM  Amy Watson  has presented today for surgery, with the diagnosis of cholelithiasis.  The various methods of treatment have been discussed with the patient and family. After consideration of risks, benefits and other options for treatment, the patient has consented to  Procedure(s): CHOLECYSTECTOMY, ROBOT-ASSISTED, LAPAROSCOPIC (N/A) INDOCYANINE GREEN FLUORESCENCE IMAGING (ICG) (N/A) as a surgical intervention.  The patient's history has been reviewed, patient examined, no change in status, stable for surgery.  I have reviewed the patient's chart and labs.  Questions were answered to the patient's satisfaction.     Haliey Romberg

## 2023-10-26 NOTE — Progress Notes (Signed)
 Patient is maintaining on room air at 93-96%. Has been off of nasal cannula for about 20 minutes.

## 2023-10-27 LAB — SURGICAL PATHOLOGY

## 2023-10-27 NOTE — Anesthesia Postprocedure Evaluation (Signed)
 Anesthesia Post Note  Patient: Amy Watson  Procedure(s) Performed: CHOLECYSTECTOMY, ROBOT-ASSISTED, LAPAROSCOPIC INDOCYANINE GREEN FLUORESCENCE IMAGING (ICG)  Patient location during evaluation: PACU Anesthesia Type: General Level of consciousness: awake and alert Pain management: pain level controlled Vital Signs Assessment: post-procedure vital signs reviewed and stable Respiratory status: spontaneous breathing, nonlabored ventilation, respiratory function stable and patient connected to nasal cannula oxygen Cardiovascular status: blood pressure returned to baseline and stable Postop Assessment: no apparent nausea or vomiting Anesthetic complications: no   No notable events documented.   Last Vitals:  Vitals:   10/26/23 1353 10/26/23 1401  BP:  129/68  Pulse: 72 85  Resp: 20 20  Temp:  37.2 C  SpO2: 93% 94%    Last Pain:  Vitals:   10/26/23 1401  TempSrc: Temporal  PainSc: 0-No pain                 Lenard Simmer

## 2023-10-27 NOTE — Progress Notes (Signed)
 noted

## 2023-11-06 ENCOUNTER — Encounter (HOSPITAL_BASED_OUTPATIENT_CLINIC_OR_DEPARTMENT_OTHER): Payer: Self-pay | Admitting: Cardiovascular Disease

## 2023-11-06 ENCOUNTER — Encounter: Payer: Self-pay | Admitting: Family

## 2023-11-07 ENCOUNTER — Encounter: Payer: Self-pay | Admitting: Family

## 2023-11-07 ENCOUNTER — Ambulatory Visit (INDEPENDENT_AMBULATORY_CARE_PROVIDER_SITE_OTHER)
Admission: RE | Admit: 2023-11-07 | Discharge: 2023-11-07 | Disposition: A | Discharge status: Home / Self Care | Source: Ambulatory Visit | Attending: Family | Admitting: Family

## 2023-11-07 ENCOUNTER — Ambulatory Visit (INDEPENDENT_AMBULATORY_CARE_PROVIDER_SITE_OTHER): Admitting: Family

## 2023-11-07 VITALS — BP 126/84 | Temp 98.0°F | Ht 66.0 in | Wt 256.4 lb

## 2023-11-07 DIAGNOSIS — J309 Allergic rhinitis, unspecified: Secondary | ICD-10-CM

## 2023-11-07 DIAGNOSIS — S99921A Unspecified injury of right foot, initial encounter: Secondary | ICD-10-CM | POA: Diagnosis not present

## 2023-11-07 NOTE — Assessment & Plan Note (Signed)
 Fluid in ears on physical exam  Advised pt to restart zyrtec Could consider prednisone but for now will try otc

## 2023-11-07 NOTE — Assessment & Plan Note (Signed)
 Xray today pending results Discussed buddy taping, wearing rigid soles, rest ice elevation.

## 2023-11-07 NOTE — Progress Notes (Signed)
 Established Patient Office Visit  Subjective:   Patient ID: Amy Watson, female    DOB: January 19, 1971  Age: 53 y.o. MRN: [id number]  CC:  Chief Complaint  Patient presents with   Acute Visit    Possible ear infection and she bumped her toes on her cough table    HPI: Amy Watson is a 53 y.o. female presenting on 11/07/2023 for Acute Visit (Possible ear infection and she bumped her toes on her cough table)  Two nights ago stubbed her foot, and since she is noticing some pain in her third and fourth toe on the right side. Particularly at the base of the toe near her anterior foot. Worse with movement or if she tries to touch the fourth one. She is taking tylenol /ibuprofen prn. She is also on oxycodone as needed as she just had a cholecystectomy.   About two nights ago unrelated she started noticing bil ear pain and popping sensation in her ears. No sore throat or nasal congestion. No cough.   She is not taking any medications for allergies right now.      ROS: Negative unless specifically indicated above in HPI.   Relevant past medical history reviewed and updated as indicated.   Allergies and medications reviewed and updated.   Current Outpatient Medications:    acetaminophen (TYLENOL) 500 MG tablet, Take 2 tablets (1,000 mg total) by mouth every 6 (six) hours as needed for mild pain (pain score 1-3)., Disp: , Rfl:    amLODipine (NORVASC) 10 MG tablet, Take 1 tablet (10 mg total) by mouth daily., Disp: 90 tablet, Rfl: 3   B Complex Vitamins (B COMPLEX PO), Take 1 tablet by mouth daily., Disp: , Rfl:    busPIRone (BUSPAR) 10 MG tablet, Take 10 mg by mouth 2 (two) times daily., Disp: , Rfl:    Calcium Carbonate+Vitamin D (CALCIUM 600+D3) 600-200 MG-UNIT TABS, Take 1 tablet by mouth in the morning and at bedtime., Disp: 180 tablet, Rfl: 3   cetirizine (ZYRTEC) 10 MG tablet, Take 10 mg by mouth daily as needed for allergies., Disp: , Rfl:    Cholecalciferol (VITAMIN D3)  1.25 MG (50000 UT) CAPS, TAKE 1 TABLET BY MOUTH ONCE A WEEK., Disp: 4 capsule, Rfl: 0   escitalopram (LEXAPRO) 20 MG tablet, TAKE 1 TABLET BY MOUTH EVERY DAY, Disp: 90 tablet, Rfl: 0   fluticasone furoate-vilanterol (BREO ELLIPTA) 200-25 MCG/ACT AEPB, Inhale 1 puff into the lungs daily., Disp: , Rfl:    [name] 24 FE 1-20 MG-MCG(24) tablet, TAKE 1 TABLET BY MOUTH EVERY DAY, Disp: 84 tablet, Rfl: 3   hydrochlorothiazide (HYDRODIURIL) 25 MG tablet, Take 1 tablet (25 mg total) by mouth daily., Disp: 90 tablet, Rfl: 3   ibuprofen (ADVIL) 600 MG tablet, Take 1 tablet (600 mg total) by mouth every 8 (eight) hours as needed for moderate pain (pain score 4-6)., Disp: 60 tablet, Rfl: 1   montelukast (SINGULAIR) 10 MG tablet, Take 10 mg by mouth at bedtime as needed (allergies)., Disp: , Rfl:    oxyCODONE (OXY IR/ROXICODONE) 5 MG immediate release tablet, Take 1 tablet (5 mg total) by mouth every 4 (four) hours as needed for severe pain (pain score 7-10)., Disp: 30 tablet, Rfl: 0   PROAIR HFA 108 (90 Base) MCG/ACT inhaler, Inhale 1-2 puffs into the lungs every 4 (four) hours as needed for wheezing or shortness of breath., Disp: , Rfl:    valsartan (DIOVAN) 320 MG tablet, Take 1 tablet (320 mg  total) by mouth daily., Disp: 90 tablet, Rfl: 0  Allergies  Allergen Reactions   Hydrochlorothiazide Other (See Comments)    Dehydration    Prozac [Fluoxetine] Other (See Comments)    fatigue   Azithromycin Other (See Comments)    C. Diff./  Pain     Objective:   BP 126/84 (BP Location: Left Arm, Patient Position: Sitting, Cuff Size: Large)   Temp 98 F (36.7 C) (Oral)   Ht 5\' 6"  (1.676 m)   Wt 256 lb 6.4 oz (116.3 kg)   BMI 41.38 kg/m    Physical Exam Vitals reviewed.  Constitutional:      General: She is not in acute distress.    Appearance: Normal appearance. She is normal weight. She is not ill-appearing, toxic-appearing or diaphoretic.  HENT:     Head: Normocephalic.     Right Ear: A middle ear  effusion (clear) is present.     Left Ear: A middle ear effusion (clear) is present.     Nose: Rhinorrhea present.     Mouth/Throat:     Mouth: Mucous membranes are dry.     Pharynx: Postnasal drip present. No oropharyngeal exudate or posterior oropharyngeal erythema.  Eyes:     Extraocular Movements: Extraocular movements intact.     Pupils: Pupils are equal, round, and reactive to light.  Cardiovascular:     Rate and Rhythm: Normal rate and regular rhythm.     Pulses: Normal pulses.     Heart sounds: Normal heart sounds.  Pulmonary:     Effort: Pulmonary effort is normal.     Breath sounds: Normal breath sounds.  Musculoskeletal:        General: Normal range of motion.     Cervical back: Normal range of motion.  Feet:     Comments: Right 3rd and fourth metatarsal with slight edema and tenderness upon palpation at proximal aspect. Painful flexion, limited due to pain.  Neurological:     General: No focal deficit present.     Mental Status: She is alert and oriented to person, place, and time. Mental status is at baseline.  Psychiatric:        Mood and Affect: Mood normal.        Behavior: Behavior normal.        Thought Content: Thought content normal.        Judgment: Judgment normal.     Assessment & Plan:  Injury of right toe, initial encounter Assessment & Plan: Xray today pending results Discussed buddy taping, wearing rigid soles, rest ice elevation.    Orders: -     DG Foot Complete Right  Allergic rhinitis, unspecified seasonality, unspecified trigger Assessment & Plan: Fluid in ears on physical exam  Advised pt to restart zyrtec Could consider prednisone but for now will try otc       Follow up plan: Return if symptoms worsen or fail to improve.  Mort Sawyers, FNP

## 2023-11-08 ENCOUNTER — Ambulatory Visit (INDEPENDENT_AMBULATORY_CARE_PROVIDER_SITE_OTHER): Admitting: Surgery

## 2023-11-08 ENCOUNTER — Encounter: Payer: Self-pay | Admitting: Surgery

## 2023-11-08 ENCOUNTER — Encounter: Admitting: Physician Assistant

## 2023-11-08 VITALS — BP 115/75 | HR 90 | Ht 66.0 in | Wt 255.0 lb

## 2023-11-08 DIAGNOSIS — K828 Other specified diseases of gallbladder: Secondary | ICD-10-CM

## 2023-11-08 DIAGNOSIS — Z09 Encounter for follow-up examination after completed treatment for conditions other than malignant neoplasm: Secondary | ICD-10-CM

## 2023-11-08 MED ORDER — OMEPRAZOLE 20 MG PO CPDR: 20.0000 mg | DELAYED_RELEASE_CAPSULE | Freq: Every day | ORAL | 1 refills | Status: DC

## 2023-11-08 NOTE — Patient Instructions (Addendum)
 We have sent in a prescription for Prilosec to see if this helps with your upper abdominal pain and bloating. Take this once a day first thing in the morning.  Follow up here in 2 weeks.   GENERAL POST-OPERATIVE PATIENT INSTRUCTIONS   WOUND CARE INSTRUCTIONS:  Keep a dry clean dressing on the wound if there is drainage. The initial bandage may be removed after 24 hours.  Once the wound has quit draining you may leave it open to air.  If clothing rubs against the wound or causes irritation and the wound is not draining you may cover it with a dry dressing during the daytime.  Try to keep the wound dry and avoid ointments on the wound unless directed to do so.  If the wound becomes bright red and painful or starts to drain infected material that is not clear, please contact your physician immediately.  If the wound is mildly pink and has a thick firm ridge underneath it, this is normal, and is referred to as a healing ridge.  This will resolve over the next 4-6 weeks.  BATHING: You may shower if you have been informed of this by your surgeon. However, Please do not submerge in a tub, hot tub, or pool until incisions are completely sealed or have been told by your surgeon that you may do so.  DIET:  You may eat any foods that you can tolerate.  It is a good idea to eat a high fiber diet and take in plenty of fluids to prevent constipation.  If you do become constipated you may want to take a mild laxative or take ducolax tablets on a daily basis until your bowel habits are regular.  Constipation can be very uncomfortable, along with straining, after recent surgery.  ACTIVITY:  You are encouraged to cough and deep breath or use your incentive spirometer if you were given one, every 15-30 minutes when awake.  This will help prevent respiratory complications and low grade fevers post-operatively if you had a general anesthetic.  You may want to hug a pillow when coughing and sneezing to add additional  support to the surgical area, if you had abdominal or chest surgery, which will decrease pain during these times.  You are encouraged to walk and engage in light activity for the next two weeks.  You should not lift more than 20 pounds for 6 weeks total after surgery as it could put you at increased risk for complications.  Twenty pounds is roughly equivalent to a plastic bag of groceries. At that time- Listen to your body when lifting, if you have pain when lifting, stop and then try again in a few days. Soreness after doing exercises or activities of daily living is normal as you get back in to your normal routine.  MEDICATIONS:  Try to take narcotic medications and anti-inflammatory medications, such as tylenol, ibuprofen, naprosyn, etc., with food.  This will minimize stomach upset from the medication.  Should you develop nausea and vomiting from the pain medication, or develop a rash, please discontinue the medication and contact your physician.  You should not drive, make important decisions, or operate machinery when taking narcotic pain medication.  SUNBLOCK Use sun block to incision area over the next year if this area will be exposed to sun. This helps decrease scarring and will allow you avoid a permanent darkened area over your incision.  QUESTIONS:  Please feel free to call our office if you have any questions, and  we will be glad to assist you. [phone]

## 2023-11-08 NOTE — Progress Notes (Signed)
 11/08/2023  HPI: Amy Watson is a 53 y.o. female s/p robotic assisted cholecystectomy on 10/26/2023.  Patient presents today for follow-up.  Patient reports a few issues that are still ongoing.  She reports some bloatedness and nausea and discomfort in the upper abdomen and diarrhea.  She reports discomfort in the epigastric area that is positional in nature.  She has been taking ibuprofen and oxycodone for pain control.  She reports that the nausea and diarrhea are happening more depending on the food that she eats.  Denies any blood in her stool, fevers, chills.  Incisions themselves are doing well.  Vital signs: BP 115/75   Pulse 90   Ht 5\' 6"  (1.676 m)   Wt 255 lb (115.7 kg)   SpO2 99%   BMI 41.16 kg/m    Physical Exam: Constitutional: No acute distress Abdomen: Soft, nondistended, with some tenderness to palpation in the epigastric area.  The incisions are healing well and are clean, dry, intact without any significant tenderness.  Assessment/Plan: This is a 53 y.o. female s/p robotic assisted cholecystectomy.  - Discussed with patient that the epigastric pain could be relationship to any gastritis or GERD that could be happening as a result of the increased ibuprofen use postoperatively.  The other symptoms may be more related to her body still not being fully adjusted to not having a gallbladder.  Discussed with her that for now we can do a trial of Prilosec 20 mg once daily to take for 2 weeks to see if this improves the epigastric pain.  Also recommended that she stay on a low-fat diet in the meantime while her body is adjusting to not having her gallbladder. - Patient will follow-up with me in 2 weeks to reassess her symptoms.  If the epigastric pain is still ongoing, we may need to do further imaging to evaluate better.  If her GI symptoms still persist also, we may have to start her on cholestyramine.  Howie Ill, MD [facility]

## 2023-11-10 ENCOUNTER — Other Ambulatory Visit: Payer: Self-pay | Admitting: Surgery

## 2023-11-13 ENCOUNTER — Other Ambulatory Visit: Payer: Self-pay

## 2023-11-13 ENCOUNTER — Encounter: Payer: Self-pay | Admitting: Family

## 2023-11-13 ENCOUNTER — Encounter: Payer: Self-pay | Admitting: Surgery

## 2023-11-13 DIAGNOSIS — G8929 Other chronic pain: Secondary | ICD-10-CM

## 2023-11-14 DIAGNOSIS — Z5181 Encounter for therapeutic drug level monitoring: Secondary | ICD-10-CM | POA: Diagnosis not present

## 2023-11-15 LAB — BASIC METABOLIC PANEL WITH GFR
BUN/Creatinine Ratio: 19 (ref 9–23)
BUN: 10 mg/dL (ref 6–24)
CO2: 22 mmol/L (ref 20–29)
Calcium: 9.2 mg/dL (ref 8.7–10.2)
Chloride: 102 mmol/L (ref 96–106)
Creatinine, Ser: 0.53 mg/dL — ABNORMAL LOW (ref 0.57–1.00)
Glucose: 115 mg/dL — ABNORMAL HIGH (ref 70–99)
Potassium: 3.7 mmol/L (ref 3.5–5.2)
Sodium: 139 mmol/L (ref 134–144)
eGFR: 110 mL/min/{1.73_m2} (ref >59)

## 2023-11-16 ENCOUNTER — Ambulatory Visit
Admission: RE | Admit: 2023-11-16 | Discharge: 2023-11-16 | Disposition: A | Discharge status: Home / Self Care | Source: Ambulatory Visit | Attending: Surgery | Admitting: Surgery

## 2023-11-16 DIAGNOSIS — G8929 Other chronic pain: Secondary | ICD-10-CM | POA: Insufficient documentation

## 2023-11-16 DIAGNOSIS — R1011 Right upper quadrant pain: Secondary | ICD-10-CM | POA: Insufficient documentation

## 2023-11-16 DIAGNOSIS — K760 Fatty (change of) liver, not elsewhere classified: Secondary | ICD-10-CM | POA: Diagnosis not present

## 2023-11-17 DIAGNOSIS — G4730 Sleep apnea, unspecified: Secondary | ICD-10-CM | POA: Diagnosis not present

## 2023-11-20 ENCOUNTER — Encounter: Payer: Self-pay | Admitting: Surgery

## 2023-11-20 ENCOUNTER — Ambulatory Visit (INDEPENDENT_AMBULATORY_CARE_PROVIDER_SITE_OTHER): Admitting: Surgery

## 2023-11-20 ENCOUNTER — Encounter: Payer: Self-pay | Admitting: Family

## 2023-11-20 ENCOUNTER — Other Ambulatory Visit: Payer: Self-pay | Admitting: *Deleted

## 2023-11-20 VITALS — BP 154/81 | HR 101 | Temp 98.3°F | Ht 66.0 in | Wt 261.0 lb

## 2023-11-20 DIAGNOSIS — K828 Other specified diseases of gallbladder: Secondary | ICD-10-CM

## 2023-11-20 DIAGNOSIS — Z09 Encounter for follow-up examination after completed treatment for conditions other than malignant neoplasm: Secondary | ICD-10-CM

## 2023-11-20 MED ORDER — CHOLESTYRAMINE 4 G PO PACK: 4.0000 g | PACK | Freq: Three times a day (TID) | ORAL | 0 refills | Status: DC

## 2023-11-20 NOTE — Progress Notes (Signed)
 11/20/2023  HPI: Amy Watson is a 53 y.o. female s/p robotic assisted cholecystectomy on 10/26/23.  She presents today for follow up.  She was last seen on 11/08/23 at which time she was reporting epigastric abdominal pain, bloatedness, nausea, and diarrhea.  She had an ultrasound of the RUQ on 11/16/23 which did not reveal any pathology or post-op complications.  It was thought that her body had not adjusted well yet to not having a gallbladder.  Today, the patient reports that the epigastric pain is easing up finally over the last few days.  She still gets some bloatedness, and her diarrhea is better.  She's been trying some foods to ease into, but her symptoms have not fully resolved yet, although there is some improvement.  Vital signs: BP (!) 154/81   Pulse (!) 101   Temp 98.3 F (36.8 C) (Oral)   Ht 5\' 6"  (1.676 m)   Wt 261 lb (118.4 kg)   SpO2 96%   BMI 42.13 kg/m    Physical Exam: Constitutional:  No acute distress Abdomen:  soft, non-distended, with mild discomfort in the epigastric area, certainly better compared to her last exam.  Incisions are healing well and are clean, dry, intact.  Assessment/Plan: This is a 53 y.o. female s/p robotic cholecystectomy.  --Discussed with the patient that this may truly be post-cholecystectomy issues as her body is still adjusting.  Reassured that there has been an improvement in her symptoms since her last visit.  Discussed with her that we could continue monitoring the symptoms or possibly start her on cholestyramine to help with bile absorption.  She has opted to start cholestyramine as a precaution to continue helping. --Will provide prescription for cholestyramine, discussed dosage and adjustment of dosage.  Will follow up with her in about 3 weeks to assess her progress.   Howie Ill, MD [facility]

## 2023-11-20 NOTE — Addendum Note (Signed)
 Addended by: Myrtie Hawk on: 11/20/2023 04:51 PM   Modules accepted: Orders

## 2023-11-20 NOTE — Telephone Encounter (Signed)
 Printed results placed in your box for review.

## 2023-11-20 NOTE — Patient Instructions (Signed)

## 2023-11-22 ENCOUNTER — Encounter: Payer: Self-pay | Admitting: Family

## 2023-11-22 ENCOUNTER — Encounter: Admitting: Surgery

## 2023-11-22 DIAGNOSIS — L570 Actinic keratosis: Secondary | ICD-10-CM | POA: Diagnosis not present

## 2023-11-22 DIAGNOSIS — Z872 Personal history of diseases of the skin and subcutaneous tissue: Secondary | ICD-10-CM | POA: Diagnosis not present

## 2023-11-22 DIAGNOSIS — L821 Other seborrheic keratosis: Secondary | ICD-10-CM | POA: Diagnosis not present

## 2023-11-22 DIAGNOSIS — L578 Other skin changes due to chronic exposure to nonionizing radiation: Secondary | ICD-10-CM | POA: Diagnosis not present

## 2023-11-22 NOTE — Telephone Encounter (Signed)
 Have pt schedule a video visit to discuss her pap results, insurance requires face to face. I have received results.

## 2023-11-22 NOTE — Telephone Encounter (Signed)
 [email or web address]

## 2023-11-23 NOTE — Telephone Encounter (Signed)
 Do you know anything about this lindsay?

## 2023-11-27 DIAGNOSIS — M79671 Pain in right foot: Secondary | ICD-10-CM | POA: Diagnosis not present

## 2023-11-27 DIAGNOSIS — S86319A Strain of muscle(s) and tendon(s) of peroneal muscle group at lower leg level, unspecified leg, initial encounter: Secondary | ICD-10-CM | POA: Insufficient documentation

## 2023-11-27 DIAGNOSIS — M25571 Pain in right ankle and joints of right foot: Secondary | ICD-10-CM | POA: Diagnosis not present

## 2023-11-27 DIAGNOSIS — S86311A Strain of muscle(s) and tendon(s) of peroneal muscle group at lower leg level, right leg, initial encounter: Secondary | ICD-10-CM | POA: Diagnosis not present

## 2023-11-29 ENCOUNTER — Encounter (HOSPITAL_BASED_OUTPATIENT_CLINIC_OR_DEPARTMENT_OTHER): Payer: Self-pay | Admitting: Cardiovascular Disease

## 2023-11-30 ENCOUNTER — Other Ambulatory Visit: Payer: Self-pay | Admitting: Family

## 2023-11-30 DIAGNOSIS — F4323 Adjustment disorder with mixed anxiety and depressed mood: Secondary | ICD-10-CM

## 2023-11-30 DIAGNOSIS — I10 Essential (primary) hypertension: Secondary | ICD-10-CM

## 2023-12-04 ENCOUNTER — Telehealth (INDEPENDENT_AMBULATORY_CARE_PROVIDER_SITE_OTHER): Admitting: Family

## 2023-12-04 DIAGNOSIS — G4733 Obstructive sleep apnea (adult) (pediatric): Secondary | ICD-10-CM

## 2023-12-04 DIAGNOSIS — Z9889 Other specified postprocedural states: Secondary | ICD-10-CM | POA: Insufficient documentation

## 2023-12-04 NOTE — Progress Notes (Signed)
 Virtual Visit via Video note  I connected with Holle L Sterling on 12/05/23 at home by video and verified that I am speaking with the correct person using two identifiers.The provider, Felicita Horns, FNP is located in their office at time of visit.  I discussed the limitations, risks, security and privacy concerns of performing an evaluation and management service by video and the availability of in person appointments. I also discussed with the patient that there may be a patient responsible charge related to this service. The patient expressed understanding and agreed to proceed.  Subjective: PCP: Felicita Horns, FNP  Chief Complaint  Patient presents with   Medical Management of Chronic Issues    HPI  Sleep study results:  Moderate sleep obstructive apnea. Pt not currently on CPAP, results indicate need for pap therapy. Total number apneas 23, Obstructive apneas 22, hypopneas 83, lowest O2 79%, total 73 desats.   Pt with obesity. Has been trying to work with a Systems analyst, working on [employer] a few times weekly. Also trying to watch diet and portion control.        ROS: Per HPI  Current Outpatient Medications:    acetaminophen  (TYLENOL ) 500 MG tablet, Take 2 tablets (1,000 mg total) by mouth every 6 (six) hours as needed for mild pain (pain score 1-3)., Disp: , Rfl:    amLODipine  (NORVASC ) 10 MG tablet, Take 1 tablet (10 mg total) by mouth daily., Disp: 90 tablet, Rfl: 3   B Complex Vitamins (B COMPLEX PO), Take 1 tablet by mouth daily., Disp: , Rfl:    busPIRone  (BUSPAR ) 10 MG tablet, Take 10 mg by mouth 2 (two) times daily., Disp: , Rfl:    Calcium  Carbonate+Vitamin D  (CALCIUM  600+D3) 600-200 MG-UNIT TABS, Take 1 tablet by mouth in the morning and at bedtime., Disp: 180 tablet, Rfl: 3   cetirizine (ZYRTEC) 10 MG tablet, Take 10 mg by mouth daily as needed for allergies., Disp: , Rfl:    Cholecalciferol  (VITAMIN D3) 1.25 MG (50000 UT) CAPS, TAKE 1 TABLET BY MOUTH ONCE  A WEEK., Disp: 4 capsule, Rfl: 0   escitalopram  (LEXAPRO ) 20 MG tablet, TAKE 1 TABLET BY MOUTH EVERY DAY, Disp: 90 tablet, Rfl: 3   fluticasone furoate-vilanterol (BREO ELLIPTA) 200-25 MCG/ACT AEPB, Inhale 1 puff into the lungs daily., Disp: , Rfl:    [name]  24 FE 1-20 MG-MCG(24) tablet, TAKE 1 TABLET BY MOUTH EVERY DAY, Disp: 84 tablet, Rfl: 3   hydrochlorothiazide  (HYDRODIURIL ) 25 MG tablet, Take 1 tablet (25 mg total) by mouth daily., Disp: 90 tablet, Rfl: 3   ibuprofen  (ADVIL ) 600 MG tablet, Take 1 tablet (600 mg total) by mouth every 8 (eight) hours as needed for moderate pain (pain score 4-6)., Disp: 60 tablet, Rfl: 1   montelukast (SINGULAIR) 10 MG tablet, Take 10 mg by mouth at bedtime as needed (allergies)., Disp: , Rfl:    omeprazole  (PRILOSEC ) 20 MG capsule, Take 1 capsule (20 mg total) by mouth daily., Disp: 30 capsule, Rfl: 1   PROAIR HFA 108 (90 Base) MCG/ACT inhaler, Inhale 1-2 puffs into the lungs every 4 (four) hours as needed for wheezing or shortness of breath., Disp: , Rfl:    valsartan  (DIOVAN ) 320 MG tablet, TAKE 1 TABLET BY MOUTH EVERY DAY, Disp: 90 tablet, Rfl: 3  Observations/Objective: Physical Exam Constitutional:      General: She is not in acute distress.    Appearance: Normal appearance. She is not ill-appearing.  Pulmonary:     Effort: Pulmonary  effort is normal.  Neurological:     General: No focal deficit present.     Mental Status: She is alert and oriented to person, place, and time.  Psychiatric:        Mood and Affect: Mood normal.        Behavior: Behavior normal.        Thought Content: Thought content normal.     Assessment and Plan: Moderate obstructive sleep apnea Assessment & Plan: Advised to start pap therapy, pt is on board with this.  Weight management is helpful with improving apnea severity and this is encouraged.  Pt advised to f/u within 90 days of starting PAP therapy to discuss compliance and overall symptoms, if any improvement.       Follow Up Instructions: Return in about 3 months (around 03/04/2024) for follow up OSA.   I discussed the assessment and treatment plan with the patient. The patient was provided an opportunity to ask questions and all were answered. The patient agreed with the plan and demonstrated an understanding of the instructions.   The patient was advised to call back or seek an in-person evaluation if the symptoms worsen or if the condition fails to improve as anticipated.  The above assessment and management plan was discussed with the patient. The patient verbalized understanding of and has agreed to the management plan. Patient is aware to call the clinic if symptoms persist or worsen. Patient is aware when to return to the clinic for a follow-up visit. Patient educated on when it is appropriate to go to the emergency department.     Felicita Horns, MSN, APRN, FNP-C [facility]

## 2023-12-05 ENCOUNTER — Encounter: Payer: Self-pay | Admitting: Family

## 2023-12-05 DIAGNOSIS — G4733 Obstructive sleep apnea (adult) (pediatric): Secondary | ICD-10-CM

## 2023-12-05 DIAGNOSIS — G4719 Other hypersomnia: Secondary | ICD-10-CM

## 2023-12-05 NOTE — Assessment & Plan Note (Signed)
 Advised to start pap therapy, pt is on board with this.  Weight management is helpful with improving apnea severity and this is encouraged.  Pt advised to f/u within 90 days of starting PAP therapy to discuss compliance and overall symptoms, if any improvement.

## 2023-12-11 ENCOUNTER — Encounter: Payer: Self-pay | Admitting: Surgery

## 2023-12-11 ENCOUNTER — Ambulatory Visit (INDEPENDENT_AMBULATORY_CARE_PROVIDER_SITE_OTHER): Admitting: Surgery

## 2023-12-11 VITALS — BP 140/78 | HR 90 | Temp 98.8°F | Ht 66.0 in | Wt 261.4 lb

## 2023-12-11 DIAGNOSIS — K828 Other specified diseases of gallbladder: Secondary | ICD-10-CM

## 2023-12-11 DIAGNOSIS — Z09 Encounter for follow-up examination after completed treatment for conditions other than malignant neoplasm: Secondary | ICD-10-CM

## 2023-12-11 NOTE — Telephone Encounter (Signed)
 [employer] - we will fill the form out like we have been doing. [employer] - we will place an order and I will fax it in.

## 2023-12-11 NOTE — Addendum Note (Signed)
 Addended by: Dewanda Foots C on: 12/11/2023 03:38 PM   Modules accepted: Orders

## 2023-12-11 NOTE — Telephone Encounter (Signed)
 Pt called asking for update on ppw?

## 2023-12-11 NOTE — Telephone Encounter (Signed)
Ppwk picked up

## 2023-12-11 NOTE — Progress Notes (Signed)
 12/11/2023  HPI: Amy Watson is a 53 y.o. female s/p robotic cholecystectomy on 10/26/2023.  Patient presents today for follow-up.  She was last seen in the office on 11/20/2023 at which time she reported that her symptoms were improving with improving epigastric pain and diarrhea.  We had discussed the possibility of trying cholestyramine  and she opted for trialing this.  She reports that she was taking it at first and went out of town and forgot to bring it with her.  She didn't take it for a few days but did not notice anything worsening.  She is now taking it again about once a day and things have been stable.  Now denies any abdominal pain, bloatedness, or other issues.  Vital signs: BP (!) 140/78   Pulse 90   Temp 98.8 F (37.1 C) (Oral)   Ht 5\' 6"  (1.676 m)   Wt 261 lb 6.4 oz (118.6 kg)   SpO2 97%   BMI 42.19 kg/m    Physical Exam: Constitutional: No acute distress Abdomen: Soft, nondistended, nontender to palpation.  Incisions are healing well and are clean, dry, intact.  Assessment/Plan: This is a 53 y.o. female s/p robotic assisted cholecystectomy  - Discussed with the patient the most likely again the symptoms that she was experiencing were related to her body not adjusting fully to not having a gallbladder.  I think now probably her body is now more adjusted which is why the cholestyramine  has not really had any effect as far as her taking it or not taking it.  I discussed with her that I think it is okay to try it continued this week and then she can taper off next week. - Has now been 6 weeks from her surgery.  There is no activity restrictions any further. - Return precautions given.  Otherwise follow-up as needed.   Marene Shape, MD [facility]

## 2023-12-11 NOTE — Telephone Encounter (Signed)
 Yes this is fine however we've been using [employer] and I haven't had to send an order before I think we just faxed the orders prior?  Or is this something I'll have to start doing moving forward?

## 2023-12-11 NOTE — Patient Instructions (Signed)
 Please call the office if you have any questions or concerns.    GENERAL POST-OPERATIVE PATIENT INSTRUCTIONS   WOUND CARE INSTRUCTIONS:  Keep a dry clean dressing on the wound if there is drainage. The initial bandage may be removed after 24 hours.  Once the wound has quit draining you may leave it open to air.  If clothing rubs against the wound or causes irritation and the wound is not draining you may cover it with a dry dressing during the daytime.  Try to keep the wound dry and avoid ointments on the wound unless directed to do so.  If the wound becomes bright red and painful or starts to drain infected material that is not clear, please contact your physician immediately.  If the wound is mildly pink and has a thick firm ridge underneath it, this is normal, and is referred to as a healing ridge.  This will resolve over the next 4-6 weeks.  BATHING: You may shower if you have been informed of this by your surgeon. However, Please do not submerge in a tub, hot tub, or pool until incisions are completely sealed or have been told by your surgeon that you may do so.  DIET:  You may eat any foods that you can tolerate.  It is a good idea to eat a high fiber diet and take in plenty of fluids to prevent constipation.  If you do become constipated you may want to take a mild laxative or take ducolax tablets on a daily basis until your bowel habits are regular.  Constipation can be very uncomfortable, along with straining, after recent surgery.  ACTIVITY:  You are encouraged to cough and deep breath or use your incentive spirometer if you were given one, every 15-30 minutes when awake.  This will help prevent respiratory complications and low grade fevers post-operatively if you had a general anesthetic.  You may want to hug a pillow when coughing and sneezing to add additional support to the surgical area, if you had abdominal or chest surgery, which will decrease pain during these times.  You are  encouraged to walk and engage in light activity for the next two weeks.  You should not lift more than 20 pounds for 6 weeks total after surgery as it could put you at increased risk for complications.  Twenty pounds is roughly equivalent to a plastic bag of groceries. At that time- Listen to your body when lifting, if you have pain when lifting, stop and then try again in a few days. Soreness after doing exercises or activities of daily living is normal as you get back in to your normal routine.  MEDICATIONS:  Try to take narcotic medications and anti-inflammatory medications, such as tylenol , ibuprofen , naprosyn, etc., with food.  This will minimize stomach upset from the medication.  Should you develop nausea and vomiting from the pain medication, or develop a rash, please discontinue the medication and contact your physician.  You should not drive, make important decisions, or operate machinery when taking narcotic pain medication.  SUNBLOCK Use sun block to incision area over the next year if this area will be exposed to sun. This helps decrease scarring and will allow you avoid a permanent darkened area over your incision.  QUESTIONS:  Please feel free to call our office if you have any questions, and we will be glad to assist you. [phone]

## 2023-12-12 DIAGNOSIS — Z9889 Other specified postprocedural states: Secondary | ICD-10-CM | POA: Diagnosis not present

## 2023-12-12 LAB — GENECONNECT MOLECULAR SCREEN

## 2023-12-13 ENCOUNTER — Encounter: Payer: Self-pay | Admitting: Family

## 2023-12-14 ENCOUNTER — Telehealth: Payer: Self-pay | Admitting: Medical Genetics

## 2023-12-14 NOTE — Progress Notes (Signed)
 [facility]  12/14/23 1:31 PM  Confirmed I was speaking with Amy Watson [phone] by using name and DOB. Informed participant the reason for this call is to follow-up on a recent blood sample the participant provided at one of the [facility] lab locations. Informed participant the test was not able to be completed with this sample and apologized for the inconvenience. Participant was requested to provide a new sample at one of our participating labs at no cost so that participant can continue participation and receive test results. Participant agreed to provide another sample. Participant was provided the [employer] program website to learn why this may have happened. Participant was thanked for their time and continued support of the above study.

## 2023-12-16 DIAGNOSIS — G4733 Obstructive sleep apnea (adult) (pediatric): Secondary | ICD-10-CM | POA: Diagnosis not present

## 2023-12-18 ENCOUNTER — Other Ambulatory Visit: Payer: Self-pay | Admitting: Medical Genetics

## 2023-12-18 DIAGNOSIS — Z006 Encounter for examination for normal comparison and control in clinical research program: Secondary | ICD-10-CM

## 2023-12-18 NOTE — Progress Notes (Unsigned)
 Initial result was a TNP. New order requested. Confirmed consent on file.

## 2023-12-21 DIAGNOSIS — J4530 Mild persistent asthma, uncomplicated: Secondary | ICD-10-CM | POA: Diagnosis not present

## 2023-12-21 DIAGNOSIS — J3089 Other allergic rhinitis: Secondary | ICD-10-CM | POA: Diagnosis not present

## 2023-12-21 DIAGNOSIS — J301 Allergic rhinitis due to pollen: Secondary | ICD-10-CM | POA: Diagnosis not present

## 2023-12-22 ENCOUNTER — Encounter: Payer: Self-pay | Admitting: Surgery

## 2023-12-23 DIAGNOSIS — Z713 Dietary counseling and surveillance: Secondary | ICD-10-CM | POA: Diagnosis not present

## 2023-12-25 ENCOUNTER — Other Ambulatory Visit
Admission: RE | Admit: 2023-12-25 | Discharge: 2023-12-25 | Disposition: A | Discharge status: Home / Self Care | Payer: Self-pay | Source: Ambulatory Visit | Attending: Oncology | Admitting: Oncology

## 2023-12-25 DIAGNOSIS — Z006 Encounter for examination for normal comparison and control in clinical research program: Secondary | ICD-10-CM | POA: Insufficient documentation

## 2024-01-04 ENCOUNTER — Encounter (HOSPITAL_BASED_OUTPATIENT_CLINIC_OR_DEPARTMENT_OTHER): Admitting: Cardiovascular Disease

## 2024-01-05 LAB — GENECONNECT MOLECULAR SCREEN: Genetic Analysis Overall Interpretation: NEGATIVE

## 2024-01-13 DIAGNOSIS — Z713 Dietary counseling and surveillance: Secondary | ICD-10-CM | POA: Diagnosis not present

## 2024-01-14 DIAGNOSIS — C801 Malignant (primary) neoplasm, unspecified: Secondary | ICD-10-CM

## 2024-01-14 HISTORY — DX: Malignant (primary) neoplasm, unspecified: C80.1

## 2024-01-16 DIAGNOSIS — G4733 Obstructive sleep apnea (adult) (pediatric): Secondary | ICD-10-CM | POA: Diagnosis not present

## 2024-01-22 ENCOUNTER — Other Ambulatory Visit: Payer: Self-pay | Admitting: Family

## 2024-01-22 DIAGNOSIS — Z1231 Encounter for screening mammogram for malignant neoplasm of breast: Secondary | ICD-10-CM

## 2024-02-01 ENCOUNTER — Ambulatory Visit
Admission: RE | Admit: 2024-02-01 | Discharge: 2024-02-01 | Disposition: A | Discharge status: Home / Self Care | Source: Ambulatory Visit | Attending: Family | Admitting: Family

## 2024-02-01 DIAGNOSIS — Z1231 Encounter for screening mammogram for malignant neoplasm of breast: Secondary | ICD-10-CM | POA: Diagnosis not present

## 2024-02-05 ENCOUNTER — Other Ambulatory Visit: Payer: Self-pay | Admitting: Family

## 2024-02-05 DIAGNOSIS — R928 Other abnormal and inconclusive findings on diagnostic imaging of breast: Secondary | ICD-10-CM

## 2024-02-06 ENCOUNTER — Ambulatory Visit: Payer: Self-pay | Admitting: Family

## 2024-02-08 ENCOUNTER — Ambulatory Visit
Admission: RE | Admit: 2024-02-08 | Discharge: 2024-02-08 | Disposition: A | Discharge status: Home / Self Care | Source: Ambulatory Visit | Attending: Family | Admitting: Family

## 2024-02-08 ENCOUNTER — Other Ambulatory Visit: Payer: Self-pay | Admitting: Family

## 2024-02-08 DIAGNOSIS — R928 Other abnormal and inconclusive findings on diagnostic imaging of breast: Secondary | ICD-10-CM | POA: Diagnosis not present

## 2024-02-08 DIAGNOSIS — R92322 Mammographic fibroglandular density, left breast: Secondary | ICD-10-CM | POA: Diagnosis not present

## 2024-02-08 DIAGNOSIS — R921 Mammographic calcification found on diagnostic imaging of breast: Secondary | ICD-10-CM | POA: Diagnosis not present

## 2024-02-09 ENCOUNTER — Ambulatory Visit: Payer: Self-pay | Admitting: Family

## 2024-02-11 ENCOUNTER — Other Ambulatory Visit: Payer: Self-pay | Admitting: Family

## 2024-02-11 DIAGNOSIS — I10 Essential (primary) hypertension: Secondary | ICD-10-CM

## 2024-02-12 ENCOUNTER — Ambulatory Visit
Admission: RE | Admit: 2024-02-12 | Discharge: 2024-02-12 | Disposition: A | Discharge status: Home / Self Care | Source: Ambulatory Visit | Attending: Family | Admitting: Family

## 2024-02-12 ENCOUNTER — Inpatient Hospital Stay
Admission: RE | Admit: 2024-02-12 | Discharge: 2024-02-12 | Discharge status: Home / Self Care | Source: Ambulatory Visit | Attending: Family

## 2024-02-12 DIAGNOSIS — R928 Other abnormal and inconclusive findings on diagnostic imaging of breast: Secondary | ICD-10-CM

## 2024-02-12 DIAGNOSIS — D0512 Intraductal carcinoma in situ of left breast: Secondary | ICD-10-CM | POA: Insufficient documentation

## 2024-02-12 HISTORY — PX: BREAST BIOPSY: SHX20

## 2024-02-12 MED ORDER — LIDOCAINE-EPINEPHRINE 1 %-1:100000 IJ SOLN
20.0000 mL | Freq: Once | INTRAMUSCULAR | Status: AC
  Administered 2024-02-12: 20 mL
  Filled 2024-02-12: qty 20

## 2024-02-12 MED ORDER — LIDOCAINE 1 % OPTIME INJ - NO CHARGE
5.0000 mL | Freq: Once | INTRAMUSCULAR | Status: AC
  Administered 2024-02-12: 5 mL
  Filled 2024-02-12: qty 6

## 2024-02-12 NOTE — Progress Notes (Signed)
 noted

## 2024-02-13 LAB — SURGICAL PATHOLOGY

## 2024-02-14 ENCOUNTER — Encounter: Payer: Self-pay | Admitting: *Deleted

## 2024-02-14 ENCOUNTER — Ambulatory Visit: Payer: Self-pay | Admitting: Family

## 2024-02-14 ENCOUNTER — Other Ambulatory Visit: Payer: Self-pay | Admitting: Licensed Clinical Social Worker

## 2024-02-14 DIAGNOSIS — Z803 Family history of malignant neoplasm of breast: Secondary | ICD-10-CM

## 2024-02-14 DIAGNOSIS — D0512 Intraductal carcinoma in situ of left breast: Secondary | ICD-10-CM

## 2024-02-14 NOTE — Progress Notes (Signed)
 Received referral for newly diagnosed breast cancer from [facility].  Navigation initiated.  She will see Dr. Babara on Monday 7/7 at 3:30.   Referral placed to Dr. Desiderio as she has seen him previously.

## 2024-02-14 NOTE — Progress Notes (Signed)
 noted

## 2024-02-15 DIAGNOSIS — G4733 Obstructive sleep apnea (adult) (pediatric): Secondary | ICD-10-CM | POA: Diagnosis not present

## 2024-02-15 NOTE — Progress Notes (Signed)
 noted

## 2024-02-19 ENCOUNTER — Telehealth: Payer: Self-pay | Admitting: Surgery

## 2024-02-19 ENCOUNTER — Ambulatory Visit: Admitting: Surgery

## 2024-02-19 ENCOUNTER — Inpatient Hospital Stay: Attending: Oncology | Admitting: Oncology

## 2024-02-19 ENCOUNTER — Encounter: Payer: Self-pay | Admitting: Oncology

## 2024-02-19 ENCOUNTER — Encounter: Payer: Self-pay | Admitting: Surgery

## 2024-02-19 ENCOUNTER — Encounter: Payer: Self-pay | Admitting: *Deleted

## 2024-02-19 ENCOUNTER — Other Ambulatory Visit: Payer: Self-pay | Admitting: Surgery

## 2024-02-19 ENCOUNTER — Inpatient Hospital Stay

## 2024-02-19 VITALS — BP 133/57 | HR 89 | Temp 98.0°F | Resp 18 | Wt 255.1 lb

## 2024-02-19 VITALS — BP 138/80 | HR 82 | Ht 66.0 in | Wt 252.0 lb

## 2024-02-19 DIAGNOSIS — Z8616 Personal history of COVID-19: Secondary | ICD-10-CM | POA: Insufficient documentation

## 2024-02-19 DIAGNOSIS — Z780 Asymptomatic menopausal state: Secondary | ICD-10-CM | POA: Insufficient documentation

## 2024-02-19 DIAGNOSIS — E669 Obesity, unspecified: Secondary | ICD-10-CM | POA: Diagnosis not present

## 2024-02-19 DIAGNOSIS — D0512 Intraductal carcinoma in situ of left breast: Secondary | ICD-10-CM | POA: Diagnosis not present

## 2024-02-19 DIAGNOSIS — Z881 Allergy status to other antibiotic agents status: Secondary | ICD-10-CM | POA: Diagnosis not present

## 2024-02-19 DIAGNOSIS — N951 Menopausal and female climacteric states: Secondary | ICD-10-CM | POA: Insufficient documentation

## 2024-02-19 DIAGNOSIS — Z8249 Family history of ischemic heart disease and other diseases of the circulatory system: Secondary | ICD-10-CM | POA: Insufficient documentation

## 2024-02-19 DIAGNOSIS — Z833 Family history of diabetes mellitus: Secondary | ICD-10-CM | POA: Diagnosis not present

## 2024-02-19 DIAGNOSIS — Z79899 Other long term (current) drug therapy: Secondary | ICD-10-CM | POA: Diagnosis not present

## 2024-02-19 DIAGNOSIS — Z170 Estrogen receptor positive status [ER+]: Secondary | ICD-10-CM | POA: Diagnosis not present

## 2024-02-19 DIAGNOSIS — N6082 Other benign mammary dysplasias of left breast: Secondary | ICD-10-CM | POA: Diagnosis not present

## 2024-02-19 DIAGNOSIS — Z803 Family history of malignant neoplasm of breast: Secondary | ICD-10-CM

## 2024-02-19 DIAGNOSIS — Z9049 Acquired absence of other specified parts of digestive tract: Secondary | ICD-10-CM | POA: Diagnosis not present

## 2024-02-19 DIAGNOSIS — Z809 Family history of malignant neoplasm, unspecified: Secondary | ICD-10-CM | POA: Insufficient documentation

## 2024-02-19 DIAGNOSIS — Z823 Family history of stroke: Secondary | ICD-10-CM | POA: Diagnosis not present

## 2024-02-19 DIAGNOSIS — I10 Essential (primary) hypertension: Secondary | ICD-10-CM | POA: Insufficient documentation

## 2024-02-19 NOTE — H&P (View-Only) (Signed)
 02/19/2024  Reason for Visit:  Left breast DCIS  Requesting Provider:  [name], FNP  History of Present Illness: Amy Watson is a 53 y.o. female presenting for evaluation of left breast DCIS.  The patient had her yearly screening mammogram on 02/01/24 that found a group of calcifications in the upper outer quadrant.  She had a follow up diagnostic mammogram that confirmed this and biopsy of the area was done on 02/12/24.  This unfortunately has resulted in DCIS.  She reports mild bruising in the left breast biopsy site which is resolving.  She does have a family history of breast cancer in her mother.  Menarche age was 11 years, she has had 2 pregnancies and did breast-feed.  She has gone through menopause and did have birth control in the past.  She did not feel any lumps or pain in the breast and denies any skin changes or nipple drainage.  In the past she had a [facility] molecular screening which was negative for BRCA1 and BRCA2.  Past Medical History: Past Medical History:  Diagnosis Date   Allergy    Anxiety    Arthritis    Asthma    COVID-19    Depression    Dyspnea    Gallbladder sludge 2025   Hypertension    Obesity    PONV (postoperative nausea and vomiting)    Pre-diabetes      Past Surgical History: Past Surgical History:  Procedure Laterality Date   BREAST BIOPSY Left 02/12/2024   MM LT BREAST BX W LOC DEV 1ST LESION IMAGE BX SPEC STEREO GUIDE 02/12/2024 [facility]   CHOLECYSTECTOMY     COLONOSCOPY     DENTAL SURGERY     FOOT SURGERY     right, mortons neuroma rescetion   FOOT SURGERY     right plantar fascia torn   Knee arthoscopy Left 2025   NECK SURGERY     laser surgery    Home Medications: Prior to Admission medications   Medication Sig Start Date End Date Taking? Authorizing Provider  acetaminophen  (TYLENOL ) 500 MG tablet Take 2 tablets (1,000 mg total) by mouth every 6 (six) hours as needed for mild pain (pain score 1-3). 10/26/23  Yes  Melina Mosteller, Aloysius, MD  amLODipine  (NORVASC ) 10 MG tablet Take 1 tablet (10 mg total) by mouth daily. 09/28/23  Yes Dugal, Ginger, FNP  B Complex Vitamins (B COMPLEX PO) Take 1 tablet by mouth daily.   Yes [provider]  busPIRone  (BUSPAR ) 10 MG tablet Take 10 mg by mouth 2 (two) times daily.   Yes [provider]  Calcium  Carbonate+Vitamin D  (CALCIUM  600+D3) 600-200 MG-UNIT TABS Take 1 tablet by mouth in the morning and at bedtime. 03/23/21  Yes Velma Raisin, MD  cetirizine (ZYRTEC) 10 MG tablet Take 10 mg by mouth daily as needed for allergies.   Yes [provider]  Cholecalciferol  (VITAMIN D3) 1.25 MG (50000 UT) CAPS TAKE 1 TABLET BY MOUTH ONCE A WEEK. 07/01/20  Yes Velma Raisin, MD  escitalopram  (LEXAPRO ) 20 MG tablet TAKE 1 TABLET BY MOUTH EVERY DAY 11/30/23  Yes Dugal, Tabitha, FNP  fluticasone furoate-vilanterol (BREO ELLIPTA) 200-25 MCG/ACT AEPB Inhale 1 puff into the lungs daily.   Yes [provider]  hydrochlorothiazide  (HYDRODIURIL ) 25 MG tablet Take 1 tablet (25 mg total) by mouth daily. 10/23/23 02/19/24 Yes Raford Riggs, MD  ibuprofen  (ADVIL ) 600 MG tablet Take 1 tablet (600 mg total) by mouth every 8 (eight) hours as needed for  moderate pain (pain score 4-6). 10/26/23  Yes Keirstyn Aydt, Aloysius, MD  montelukast (SINGULAIR) 10 MG tablet Take 10 mg by mouth at bedtime as needed (allergies).   Yes [provider]  omeprazole  (PRILOSEC ) 20 MG capsule Take 1 capsule (20 mg total) by mouth daily. Patient taking differently: Take 20 mg by mouth as needed. 11/08/23  Yes Samyra Limb, Aloysius, MD  PROAIR HFA 108 [phone] Base) MCG/ACT inhaler Inhale 1-2 puffs into the lungs every 4 (four) hours as needed for wheezing or shortness of breath. 11/08/18  Yes [provider]  valsartan  (DIOVAN ) 320 MG tablet TAKE 1 TABLET BY MOUTH EVERY DAY 11/30/23  Yes Corwin Antu, FNP    Allergies: Allergies  Allergen Reactions   Prozac [Fluoxetine] Other (See Comments)     fatigue   Azithromycin Other (See Comments)    C. Diff./  Pain     Social History:  reports that she has never smoked. She has never been exposed to tobacco smoke. She has never used smokeless tobacco. She reports current alcohol use. She reports that she does not use drugs.   Family History: Family History  Problem Relation Age of Onset   Breast cancer Mother 66   Diabetes Father    Hypertension Father    Stroke Maternal Aunt    Skin cancer Maternal Grandfather     Review of Systems: Review of Systems  Constitutional:  Negative for chills and fever.  HENT:  Negative for hearing loss.   Respiratory:  Negative for cough.   Cardiovascular:  Negative for chest pain.  Gastrointestinal:  Negative for nausea and vomiting.  Genitourinary:  Negative for dysuria.  Musculoskeletal:  Negative for myalgias.  Skin:        Mild bruising at biopsy site in the left breast  Neurological:  Negative for dizziness.  Psychiatric/Behavioral:  Negative for depression.     Physical Exam BP 138/80   Pulse 82   Ht 5' 6 (1.676 m)   Wt 252 lb (114.3 kg)   LMP 04/06/2017   SpO2 98%   BMI 40.67 kg/m  CONSTITUTIONAL: No acute distress HEENT:  Normocephalic, atraumatic, extraocular motion intact. NECK: Trachea is midline, and there is no jugular venous distension.  RESPIRATORY:  Lungs are clear, and breath sounds are equal bilaterally. Normal respiratory effort without pathologic use of accessory muscles. CARDIOVASCULAR: Heart is regular without murmurs, gallops, or rubs. BREAST: Left breast without any palpable masses, skin changes other than resolving ecchymosis at the biopsy site, or nipple changes.  No left axillary lymphadenopathy.  Right breast without any palpable masses, skin changes, or nipple changes.  No right axillary lymphadenopathy. MUSCULOSKELETAL:  Normal muscle strength and tone in all four extremities.  No peripheral edema or cyanosis. SKIN: Skin turgor is normal. There are no  pathologic skin lesions.  NEUROLOGIC:  Motor and sensation is grossly normal.  Cranial nerves are grossly intact. PSYCH:  Alert and oriented to person, place and time. Affect is normal.  Laboratory Analysis: Left breast biopsy on 02/12/2024: FINAL DIAGNOSIS       1. Breast, left, needle core biopsy, Upper outer posterior depth calcifications (x clip) :       - DUCTAL CARCINOMA IN SITU (DCIS), INTERMEDIATE TO HIGH GRADE WITH NECROSIS AND CALCIFICATIONS.       - COLUMNAR CELL CHANGE AND APOCRINE METAPLASIA.   Imaging: Mammogram on 02/08/2024: FINDINGS: Spot magnification views demonstrate an 8 mm group of coarse heterogeneous calcification in the upper slightly outer left breast posterior depth.  This corresponds with the screening mammogram finding and is new compared to prior examinations.   No additional suspicious findings are identified in the left breast.   IMPRESSION: LEFT breast 8 mm group of coarse heterogeneous calcification in the upper-outer quadrant is indeterminate. Recommend further assessment with stereotactic guided biopsy.   RECOMMENDATION: LEFT breast stereotactic guided biopsy (1 site).   I have discussed the findings and recommendations with the patient. The biopsy procedure was discussed with the patient and questions were answered. Patient expressed their understanding of the biopsy recommendation. Patient will be scheduled for biopsy at her earliest convenience by the schedulers. Ordering provider will be notified. If applicable, a reminder letter will be sent to the patient regarding the next appointment.   BI-RADS CATEGORY  4: Suspicious.  Assessment and Plan: This is a 53 y.o. female with newly diagnosed left breast DCIS.  - Discussed with patient the findings on her mammogram and biopsy results.  Overall she does have DCIS which is ER/PR positive.  Discussed with her that this is considered stage 0 breast cancer as it is noninvasive breast cancer.  However it  does require treatment.  Discussed with her the options for tag localized lumpectomy versus mastectomy.  She would rather proceed with breast conservation therapy and discussed with her that we can certainly offer a lumpectomy in this case.  Discussed with her that following lumpectomy we will also recommend radiation therapy and will refer her to Dr. Lenn with radiation oncology.  She has an appointment with Dr. Babara this afternoon as well. - Discussed with her then the plan for a left breast tag localized lumpectomy and reviewed the surgery at length with her including preoperative outpatient tag localization with the normal breast center, planned incision the day of surgery, risks of bleeding, infection, injury to surrounding structures, this will be an outpatient procedure, usual follow-up breast binder postoperatively, activity restrictions, pain control, and she is willing to proceed. - Patient will be scheduled for surgery on 02/29/2024.  All of her questions have been answered.  I spent 40 minutes dedicated to the care of this patient on the date of this encounter to include pre-visit review of records, face-to-face time with the patient discussing diagnosis and management, and any post-visit coordination of care.   Aloysius Sheree Plant, MD [facility]

## 2024-02-19 NOTE — Telephone Encounter (Signed)
 Patient has been advised of Pre-Admission date/time, and Surgery date at [facility].  Surgery Date: 02/29/24 Preadmission Testing Date: 02/23/24 (phone 8a-1p)  Patient informed of the scheduling process and surgery information given at time of office visit.   Patient scheduled for breast tag placement at [facility] 02/22/24.    Patient has been made aware to call [phone], between 1-3:00pm the day before surgery, to find out what time to arrive for surgery.

## 2024-02-19 NOTE — Progress Notes (Signed)
 Hematology/Oncology Consult Note [phone] [phone]     REFERRING PROVIDER: Corwin Antu, FNP    CHIEF COMPLAINTS/PURPOSE OF CONSULT Left breast DCIS  ASSESSMENT & PLAN:   Perimenopausal LMP over 1 year ago.  Check FSH and estradiol  level.  Ductal carcinoma in situ (DCIS) of left breast The DCIS diagnosis and care plan were discussed with patient in detail.  ER positive. Recommend left breast lumpectomy followed by radiation followed by adjuvant endocrine therapy for 5 years. She has establish care with surgeon.   Family history of cancer Refer to genetic counselor.     Orders Placed This Encounter  Procedures   CBC (Cancer Center Only)    Standing Status:   Future    Expected Date:   02/19/2024    Expiration Date:   05/19/2024   CMP (Cancer Center only)    Standing Status:   Future    Expected Date:   02/19/2024    Expiration Date:   05/19/2024   Follicle stimulating hormone    Standing Status:   Future    Expected Date:   02/19/2024    Expiration Date:   05/19/2024   Estradiol     Standing Status:   Future    Expected Date:   02/19/2024    Expiration Date:   05/19/2024   Follow-up to be determined. All questions were answered. The patient knows to call the clinic with any problems, questions or concerns.  [name], MD, PhD [facility] Hematology Oncology 02/19/2024    HISTORY OF PRESENTING ILLNESS:  Amy Watson 53 y.o. female presents to establish care for left breast DCIS I have reviewed her chart and materials related to her cancer extensively and collaborated history with the patient. Summary of oncologic history is as follows: Oncology History  Ductal carcinoma in situ (DCIS) of left breast  02/01/2024 Mammogram   Bilateral screening mammogram showed left breast calcifications warrant further evaluation.   02/08/2024 Imaging   LEFT breast 8 mm group of coarse heterogeneous calcification in the upper-outer quadrant is  indeterminate. Recommend further assessment with stereotactic guided biopsy.   02/19/2024 Initial Diagnosis   Ductal carcinoma in situ (DCIS) of left breast  Patient underwent left breast needle core biopsy of upper outer posterior depth calcifications.  Pathology showed DCIS, intermediate to high-grade with necrosis and calcifications.  Columnar cell change and apocrine metaplasia.  Menarche at age of 72 or 10 First live birth at age of 32 OCP use: >5 years,  Menopausal status: LMP over 1 year ago History of HRT use: No History of chest radiation: No Number of previous breast biopsies:  No   02/19/2024 Cancer Staging   Staging form: Breast, AJCC 8th Edition - Clinical stage from 02/19/2024: Stage 0 (cTis (DCIS), cN0, cM0, ER+, PR: Not Assessed, HER2: Not Assessed) - Signed by Cap Zelphia, MD on 02/19/2024 Stage prefix: Initial diagnosis Nuclear grade: G2    Today patient presents to discuss pathology results and management plan.  She has no new complaints.  She has establish care with surgery.  MEDICAL HISTORY:  Past Medical History:  Diagnosis Date   Allergy    Anxiety    Arthritis    Asthma    COVID-19    Depression    Dyspnea    Gallbladder sludge 2025   Hypertension    Obesity    PONV (postoperative nausea and vomiting)    Pre-diabetes     SURGICAL HISTORY: Past Surgical History:  Procedure Laterality Date  BREAST BIOPSY Left 02/12/2024   MM LT BREAST BX W LOC DEV 1ST LESION IMAGE BX SPEC STEREO GUIDE 02/12/2024 [facility]   CHOLECYSTECTOMY     COLONOSCOPY     DENTAL SURGERY     FOOT SURGERY     right, mortons neuroma rescetion   FOOT SURGERY     right plantar fascia torn   Knee arthoscopy Left 2025   NECK SURGERY     laser surgery    SOCIAL HISTORY: Social History   Socioeconomic History   Marital status: Divorced    Spouse name: Not on file   Number of children: 2   Years of education: some college   Highest education level: Some college, no  degree  Occupational History   Not on file  Tobacco Use   Smoking status: Never    Passive exposure: Never   Smokeless tobacco: Never  Vaping Use   Vaping status: Never Used  Substance and Sexual Activity   Alcohol use: Yes    Comment: rarely   Drug use: No   Sexual activity: Not Currently  Other Topics Concern   Not on file  Social History Narrative   05/06/20   From: [place]   Living: oldest daughter Jordis)   Work: Production assistant, radio and [employer]      Family: 2 children - Insurance claims handler and Lauraine      Enjoys: read, yardwork      Exercise: parttime job - steps at work   Diet: working with obesity specialist to try to reduce appetite      Safety   Seat belts: Yes    Guns: Yes  and secure   Safe in relationships: Yes    Social Drivers of Corporate investment banker Strain: Low Risk  (02/19/2024)   Overall Financial Resource Strain (CARDIA)    Difficulty of Paying Living Expenses: Not very hard  Recent Concern: Financial Resource Strain - Medium Risk (02/19/2024)   Overall Financial Resource Strain (CARDIA)    Difficulty of Paying Living Expenses: Somewhat hard  Food Insecurity: No Food Insecurity (02/19/2024)   Hunger Vital Sign    Worried About Running Out of Food in the Last Year: Never true    Ran Out of Food in the Last Year: Never true  Transportation Needs: No Transportation Needs (09/13/2023)   PRAPARE - Administrator, Civil Service (Medical): No    Lack of Transportation (Non-Medical): No  Physical Activity: Unknown (09/13/2023)   Exercise Vital Sign    Days of Exercise per Week: 0 days    Minutes of Exercise per Session: Not on file  Recent Concern: Physical Activity - Inactive (09/13/2023)   Exercise Vital Sign    Days of Exercise per Week: 0 days    Minutes of Exercise per Session: 20 min  Stress: No Stress Concern Present (02/19/2024)   [employer] of Occupational Health - Occupational Stress Questionnaire    Feeling of Stress: Not at all  Social  Connections: Unknown (09/13/2023)   Social Connection and Isolation Panel    Frequency of Communication with Friends and Family: More than three times a week    Frequency of Social Gatherings with Friends and Family: Twice a week    Attends Religious Services: Patient declined    Database administrator or Organizations: No    Attends Banker Meetings: Not on file    Marital Status: Patient declined  Intimate Partner Violence: Not At Risk (02/19/2024)   Humiliation, Afraid, Rape,  and Kick questionnaire    Fear of Current or Ex-Partner: No    Emotionally Abused: No    Physically Abused: No    Sexually Abused: No    FAMILY HISTORY: Family History  Problem Relation Age of Onset   Breast cancer Mother 4   Diabetes Father    Hypertension Father    Stroke Maternal Aunt    Skin cancer Maternal Grandfather     ALLERGIES:  is allergic to prozac [fluoxetine] and azithromycin.  MEDICATIONS:  Current Outpatient Medications  Medication Sig Dispense Refill   acetaminophen  (TYLENOL ) 500 MG tablet Take 2 tablets (1,000 mg total) by mouth every 6 (six) hours as needed for mild pain (pain score 1-3).     amLODipine  (NORVASC ) 10 MG tablet Take 1 tablet (10 mg total) by mouth daily. 90 tablet 3   B Complex Vitamins (B COMPLEX PO) Take 1 tablet by mouth daily.     busPIRone  (BUSPAR ) 10 MG tablet Take 10 mg by mouth 2 (two) times daily.     Calcium  Carbonate+Vitamin D  (CALCIUM  600+D3) 600-200 MG-UNIT TABS Take 1 tablet by mouth in the morning and at bedtime. 180 tablet 3   cetirizine (ZYRTEC) 10 MG tablet Take 10 mg by mouth daily as needed for allergies.     Cholecalciferol  (VITAMIN D3) 1.25 MG (50000 UT) CAPS TAKE 1 TABLET BY MOUTH ONCE A WEEK. 4 capsule 0   escitalopram  (LEXAPRO ) 20 MG tablet TAKE 1 TABLET BY MOUTH EVERY DAY 90 tablet 3   fluticasone furoate-vilanterol (BREO ELLIPTA) 200-25 MCG/ACT AEPB Inhale 1 puff into the lungs daily.     hydrochlorothiazide  (HYDRODIURIL ) 25 MG  tablet Take 1 tablet (25 mg total) by mouth daily. 90 tablet 3   ibuprofen  (ADVIL ) 600 MG tablet Take 1 tablet (600 mg total) by mouth every 8 (eight) hours as needed for moderate pain (pain score 4-6). 60 tablet 1   montelukast (SINGULAIR) 10 MG tablet Take 10 mg by mouth at bedtime as needed (allergies).     omeprazole  (PRILOSEC ) 20 MG capsule Take 1 capsule (20 mg total) by mouth daily. 30 capsule 1   PROAIR HFA 108 (90 Base) MCG/ACT inhaler Inhale 1-2 puffs into the lungs every 4 (four) hours as needed for wheezing or shortness of breath.     valsartan  (DIOVAN ) 320 MG tablet TAKE 1 TABLET BY MOUTH EVERY DAY 90 tablet 3   No current facility-administered medications for this visit.    Review of Systems  Constitutional:  Negative for appetite change, chills, fatigue and fever.  HENT:   Negative for hearing loss and voice change.   Eyes:  Negative for eye problems.  Respiratory:  Negative for chest tightness and cough.   Cardiovascular:  Negative for chest pain.  Gastrointestinal:  Negative for abdominal distention, abdominal pain and blood in stool.  Endocrine: Negative for hot flashes.  Genitourinary:  Negative for difficulty urinating and frequency.   Musculoskeletal:  Negative for arthralgias.  Skin:  Negative for itching and rash.  Neurological:  Negative for extremity weakness.  Hematological:  Negative for adenopathy.  Psychiatric/Behavioral:  Negative for confusion.      PHYSICAL EXAMINATION: ECOG PERFORMANCE STATUS: 0 - Asymptomatic  Vitals:   02/19/24 1540  BP: (!) 133/57  Pulse: 89  Resp: 18  Temp: 98 F (36.7 C)  SpO2: 97%   Filed Weights   02/19/24 1540  Weight: 255 lb 1.6 oz (115.7 kg)    Physical Exam Constitutional:      General:  She is not in acute distress.    Appearance: She is not diaphoretic.  HENT:     Head: Normocephalic and atraumatic.  Eyes:     General: No scleral icterus. Cardiovascular:     Rate and Rhythm: Normal rate and regular  rhythm.     Heart sounds: No murmur heard. Pulmonary:     Effort: Pulmonary effort is normal. No respiratory distress.     Breath sounds: No wheezing.  Abdominal:     General: There is no distension.     Palpations: Abdomen is soft.     Tenderness: There is no abdominal tenderness.  Musculoskeletal:        General: Normal range of motion.     Cervical back: Normal range of motion and neck supple.  Skin:    General: Skin is warm and dry.     Findings: No erythema.  Neurological:     Mental Status: She is alert and oriented to person, place, and time. Mental status is at baseline.     Cranial Nerves: No cranial nerve deficit.     Motor: No abnormal muscle tone.  Psychiatric:        Mood and Affect: Mood and affect normal.     Breast exam was performed in seated and lying down position. Patient is status post left breast biopsy with focal tissue swelling.   No palpable breast masses bilaterally.  No palpable axillary adenopathy bilaterally.   LABORATORY DATA:  I have reviewed the data as listed    Latest Ref Rng & Units 09/14/2023    7:50 AM 05/09/2023    9:22 AM 11/10/2022   12:27 PM  CBC  WBC 4.0 - 10.5 K/uL 7.4  8.5  9.8   Hemoglobin 12.0 - 15.0 g/dL 85.8  84.3  85.2   Hematocrit 36.0 - 46.0 % 42.0  46.4  42.6   Platelets 150.0 - 400.0 K/uL 271.0  288  351.0       Latest Ref Rng & Units 11/14/2023   11:27 AM 10/19/2023   11:30 AM 05/09/2023    9:22 AM  CMP  Glucose 70 - 99 mg/dL 884   890   BUN 6 - 24 mg/dL 10   13   Creatinine 9.42 - 1.00 mg/dL 9.46   9.39   Sodium 865 - 144 mmol/L 139   136   Potassium 3.5 - 5.2 mmol/L 3.7   4.3   Chloride 96 - 106 mmol/L 102   103   CO2 20 - 29 mmol/L 22   24   Calcium  8.7 - 10.2 mg/dL 9.2  9.1  9.0      RADIOGRAPHIC STUDIES: I have personally reviewed the radiological images as listed and agreed with the findings in the report. MM LT BREAST BX W LOC DEV 1ST LESION IMAGE BX SPEC STEREO GUIDE Addendum Date: 02/14/2024 ADDENDUM  REPORT: 02/14/2024 12:47 ADDENDUM: PATHOLOGY revealed: 1. Breast, left, needle core biopsy, Upper outer posterior depth calcifications (x clip) - DUCTAL CARCINOMA IN SITU (DCIS), INTERMEDIATE TO HIGH GRADE WITH NECROSIS AND CALCIFICATIONS. - COLUMNAR CELL CHANGE AND APOCRINE METAPLASIA. Pathology results are CONCORDANT with imaging findings, per Dr. Corean Salter. Pathology results and recommendations were discussed with patient via telephone on 02/13/2024 by Rock Hover RN. Patient reported biopsy site doing well with no adverse symptoms, and slight tenderness at the site. Post biopsy care instructions were reviewed, questions were answered and my direct phone number was provided. Patient was instructed to call [facility] for any additional questions or concerns related to biopsy site. RECOMMENDATIONS: 1. Surgical and oncological consultation. Request for surgical and oncological consultation relayed to Shasta Ada RN at [facility] by Rock Hover RN on 02/14/2024. Pathology results reported by Rock Hover RN on 02/14/2024. Electronically Signed   By: Corean Salter M.D.   On: 02/14/2024 12:47   Result Date: 02/14/2024 CLINICAL DATA:  Indeterminate calcifications EXAM: LEFT BREAST STEREOTACTIC CORE NEEDLE BIOPSY COMPARISON:  Previous exam(s). FINDINGS: The patient and I discussed the procedure of stereotactic-guided biopsy including benefits and alternatives. We discussed the high likelihood of a successful procedure. We discussed the risks of the procedure including infection, bleeding, tissue injury, clip migration, and inadequate sampling. Informed written consent was given. The usual time out protocol was performed immediately prior to the procedure. Using sterile technique and 1% lidocaine  and 1% lidocaine  with epinephrine  as local anesthetic, under stereotactic guidance, a 9 gauge vacuum assisted device was used to perform core needle biopsy of calcifications in the upper outer  quadrant of the left breast using a lateral approach. Specimen radiograph was performed showing representative calcifications. Specimens with calcifications are identified for pathology. Lesion quadrant: Upper outer quadrant At the conclusion of the procedure, an X shaped tissue marker clip was deployed into the biopsy cavity. Follow-up 2-view mammogram was performed and dictated separately. IMPRESSION: Stereotactic-guided biopsy of LEFT breast calcifications. No apparent complications. Electronically Signed: By: Corean Salter M.D. On: 02/12/2024 08:36   MM CLIP PLACEMENT LEFT Result Date: 02/12/2024 CLINICAL DATA:  Status post stereotactic guided biopsy EXAM: 3D DIAGNOSTIC LEFT MAMMOGRAM POST STEREOTACTIC BIOPSY COMPARISON:  Previous exam(s). ACR Breast Density Category b: There are scattered areas of fibroglandular density. FINDINGS: 3D Mammographic images were obtained following stereotactic guided biopsy of calcifications. The X shaped biopsy marking clip is favored mildly medially displaced by approximately 1 cm. IMPRESSION: Favored mild medial displacement of an X shaped biopsy marking clip from site of biopsy. Final Assessment: Post Procedure Mammograms for Marker Placement Electronically Signed   By: Corean Salter M.D.   On: 02/12/2024 08:32   MM 3D DIAGNOSTIC MAMMOGRAM UNILATERAL LEFT BREAST Result Date: 02/08/2024 CLINICAL DATA:  Screening recall for possible LEFT breast calcifications. Patient reports family history including mother diagnosed with breast cancer at age 24. EXAM: DIGITAL DIAGNOSTIC UNILATERAL LEFT MAMMOGRAM WITH TOMOSYNTHESIS AND CAD TECHNIQUE: Left digital diagnostic mammography and breast tomosynthesis was performed. The images were evaluated with computer-aided detection. COMPARISON:  Previous exam(s). ACR Breast Density Category b: There are scattered areas of fibroglandular density. FINDINGS: Spot magnification views demonstrate an 8 mm group of coarse heterogeneous  calcification in the upper slightly outer left breast posterior depth. This corresponds with the screening mammogram finding and is new compared to prior examinations. No additional suspicious findings are identified in the left breast. IMPRESSION: LEFT breast 8 mm group of coarse heterogeneous calcification in the upper-outer quadrant is indeterminate. Recommend further assessment with stereotactic guided biopsy. RECOMMENDATION: LEFT breast stereotactic guided biopsy (1 site). I have discussed the findings and recommendations with the patient. The biopsy procedure was discussed with the patient and questions were answered. Patient expressed their understanding of the biopsy recommendation. Patient will be scheduled for biopsy at her earliest convenience by the schedulers. Ordering provider will be notified. If applicable, a reminder letter will be sent to the patient regarding the next appointment. BI-RADS CATEGORY  4: Suspicious. Electronically Signed   By: Dirk Arrant M.D.   On: 02/08/2024 16:41   MM 3D  SCREENING MAMMOGRAM BILATERAL BREAST Result Date: 02/05/2024 CLINICAL DATA:  Screening. EXAM: DIGITAL SCREENING BILATERAL MAMMOGRAM WITH TOMOSYNTHESIS AND CAD TECHNIQUE: Bilateral screening digital craniocaudal and mediolateral oblique mammograms were obtained. Bilateral screening digital breast tomosynthesis was performed. The images were evaluated with computer-aided detection. COMPARISON:  Previous exam(s). ACR Breast Density Category b: There are scattered areas of fibroglandular density. FINDINGS: In the left breast, calcifications warrant further evaluation. In the right breast, no findings suspicious for malignancy. IMPRESSION: Further evaluation is suggested for calcifications in the left breast. RECOMMENDATION: Diagnostic mammogram of the left breast. (Code:[id number]-[id number]) The patient will be contacted regarding the findings, and additional imaging will be scheduled. BI-RADS CATEGORY  0: Incomplete:  Need additional imaging evaluation. Electronically Signed   By: Reyes Phi M.D.   On: 02/05/2024 09:02

## 2024-02-19 NOTE — Progress Notes (Signed)
 02/19/2024  Reason for Visit:  Left breast DCIS  Requesting Provider:  [name], FNP  History of Present Illness: Amy Watson is a 53 y.o. female presenting for evaluation of left breast DCIS.  The patient had her yearly screening mammogram on 02/01/24 that found a group of calcifications in the upper outer quadrant.  She had a follow up diagnostic mammogram that confirmed this and biopsy of the area was done on 02/12/24.  This unfortunately has resulted in DCIS.  She reports mild bruising in the left breast biopsy site which is resolving.  She does have a family history of breast cancer in her mother.  Menarche age was 11 years, she has had 2 pregnancies and did breast-feed.  She has gone through menopause and did have birth control in the past.  She did not feel any lumps or pain in the breast and denies any skin changes or nipple drainage.  In the past she had a [employer] molecular screening which was negative for BRCA1 and BRCA2.  Past Medical History: Past Medical History:  Diagnosis Date   Allergy    Anxiety    Arthritis    Asthma    COVID-19    Depression    Dyspnea    Gallbladder sludge 2025   Hypertension    Obesity    PONV (postoperative nausea and vomiting)    Pre-diabetes      Past Surgical History: Past Surgical History:  Procedure Laterality Date   BREAST BIOPSY Left 02/12/2024   MM LT BREAST BX W LOC DEV 1ST LESION IMAGE BX SPEC STEREO GUIDE 02/12/2024 [facility]   CHOLECYSTECTOMY     COLONOSCOPY     DENTAL SURGERY     FOOT SURGERY     right, mortons neuroma rescetion   FOOT SURGERY     right plantar fascia torn   Knee arthoscopy Left 2025   NECK SURGERY     laser surgery    Home Medications: Prior to Admission medications   Medication Sig Start Date End Date Taking? Authorizing Provider  acetaminophen  (TYLENOL ) 500 MG tablet Take 2 tablets (1,000 mg total) by mouth every 6 (six) hours as needed for mild pain (pain score 1-3). 10/26/23  Yes  Melina Mosteller, Aloysius, MD  amLODipine  (NORVASC ) 10 MG tablet Take 1 tablet (10 mg total) by mouth daily. 09/28/23  Yes Dugal, Ginger, FNP  B Complex Vitamins (B COMPLEX PO) Take 1 tablet by mouth daily.   Yes [provider]  busPIRone  (BUSPAR ) 10 MG tablet Take 10 mg by mouth 2 (two) times daily.   Yes [provider]  Calcium  Carbonate+Vitamin D  (CALCIUM  600+D3) 600-200 MG-UNIT TABS Take 1 tablet by mouth in the morning and at bedtime. 03/23/21  Yes Velma Raisin, MD  cetirizine (ZYRTEC) 10 MG tablet Take 10 mg by mouth daily as needed for allergies.   Yes [provider]  Cholecalciferol  (VITAMIN D3) 1.25 MG (50000 UT) CAPS TAKE 1 TABLET BY MOUTH ONCE A WEEK. 07/01/20  Yes Velma Raisin, MD  escitalopram  (LEXAPRO ) 20 MG tablet TAKE 1 TABLET BY MOUTH EVERY DAY 11/30/23  Yes Dugal, Tabitha, FNP  fluticasone furoate-vilanterol (BREO ELLIPTA) 200-25 MCG/ACT AEPB Inhale 1 puff into the lungs daily.   Yes [provider]  hydrochlorothiazide  (HYDRODIURIL ) 25 MG tablet Take 1 tablet (25 mg total) by mouth daily. 10/23/23 02/19/24 Yes Raford Riggs, MD  ibuprofen  (ADVIL ) 600 MG tablet Take 1 tablet (600 mg total) by mouth every 8 (eight) hours as needed for  moderate pain (pain score 4-6). 10/26/23  Yes Keirstyn Aydt, Aloysius, MD  montelukast (SINGULAIR) 10 MG tablet Take 10 mg by mouth at bedtime as needed (allergies).   Yes [provider]  omeprazole  (PRILOSEC ) 20 MG capsule Take 1 capsule (20 mg total) by mouth daily. Patient taking differently: Take 20 mg by mouth as needed. 11/08/23  Yes Samyra Limb, Aloysius, MD  PROAIR HFA 108 [phone] Base) MCG/ACT inhaler Inhale 1-2 puffs into the lungs every 4 (four) hours as needed for wheezing or shortness of breath. 11/08/18  Yes [provider]  valsartan  (DIOVAN ) 320 MG tablet TAKE 1 TABLET BY MOUTH EVERY DAY 11/30/23  Yes Corwin Antu, FNP    Allergies: Allergies  Allergen Reactions   Prozac [Fluoxetine] Other (See Comments)     fatigue   Azithromycin Other (See Comments)    C. Diff./  Pain     Social History:  reports that she has never smoked. She has never been exposed to tobacco smoke. She has never used smokeless tobacco. She reports current alcohol use. She reports that she does not use drugs.   Family History: Family History  Problem Relation Age of Onset   Breast cancer Mother 66   Diabetes Father    Hypertension Father    Stroke Maternal Aunt    Skin cancer Maternal Grandfather     Review of Systems: Review of Systems  Constitutional:  Negative for chills and fever.  HENT:  Negative for hearing loss.   Respiratory:  Negative for cough.   Cardiovascular:  Negative for chest pain.  Gastrointestinal:  Negative for nausea and vomiting.  Genitourinary:  Negative for dysuria.  Musculoskeletal:  Negative for myalgias.  Skin:        Mild bruising at biopsy site in the left breast  Neurological:  Negative for dizziness.  Psychiatric/Behavioral:  Negative for depression.     Physical Exam BP 138/80   Pulse 82   Ht 5' 6 (1.676 m)   Wt 252 lb (114.3 kg)   LMP 04/06/2017   SpO2 98%   BMI 40.67 kg/m  CONSTITUTIONAL: No acute distress HEENT:  Normocephalic, atraumatic, extraocular motion intact. NECK: Trachea is midline, and there is no jugular venous distension.  RESPIRATORY:  Lungs are clear, and breath sounds are equal bilaterally. Normal respiratory effort without pathologic use of accessory muscles. CARDIOVASCULAR: Heart is regular without murmurs, gallops, or rubs. BREAST: Left breast without any palpable masses, skin changes other than resolving ecchymosis at the biopsy site, or nipple changes.  No left axillary lymphadenopathy.  Right breast without any palpable masses, skin changes, or nipple changes.  No right axillary lymphadenopathy. MUSCULOSKELETAL:  Normal muscle strength and tone in all four extremities.  No peripheral edema or cyanosis. SKIN: Skin turgor is normal. There are no  pathologic skin lesions.  NEUROLOGIC:  Motor and sensation is grossly normal.  Cranial nerves are grossly intact. PSYCH:  Alert and oriented to person, place and time. Affect is normal.  Laboratory Analysis: Left breast biopsy on 02/12/2024: FINAL DIAGNOSIS       1. Breast, left, needle core biopsy, Upper outer posterior depth calcifications (x clip) :       - DUCTAL CARCINOMA IN SITU (DCIS), INTERMEDIATE TO HIGH GRADE WITH NECROSIS AND CALCIFICATIONS.       - COLUMNAR CELL CHANGE AND APOCRINE METAPLASIA.   Imaging: Mammogram on 02/08/2024: FINDINGS: Spot magnification views demonstrate an 8 mm group of coarse heterogeneous calcification in the upper slightly outer left breast posterior depth.  This corresponds with the screening mammogram finding and is new compared to prior examinations.   No additional suspicious findings are identified in the left breast.   IMPRESSION: LEFT breast 8 mm group of coarse heterogeneous calcification in the upper-outer quadrant is indeterminate. Recommend further assessment with stereotactic guided biopsy.   RECOMMENDATION: LEFT breast stereotactic guided biopsy (1 site).   I have discussed the findings and recommendations with the patient. The biopsy procedure was discussed with the patient and questions were answered. Patient expressed their understanding of the biopsy recommendation. Patient will be scheduled for biopsy at her earliest convenience by the schedulers. Ordering provider will be notified. If applicable, a reminder letter will be sent to the patient regarding the next appointment.   BI-RADS CATEGORY  4: Suspicious.  Assessment and Plan: This is a 53 y.o. female with newly diagnosed left breast DCIS.  - Discussed with patient the findings on her mammogram and biopsy results.  Overall she does have DCIS which is ER/PR positive.  Discussed with her that this is considered stage 0 breast cancer as it is noninvasive breast cancer.  However it  does require treatment.  Discussed with her the options for tag localized lumpectomy versus mastectomy.  She would rather proceed with breast conservation therapy and discussed with her that we can certainly offer a lumpectomy in this case.  Discussed with her that following lumpectomy we will also recommend radiation therapy and will refer her to Dr. Lenn with radiation oncology.  She has an appointment with Dr. Babara this afternoon as well. - Discussed with her then the plan for a left breast tag localized lumpectomy and reviewed the surgery at length with her including preoperative outpatient tag localization with the normal breast center, planned incision the day of surgery, risks of bleeding, infection, injury to surrounding structures, this will be an outpatient procedure, usual follow-up breast binder postoperatively, activity restrictions, pain control, and she is willing to proceed. - Patient will be scheduled for surgery on 02/29/2024.  All of her questions have been answered.  I spent 40 minutes dedicated to the care of this patient on the date of this encounter to include pre-visit review of records, face-to-face time with the patient discussing diagnosis and management, and any post-visit coordination of care.   Aloysius Sheree Plant, MD [facility]

## 2024-02-19 NOTE — Assessment & Plan Note (Signed)
Refer to genetic counselor.  

## 2024-02-19 NOTE — Patient Instructions (Addendum)
 We have spoken today about removing a lump in your breast. This will be done by Dr. Desiderio at [facility].  You will most likely be able to leave the hospital several hours after your surgery. Rarely, a patient needs to stay over night but this is a possibility.  Plan to tentatively be off work for 1-2 weeks following the surgery and may return with approximately 2 more weeks of a lifting restriction, no greater than 15 lbs.  Please see your Blue surgery sheet for more information. Our surgery scheduler will call you to look at surgery dates and to go over information.   If you have FMLA or Disability paperwork that needs to be filled out, please have your company fax your paperwork to [phone] or you may drop this by either office. This paperwork will be filled out within 3 days after your surgery has been completed.  What is radio frequency localization of the breast?(RFID) RFID/Scout tag localization uses radiofrequency technology to accurately pinpoint the tumor. Seeing exactly where the tumor is before surgery helps surgeons more effectively remove the entire tumor and spare surrounding healthy breast tissue. [facility] will call you to schedule this.     Lumpectomy A lumpectomy is a form of breast conserving or breast preservation surgery. It may also be referred to as a partial mastectomy. During a lumpectomy, the portion of the breast that contains the cancerous tumor or breast mass (the lump) is removed. Some normal tissue around the lump may also be removed to make sure all of the tumor has been removed.  LET [facility] CARE PROVIDER KNOW ABOUT: Any allergies you have. All medicines you are taking, including vitamins, herbs, eye drops, creams, and over-the-counter medicines. Previous problems you or members of your family have had with the use of anesthetics. Any blood disorders you have. Previous surgeries you have had. Medical conditions you have. RISKS AND  COMPLICATIONS Generally, this is a safe procedure. However, problems can occur and include: Bleeding. Infection. Pain. Temporary swelling. Change in the shape of the breast, particularly if a large portion is removed. BEFORE THE PROCEDURE Ask your health care provider about changing or stopping your regular medicines. This is especially important if you are taking diabetes medicines or blood thinners. Do not eat or drink anything after midnight on the night before the procedure or as directed by your health care provider. Ask your health care provider if you can take a sip of water with any approved medicines. On the day of surgery, your health care provider will use a mammogram or ultrasound to locate and mark the tumor in your breast. These markings on your breast will show where the cut (incision) will be made.   PROCEDURE  An IV tube will be put into one of your veins. You may be given medicine to help you relax before the surgery (sedative). You will be given one of the following: A medicine that numbs the area (local anesthetic). A medicine that makes you fall asleep (general anesthetic). Your health care provider will use a kind of electric scalpel that uses heat to minimize bleeding (electrocautery knife). A curved incision (like a smile or frown) that follows the natural curve of your breast is made, to allow for minimal scarring and better healing. The tumor will be removed with some of the surrounding tissue. This will be sent to the lab for analysis. Your health care provider may also remove your lymph nodes at this time if needed. Sometimes,  but not always, a rubber tube called a drain will be surgically inserted into your breast area or armpit to collect excess fluid that may accumulate in the space where the tumor was. This drain is connected to a plastic bulb on the outside of your body. This drain creates suction to help remove the fluid. The incisions will be closed with  stitches (sutures). A bandage may be placed over the incisions. AFTER THE PROCEDURE You will be taken to the recovery area. You will be given medicine for pain. A small rubber drain may be placed in the breast for 2-3 days to prevent a collection of blood (hematoma) from developing in the breast. You will be given instructions on caring for the drain before you go home. A pressure bandage (dressing) will be applied for 1-2 days to prevent bleeding. Ask your health care provider how to care for your bandage at home.   This information is not intended to replace advice given to you by your health care provider. Make sure you discuss any questions you have with your health care provider.   Document Released: 09/12/2006 Document Revised: 08/22/2014 Document Reviewed: 01/04/2013 Elsevier Interactive Patient Education [employer]

## 2024-02-19 NOTE — Assessment & Plan Note (Signed)
 The DCIS diagnosis and care plan were discussed with patient in detail.  ER positive. Recommend left breast lumpectomy followed by radiation followed by adjuvant endocrine therapy for 5 years. She has establish care with surgeon.

## 2024-02-19 NOTE — Assessment & Plan Note (Signed)
 LMP over 1 year ago.  Check FSH and estradiol  level.

## 2024-02-19 NOTE — Progress Notes (Signed)
 Accompanied patient and family to initial medical oncology appointment.   Reviewed Breast Cancer treatment handbook.   Care plan summary given to patient.   Reviewed outreach programs and cancer center services.

## 2024-02-20 ENCOUNTER — Encounter: Payer: Self-pay | Admitting: *Deleted

## 2024-02-20 ENCOUNTER — Ambulatory Visit (HOSPITAL_BASED_OUTPATIENT_CLINIC_OR_DEPARTMENT_OTHER): Admitting: Cardiovascular Disease

## 2024-02-20 ENCOUNTER — Encounter (HOSPITAL_BASED_OUTPATIENT_CLINIC_OR_DEPARTMENT_OTHER): Payer: Self-pay | Admitting: Cardiovascular Disease

## 2024-02-20 ENCOUNTER — Inpatient Hospital Stay

## 2024-02-20 ENCOUNTER — Encounter: Payer: Self-pay | Admitting: Licensed Clinical Social Worker

## 2024-02-20 ENCOUNTER — Other Ambulatory Visit (HOSPITAL_BASED_OUTPATIENT_CLINIC_OR_DEPARTMENT_OTHER): Payer: Self-pay

## 2024-02-20 ENCOUNTER — Inpatient Hospital Stay: Admitting: Licensed Clinical Social Worker

## 2024-02-20 VITALS — BP 132/78 | HR 88 | Ht 66.0 in | Wt 256.0 lb

## 2024-02-20 DIAGNOSIS — D0512 Intraductal carcinoma in situ of left breast: Secondary | ICD-10-CM

## 2024-02-20 DIAGNOSIS — E669 Obesity, unspecified: Secondary | ICD-10-CM | POA: Diagnosis not present

## 2024-02-20 DIAGNOSIS — Z9049 Acquired absence of other specified parts of digestive tract: Secondary | ICD-10-CM | POA: Diagnosis not present

## 2024-02-20 DIAGNOSIS — Z803 Family history of malignant neoplasm of breast: Secondary | ICD-10-CM

## 2024-02-20 DIAGNOSIS — I10 Essential (primary) hypertension: Secondary | ICD-10-CM | POA: Diagnosis not present

## 2024-02-20 DIAGNOSIS — Z823 Family history of stroke: Secondary | ICD-10-CM | POA: Diagnosis not present

## 2024-02-20 DIAGNOSIS — N6082 Other benign mammary dysplasias of left breast: Secondary | ICD-10-CM | POA: Diagnosis not present

## 2024-02-20 DIAGNOSIS — R002 Palpitations: Secondary | ICD-10-CM | POA: Diagnosis not present

## 2024-02-20 DIAGNOSIS — Z833 Family history of diabetes mellitus: Secondary | ICD-10-CM | POA: Diagnosis not present

## 2024-02-20 DIAGNOSIS — Z5181 Encounter for therapeutic drug level monitoring: Secondary | ICD-10-CM

## 2024-02-20 DIAGNOSIS — Z8249 Family history of ischemic heart disease and other diseases of the circulatory system: Secondary | ICD-10-CM | POA: Diagnosis not present

## 2024-02-20 DIAGNOSIS — Z170 Estrogen receptor positive status [ER+]: Secondary | ICD-10-CM | POA: Diagnosis not present

## 2024-02-20 DIAGNOSIS — Z79899 Other long term (current) drug therapy: Secondary | ICD-10-CM | POA: Diagnosis not present

## 2024-02-20 DIAGNOSIS — Z881 Allergy status to other antibiotic agents status: Secondary | ICD-10-CM | POA: Diagnosis not present

## 2024-02-20 DIAGNOSIS — Z8616 Personal history of COVID-19: Secondary | ICD-10-CM | POA: Diagnosis not present

## 2024-02-20 LAB — CMP (CANCER CENTER ONLY)
ALT: 38 U/L (ref 0–44)
AST: 31 U/L (ref 15–41)
Albumin: 4 g/dL (ref 3.5–5.0)
Alkaline Phosphatase: 74 U/L (ref 38–126)
Anion gap: 8 (ref 5–15)
BUN: 14 mg/dL (ref 6–20)
CO2: 23 mmol/L (ref 22–32)
Calcium: 8.8 mg/dL — ABNORMAL LOW (ref 8.9–10.3)
Chloride: 107 mmol/L (ref 98–111)
Creatinine: 0.68 mg/dL (ref 0.44–1.00)
GFR, Estimated: >60 mL/min (ref >60)
Glucose, Bld: 99 mg/dL (ref 70–99)
Potassium: 4.2 mmol/L (ref 3.5–5.1)
Sodium: 138 mmol/L (ref 135–145)
Total Bilirubin: 0.7 mg/dL (ref 0.0–1.2)
Total Protein: 7.2 g/dL (ref 6.5–8.1)

## 2024-02-20 LAB — CBC (CANCER CENTER ONLY)
HCT: 40 % (ref 36.0–46.0)
Hemoglobin: 14 g/dL (ref 12.0–15.0)
MCH: 31.1 pg (ref 26.0–34.0)
MCHC: 35 g/dL (ref 30.0–36.0)
MCV: 88.9 fL (ref 80.0–100.0)
Platelet Count: 268 K/uL (ref 150–400)
RBC: 4.5 MIL/uL (ref 3.87–5.11)
RDW: 12.1 % (ref 11.5–15.5)
WBC Count: 6.3 K/uL (ref 4.0–10.5)
nRBC: 0 % (ref 0.0–0.2)

## 2024-02-20 LAB — GENETIC SCREENING ORDER

## 2024-02-20 MED ORDER — SPIRONOLACTONE 25 MG PO TABS: 25.0000 mg | ORAL_TABLET | Freq: Every day | ORAL | 3 refills | Status: AC

## 2024-02-20 NOTE — Progress Notes (Signed)
 Advanced [facility]pertension [facility] Follow Up:    Date:  02/28/2024   ID:  Amy Watson, DOB March 10, 1971, MRN [id number]  PCP:  Amy Antu, FNP  Cardiologist:  None   Referring MD: Amy Antu, FNP   CC: Hypertension  History of Present Illness:    Amy Watson is a 53 y.o. female with a hx of OSA on CPAP, hypertension, breast cancer, and obesity here for follow up.  She was first seen 10/2023 to establish care in the [facility]. Amy Watson has been working with her PCP, Amy Adolphus, FNP on blood pressure control.  At her appointment 08/2023 and blood pressure was averaging 145-160/85.  She was otherwise feeling well.  At the time she was taking valsartan .  Amlodipine  was added.  She had renal artery Dopplers 09/2023 that were negative for RAS.  Amlodipine  was increased and blood pressures remained elevated.  She is not a candidate for HCTZ due to history of intravascular volume depletion.  Low-dose carvedilol  was added.  Sleep study was ordered.  Amy Watson notes that her hypertension has been challenging to manage despite the use of multiple antihypertensive medications, including amlodipine , carvedilol , and valsartan . Previously, she was on lisinopril  and hydrochlorothiazide , which provided better control. In September, her medication was changed to valsartan  due to high readings of 160/170 mmHg. She was told that her pre-syncopal episode was due to dehydration and HCTZ was discontinued.   Orthostatic vital signs were negative in the office 10/2023.  Given that she did well with it in the past hydrochlorothiazide  was restarted and carvedilol  was discontinued.    Since last appointment Amy Watson was diagnosed with ductal carcinoma in situ of the left breast.  Discussed the use of AI scribe software for clinical note transcription with the patient, who gave verbal consent to proceed.  Amy Watson experiences fluctuations in her blood pressure, with recent episodes  of hypotension. During one incident, she woke up with a headache and felt unwell, leading her to stay home from work. Her blood pressure readings during this episode were between 99 and 113 mmHg. She attempted to alleviate her symptoms by consuming pickles and cheese, which eventually helped her feel better. Her blood pressure varies, with readings such as 128/58 mmHg and 133/59 mmHg, and her pulse has been consistently low, around the 40s.  She has been using a CPAP machine since the end of March for her sleep apnea. Although she sleeps longer, she is unsure if the quality of her sleep has improved. The CPAP machine sometimes wakes her up, affecting her sleep quality. She is considering switching to a nasal mask to improve her comfort and sleep.  She is currently on three medications for hypertension but reports that her blood pressure readings vary. She avoids adding salt to her diet and has made dietary improvements, although she occasionally consumes high-sodium foods like ramen and chicken broth when unwell.  She reports feeling tired and attributes this partly to her anxiety medication. Despite her fatigue, she is trying to increase her physical activity by walking more and doing yard work. She goes to the gym at least once a week with her daughter, focusing on exercises to improve knee stability. No lightheadedness or dizziness, but she feels unwell during episodes of low blood pressure.  Previous antihypertensives:    Past Medical History:  Diagnosis Date   Allergy    Anxiety    Arthritis    Asthma    Cancer (HCC) 01/2024  DCIS.left breast   COVID-19    Depression    Dyspnea    Gallbladder sludge 2025   Hypertension    Obesity    PONV (postoperative nausea and vomiting)    slow to wake up after gallbladder   Pre-diabetes    Sleep apnea 2025   cpap    Past Surgical History:  Procedure Laterality Date   BREAST BIOPSY Left 02/12/2024   MM LT BREAST BX W LOC DEV 1ST LESION  IMAGE BX SPEC STEREO GUIDE 02/12/2024 [facility]   BREAST BIOPSY  02/22/2024   MM LT BREAST SAVI/RF TAG 1ST LESION MAMMO GUIDE 02/22/2024 [facility]   CHOLECYSTECTOMY     COLONOSCOPY     DENTAL SURGERY     FOOT SURGERY     right, mortons neuroma rescetion   FOOT SURGERY     right plantar fascia torn   Knee arthoscopy Left 2025   NECK SURGERY     laser surgery    Current Medications: Current Meds  Medication Sig   amLODipine  (NORVASC ) 10 MG tablet Take 1 tablet (10 mg total) by mouth daily.   B Complex Vitamins (B COMPLEX PO) Take 1 tablet by mouth daily.   busPIRone  (BUSPAR ) 10 MG tablet Take 10 mg by mouth daily.   Calcium  Carbonate+Vitamin D  (CALCIUM  600+D3) 600-200 MG-UNIT TABS Take 1 tablet by mouth in the morning and at bedtime. (Patient taking differently: Take 1 tablet by mouth daily.)   cetirizine (ZYRTEC) 10 MG tablet Take 10 mg by mouth daily as needed for allergies.   Cholecalciferol  (VITAMIN D3) 1.25 MG (50000 UT) CAPS TAKE 1 TABLET BY MOUTH ONCE A WEEK.   escitalopram  (LEXAPRO ) 20 MG tablet TAKE 1 TABLET BY MOUTH EVERY DAY   hydrochlorothiazide  (HYDRODIURIL ) 25 MG tablet Take 1 tablet (25 mg total) by mouth daily.   montelukast (SINGULAIR) 10 MG tablet Take 10 mg by mouth at bedtime as needed (allergies).   PROAIR HFA 108 (90 Base) MCG/ACT inhaler Inhale 1-2 puffs into the lungs every 4 (four) hours as needed for wheezing or shortness of breath.   spironolactone  (ALDACTONE ) 25 MG tablet Take 1 tablet (25 mg total) by mouth daily.   valsartan  (DIOVAN ) 320 MG tablet TAKE 1 TABLET BY MOUTH EVERY DAY   [DISCONTINUED] acetaminophen  (TYLENOL ) 500 MG tablet Take 2 tablets (1,000 mg total) by mouth every 6 (six) hours as needed for mild pain (pain score 1-3).   [DISCONTINUED] ibuprofen  (ADVIL ) 600 MG tablet Take 1 tablet (600 mg total) by mouth every 8 (eight) hours as needed for moderate pain (pain score 4-6).     Allergies:   Prozac [fluoxetine] and Azithromycin    Social History   Socioeconomic History   Marital status: Divorced    Spouse name: Not on file   Number of children: 2   Years of education: some college   Highest education level: Some college, no degree  Occupational History   Not on file  Tobacco Use   Smoking status: Never    Passive exposure: Never   Smokeless tobacco: Never  Vaping Use   Vaping status: Never Used  Substance and Sexual Activity   Alcohol use: Yes    Comment: rarely   Drug use: No   Sexual activity: Not Currently  Other Topics Concern   Not on file  Social History Narrative   05/06/20   From: [place]   Living: oldest daughter Jordis)   Work: Production assistant, radio and [employer]      Family: 2 children -  Sharyne and Lauraine      Enjoys: read, yardwork      Exercise: parttime job - steps at work   Diet: working with obesity specialist to try to reduce appetite      Safety   Seat belts: Yes    Guns: Yes  and secure   Safe in relationships: Yes    Social Drivers of Corporate investment banker Strain: Low Risk  (02/19/2024)   Overall Financial Resource Strain (CARDIA)    Difficulty of Paying Living Expenses: Not very hard  Recent Concern: Financial Resource Strain - Medium Risk (02/19/2024)   Overall Financial Resource Strain (CARDIA)    Difficulty of Paying Living Expenses: Somewhat hard  Food Insecurity: No Food Insecurity (02/19/2024)   Hunger Vital Sign    Worried About Running Out of Food in the Last Year: Never true    Ran Out of Food in the Last Year: Never true  Transportation Needs: No Transportation Needs (02/26/2024)   PRAPARE - Administrator, Civil Service (Medical): No    Lack of Transportation (Non-Medical): No  Physical Activity: Unknown (09/13/2023)   Exercise Vital Sign    Days of Exercise per Week: 0 days    Minutes of Exercise per Session: Not on file  Recent Concern: Physical Activity - Inactive (09/13/2023)   Exercise Vital Sign    Days of Exercise per Week: 0 days     Minutes of Exercise per Session: 20 min  Stress: No Stress Concern Present (02/19/2024)   [employer] of Occupational Health - Occupational Stress Questionnaire    Feeling of Stress: Not at all  Social Connections: Unknown (09/13/2023)   Social Connection and Isolation Panel    Frequency of Communication with Friends and Family: More than three times a week    Frequency of Social Gatherings with Friends and Family: Twice a week    Attends Religious Services: Patient declined    Database administrator or Organizations: No    Attends Engineer, structural: Not on file    Marital Status: Patient declined     Family History: The patient's family history includes Breast cancer (age of onset: 59) in her mother; Diabetes in her father; Hypertension in her father; Skin cancer in her maternal grandfather; Stroke in her maternal aunt.  ROS:   Please see the history of present illness.     All other systems reviewed and are negative.  EKGs/Labs/Other Studies Reviewed:    EKG:  EKG is not ordered today.   05-10-23: Sinus rhythm.  Rate 80 bpm.  Recent Labs: 09/14/2023: TSH 1.13 02/20/2024: ALT 38; BUN 14; Creatinine 0.68; Hemoglobin 14.0; Platelet Count 268; Potassium 4.2; Sodium 138   Recent Lipid Panel    Component Value Date/Time   CHOL 156 09/14/2023 0750   CHOL 186 07/17/2019 0844   TRIG 140.0 09/14/2023 0750   HDL 42.80 09/14/2023 0750   HDL 54 07/17/2019 0844   CHOLHDL 4 09/14/2023 0750   VLDL 28.0 09/14/2023 0750   LDLCALC 86 09/14/2023 0750   LDLCALC 114 (H) 07/17/2019 0844    Physical Exam:   VS:  BP 132/78 (BP Location: Left Arm, Patient Position: Sitting, Cuff Size: Normal)   Pulse 88   Ht 5' 6 (1.676 m)   Wt 256 lb (116.1 kg)   LMP 04/06/2017   SpO2 97%   BMI 41.32 kg/m  , BMI Body mass index is 41.32 kg/m. GENERAL:  Well appearing HEENT: Pupils equal round  and reactive, fundi not visualized, oral mucosa unremarkable NECK:  No jugular venous distention,  waveform within normal limits, carotid upstroke brisk and symmetric, no bruits, no thyromegaly LUNGS:  Clear to auscultation bilaterally HEART:  RRR.  PMI not displaced or sustained,S1 and S2 within normal limits, no S3, no S4, no clicks, no rubs, no murmurs ABD:  Flat, positive bowel sounds normal in frequency in pitch, no bruits, no rebound, no guarding, no midline pulsatile mass, no hepatomegaly, no splenomegaly EXT:  2 plus pulses throughout, no edema, no cyanosis no clubbing SKIN:  No rashes no nodules NEURO:  Cranial nerves II through XII grossly intact, motor grossly intact throughout PSYCH:  Cognitively intact, oriented to person place and time   ASSESSMENT/PLAN:    Assessment & Plan # Hypertension Blood pressure uncontrolled despite three medications, with episodes of hypotension and bradycardia. Potential aldosterone overproduction considered. - Check renin and aldosterone levels. - Investigate insurance coverage for combination pill of amlodipine , valsartan , and hydrochlorothiazide . - Add spironolactone  after lab results. - Encouraged continued use of CPAP for sleep apnea management. - Encouraged regular exercise and dietary modifications.  # Bradycardia Episodes of low heart rate, possibly correlated with hypotensive episodes. Differential includes potential arrhythmia such as AFib. - Post-surgery, initiate 30-day heart monitor to assess for arrhythmias.  # Sleep apnea Using CPAP since March with improved sleep duration but not quality. Considering change to nasal mask for better comfort and effectiveness. - Consider switching to nasal mask for CPAP. - Continue CPAP therapy.  # Breast cancer Scheduled for lumpectomy next week. Early detection with promising prognosis. - Proceed with lumpectomy as scheduled. - Monitor post-operative recovery.  Screening for Secondary Hypertension:     10/23/2023   11:14 AM  Causes  Drugs/Herbals Screened     - Comments limits sodium  intake.  1-2 coffees daily.  Rare EtOH. not smoking history.  Renovascular HTN Screened  Sleep Apnea Screened     - Comments sleep study pending  Thyroid  Disease Screened  Hyperaldosteronism Screened     - Comments check renin/aldo  Pheochromocytoma N/A  Cushing's Syndrome Screened     - Comments check cortisol  Hyperparathyroidism Screened  Coarctation of the Aorta Screened     - Comments BP symmetric  Compliance Screened    Relevant Labs/Studies:    Latest Ref Rng & Units 02/20/2024    8:17 AM 11/14/2023   11:27 AM 05/09/2023    9:22 AM  Basic Labs  Sodium 135 - 145 mmol/L 138  139  136   Potassium 3.5 - 5.1 mmol/L 4.2  3.7  4.3   Creatinine 0.44 - 1.00 mg/dL 9.31  9.46  9.39        Latest Ref Rng & Units 09/14/2023    7:50 AM 03/23/2021    3:21 PM  Thyroid    TSH 0.35 - 5.50 uIU/mL 1.13  1.93    Disposition:    FU with MD/PharmD in 2 months   Medication Adjustments/Labs and Tests Ordered: Current medicines are reviewed at length with the patient today.  Concerns regarding medicines are outlined above.  Orders Placed This Encounter  Procedures   Aldosterone + renin activity w/ ratio   Basic metabolic panel with GFR   CARDIAC EVENT MONITOR   Meds ordered this encounter  Medications   spironolactone  (ALDACTONE ) 25 MG tablet    Sig: Take 1 tablet (25 mg total) by mouth daily.    Dispense:  90 tablet    Refill:  3  Signed, Annabella Scarce, MD  02/28/2024 5:57 PM    [facility]

## 2024-02-20 NOTE — Progress Notes (Signed)
 REFERRING PROVIDER: Babara Call, MD [address] [place],  [place] [address]  PRIMARY PROVIDER:  Corwin Antu, FNP  PRIMARY REASON FOR VISIT:  1. Ductal carcinoma in situ (DCIS) of left breast   2. Family history of breast cancer    I connected with Amy Watson on 02/20/2024 at 10:00 AM EDT by MyChart video conference and verified that I am speaking with the correct person using two identifiers.    Patient location: home Provider location: [facility]   HISTORY OF PRESENT ILLNESS:   Amy Watson, a 53 y.o. female, was seen for a [place] cancer genetics consultation at the request of Dr. Babara due to a personal and family history of breast cancer.  Amy Watson presents to clinic today to discuss the possibility of a hereditary predisposition to cancer, genetic testing, and to further clarify her future cancer risks, as well as potential cancer risks for family members.   CANCER HISTORY:  Oncology History  Ductal carcinoma in situ (DCIS) of left breast  02/01/2024 Mammogram   Bilateral screening mammogram showed left breast calcifications warrant further evaluation.   02/08/2024 Imaging   LEFT breast 8 mm group of coarse heterogeneous calcification in the upper-outer quadrant is indeterminate. Recommend further assessment with stereotactic guided biopsy.   02/19/2024 Initial Diagnosis   Ductal carcinoma in situ (DCIS) of left breast  Patient underwent left breast needle core biopsy of upper outer posterior depth calcifications.  Pathology showed DCIS, intermediate to high-grade with necrosis and calcifications.  Columnar cell change and apocrine metaplasia.  Menarche at age of 69 or 6 First live birth at age of 43 OCP use: >5 years,  Menopausal status: LMP over 1 year ago History of HRT use: No History of chest radiation: No Number of previous breast biopsies:  No Family history is positive for breast cancer.   02/19/2024 Cancer Staging   Staging form: Breast, AJCC 8th  Edition - Clinical stage from 02/19/2024: Stage 0 (cTis (DCIS), cN0, cM0, ER+, PR: Not Assessed, HER2: Not Assessed) - Signed by Babara Call, MD on 02/19/2024 Stage prefix: Initial diagnosis Nuclear grade: G2    In 2025, at the age of 26, Amy Watson was diagnosed with DCIS of the left breast. The treatment plan includes lumpectomy scheduled for 02/29/2024, adjuvant radiation and adjuvant endocrine therapy. Amy Watson reports she would not change her current surgical decision based on genetic testing.  She did undergo research testing through [facility] that was normal, however this test is not comprehensive and she would still qualify for further genetic testing.    RISK FACTORS:  Menarche was at age 50-12.  First live birth at age 84.  Ovaries intact: yes.  Hysterectomy: no.  Menopausal status: perimenopausal.  HRT use: 0 years. Colonoscopy: yes; normal.  Past Medical History:  Diagnosis Date   Allergy    Anxiety    Arthritis    Asthma    COVID-19    Depression    Dyspnea    Gallbladder sludge 2025   Hypertension    Obesity    PONV (postoperative nausea and vomiting)    Pre-diabetes     Past Surgical History:  Procedure Laterality Date   BREAST BIOPSY Left 02/12/2024   MM LT BREAST BX W LOC DEV 1ST LESION IMAGE BX SPEC STEREO GUIDE 02/12/2024 [facility]   CHOLECYSTECTOMY     COLONOSCOPY     DENTAL SURGERY     FOOT SURGERY     right, mortons neuroma rescetion  FOOT SURGERY     right plantar fascia torn   Knee arthoscopy Left 2025   NECK SURGERY     laser surgery    FAMILY HISTORY:  We obtained a detailed, 4-generation family history.  Significant diagnoses are listed below: Family History  Problem Relation Age of Onset   Breast cancer Mother 44   Diabetes Father    Hypertension Father    Stroke Maternal Aunt    Skin cancer Maternal Grandfather    Amy Watson has 2 daughters, 10 an 8. She has 1 brother, 79.   Amy Watson mother had breast cancer  at 52 and is unsure if she ever had genetic testing, she is living at 65. No other known cancers on this side of the family.  Amy Watson father is living at 64. Patient's paternal grandfather had skin cancer, unknown type. No other known cancers on this side of the family.  Amy Watson is unaware of previous family history of genetic testing for hereditary cancer risks. There is no reported Ashkenazi Jewish ancestry. There is no known consanguinity.    GENETIC COUNSELING ASSESSMENT: Amy Watson is a 53 y.o. female with a personal and family history of breast cancer which is somewhat suggestive of a hereditary cancer syndrome and predisposition to cancer. We, therefore, discussed and recommended the following at today's visit.   DISCUSSION: We discussed that approximately 10% of breast cancer is hereditary. Most cases of hereditary breast cancer are associated with BRCA1/BRCA2 genes, although there are other genes associated with hereditary cancer as well. Cancers and risks are gene specific. We discussed that testing is beneficial for several reasons including knowing about cancer risks, identifying potential screening and risk-reduction options that may be appropriate, and to understand if other family members could be at risk for cancer and allow them to undergo genetic testing.   We reviewed the characteristics, features and inheritance patterns of hereditary cancer syndromes. We also discussed genetic testing, including the appropriate family members to test, the process of testing, insurance coverage and turn-around-time for results. We discussed the implications of a negative, positive and/or variant of uncertain significant result. We recommended Amy Watson pursue genetic testing for the [facility] BRCAPlus+CancerNext+RNA gene panel.   The [employer] Panel includes sequencing, rearrangement analysis, and RNA analysis for the following 13 genes: ATM, BARD1, BRCA1, BRCA2, CDH1, CHEK2, NF1,  PALB2, PTEN, RAD51C, RAD51D, STK11 and TP53 (sequencing and deletion/duplication).  The [facility] CancerNext+RNAinsight Panel includes sequencing, rearrangement analysis, and RNA analysis for the following 40 genes: APC, ATM, BAP1, BARD1, BMPR1A, BRCA1, BRCA2, BRIP1, CDH1, CDKN2A, CHEK2, FH, FLCN, MET, MLH1, MSH2, MSH6, MUTYH, NF1, NTHL1, PALB2, PMS2, PTEN, RAD51C, RAD51D, RPS20, SMAD4, STK11, TP53, TSC1, TSC2, and VHL (sequencing and deletion/duplication); AXIN2, HOXB13, MBD4, MSH3, POLD1 and POLE (sequencing only); EPCAM and GREM1 (deletion/duplication only).  Based on Amy Watson's personal and family history of cancer, she meets medical criteria for genetic testing. Despite that she meets criteria, she may still have an out of pocket cost.   PLAN: After considering the risks, benefits, and limitations, Amy Watson provided informed consent to pursue genetic testing and the blood sample was sent to [facility] for analysis of the BRCAPlus+CancerNext+RNA panel. Results should be available within approximately 1 weeks' time, at which point they will be disclosed by telephone to Amy Watson, as will any additional recommendations warranted by these results. Amy Watson will receive a summary of her genetic counseling visit and a copy of her results once available. This information will  also be available in Epic.   Amy Watson questions were answered to her satisfaction today. Our contact information was provided should additional questions or concerns arise. Thank you for the referral and allowing us  to share in the care of your patient.   Dena Cary, MS, [facility] Genetic Counselor [email or web address] Phone: [phone]  40 minutes were spent on the date of the encounter in service to the patient including preparation, face-to-face consultation, documentation and care coordination. Dr. Delinda was available for discussion regarding this case.    _______________________________________________________________________ For Office Staff:  Number of people involved in session: 1 Was an Intern/ student involved with case: no

## 2024-02-20 NOTE — Patient Instructions (Addendum)
 Medication Instructions:  AFTER YOU HAVE YOUR LABS START SPIRONOLACTONE  25 MG DAILY   Labwork: RENIN/ALDOSTERONE SOON, NEEDS TO BE DONE FIRST THING IN THE MORNING   BMET 1 TO 2 WEEKS AFTER STARTING SPIRONOLACTONE    Testing/Procedures: 30 DAY EVENT MONITOR, THIS WILL BE MAILED TO YOU   Follow-Up: 2 MONTHS WITH CAITLIN W NP OR DR [place],   Any Other Special Instructions Will Be Listed Below (If Applicable).  [employer] Cardiac Event Monitor Instructions  Your physician has requested you wear your cardiac event monitor for _30_ days, (1-30). [employer] may call or text to confirm a shipping address. The monitor will be sent to a land address via UPS. [employer] will not ship a monitor to a PO BOX. It typically takes 3-5 days to receive your monitor after it has been enrolled. [employer] will assist with USPS tracking if your package is delayed. The telephone number for [employer] is [phone]. Once you have received your monitor, please review the enclosed instructions. Instruction tutorials can also be viewed under help and settings on the enclosed cell phone. Your monitor has already been registered assigning a specific monitor serial # to you.  Billing and Self Pay Discount Information  [employer] has been provided the insurance information we had on file for you.  If your insurance has been updated, please call [employer] at [phone] to provide them with your updated insurance information.   [employer] offers a discounted Self Pay option for patients who have insurance that does not cover their cardiac event monitor or patients without insurance.  The discounted cost of a Self Pay Cardiac Event Monitor would be $225.00 , if the patient contacts [employer] at [phone] within 7 days of applying the monitor to make payment arrangements.  If the patient does not contact [employer] within 7 days of applying the monitor, the cost of the cardiac event monitor will be  $350.00.  Applying the monitor  Remove cell phone from case and turn it on. The cell phone works as IT consultant and needs to be within [employer] of you at all times. The cell phone will need to be charged on a daily basis. We recommend you plug the cell phone into the enclosed charger at your bedside table every night.  Monitor batteries: You will receive two monitor batteries labelled #1 and #2. These are your recorders. Plug battery #2 onto the second connection on the enclosed charger. Keep one battery on the charger at all times. This will keep the monitor battery deactivated. It will also keep it fully charged for when you need to switch your monitor batteries. A small light will be blinking on the battery emblem when it is charging. The light on the battery emblem will remain on when the battery is fully charged.  Open package of a Monitor strip. Insert battery #1 into black hood on strip and gently squeeze monitor battery onto connection as indicated in instruction booklet. Set aside while preparing skin.  Choose location for your strip, vertical or horizontal, as indicated in the instruction booklet. Shave to remove all hair from location. There cannot be any lotions, oils, powders, or colognes on skin where monitor is to be applied. Wipe skin clean with enclosed Saline wipe. Dry skin completely.  Peel paper labeled #1 off the back of the Monitor strip exposing the adhesive. Place the monitor on the chest in the vertical or horizontal position shown in the instruction booklet. One arrow on the monitor strip must be pointing upward. Carefully remove  paper labeled #2, attaching remainder of strip to your skin. Try not to create any folds or wrinkles in the strip as you apply it.  Firmly press and release the circle in the center of the monitor battery. You will hear a small beep. This is turning the monitor battery on. The heart emblem on the monitor battery will light up every 5  seconds if the monitor battery in turned on and connected to the patient securely. Do not push and hold the circle down as this turns the monitor battery off. The cell phone will locate the monitor battery. A screen will appear on the cell phone checking the connection of your monitor strip. This may read poor connection initially but change to good connection within the next minute. Once your monitor accepts the connection you will hear a series of 3 beeps followed by a climbing crescendo of beeps. A screen will appear on the cell phone showing the two monitor strip placement options. Touch the picture that demonstrates where you applied the monitor strip.  Your monitor strip and battery are waterproof. You are able to shower, bathe, or swim with the monitor on. They just ask you do not submerge deeper than 3 feet underwater. We recommend removing the monitor if you are swimming in a lake, river, or ocean.  Your monitor battery will need to be switched to a fully charged monitor battery approximately once a week. The cell phone will alert you of an action which needs to be made.  On the cell phone, tap for details to reveal connection status, monitor battery status, and cell phone battery status. The green dots indicates your monitor is in good status. A red dot indicates there is something that needs your attention.  To record a symptom, click the circle on the monitor battery. In 30-60 seconds a list of symptoms will appear on the cell phone. Select your symptom and tap save. Your monitor will record a sustained or significant arrhythmia regardless of you clicking the button. Some patients do not feel the heart rhythm irregularities. [employer] will notify us  of any serious or critical events.  Refer to instruction booklet for instructions on switching batteries, changing strips, the Do not disturb or Pause features, or any additional questions.  Call [employer] at [phone], to  confirm your monitor is transmitting and record your baseline. They will answer any questions you may have regarding the monitor instructions at that time.  Returning the monitor to [employer]  Place all equipment back into blue box. Peel off strip of paper to expose adhesive and close box securely. There is a prepaid UPS shipping label on this box. Drop in a UPS drop box, or at a UPS facility like [employer]. You may also contact [employer] to arrange UPS to pick up monitor package at your home.

## 2024-02-20 NOTE — Progress Notes (Signed)
 Lumpectomy is scheduled for 7/17.  She will see Dr. Babara and Dr. Lenn on 8/4.  Appt. Details given to her.

## 2024-02-21 ENCOUNTER — Encounter: Payer: Self-pay | Admitting: Family

## 2024-02-21 LAB — FOLLICLE STIMULATING HORMONE: FSH: 31.2 m[IU]/mL

## 2024-02-21 LAB — ESTRADIOL: Estradiol: 19.2 pg/mL

## 2024-02-22 ENCOUNTER — Encounter: Payer: Self-pay | Admitting: *Deleted

## 2024-02-22 ENCOUNTER — Ambulatory Visit
Admission: RE | Admit: 2024-02-22 | Discharge: 2024-02-22 | Disposition: A | Discharge status: Home / Self Care | Source: Ambulatory Visit | Attending: Surgery | Admitting: Surgery

## 2024-02-22 DIAGNOSIS — I10 Essential (primary) hypertension: Secondary | ICD-10-CM | POA: Diagnosis not present

## 2024-02-22 DIAGNOSIS — D0502 Lobular carcinoma in situ of left breast: Secondary | ICD-10-CM | POA: Diagnosis not present

## 2024-02-22 DIAGNOSIS — D0512 Intraductal carcinoma in situ of left breast: Secondary | ICD-10-CM | POA: Diagnosis not present

## 2024-02-22 HISTORY — PX: BREAST BIOPSY: SHX20

## 2024-02-22 MED ORDER — LIDOCAINE HCL 1 % IJ SOLN
10.0000 mL | Freq: Once | INTRAMUSCULAR | Status: AC
  Administered 2024-02-22: 10 mL
  Filled 2024-02-22: qty 10

## 2024-02-22 NOTE — Progress Notes (Signed)
 Received a call from Ms. Beckum asking about her critical illness paperwork.   The paperwork has been received and is being filled out.  It should be faxed in the next couple of days.

## 2024-02-23 ENCOUNTER — Encounter
Admission: RE | Admit: 2024-02-23 | Discharge: 2024-02-23 | Disposition: A | Discharge status: Home / Self Care | Source: Ambulatory Visit | Attending: Surgery

## 2024-02-23 ENCOUNTER — Other Ambulatory Visit: Payer: Self-pay

## 2024-02-23 DIAGNOSIS — R9431 Abnormal electrocardiogram [ECG] [EKG]: Secondary | ICD-10-CM | POA: Insufficient documentation

## 2024-02-23 DIAGNOSIS — I10 Essential (primary) hypertension: Secondary | ICD-10-CM | POA: Diagnosis not present

## 2024-02-23 DIAGNOSIS — Z01810 Encounter for preprocedural cardiovascular examination: Secondary | ICD-10-CM | POA: Diagnosis not present

## 2024-02-23 DIAGNOSIS — Z01818 Encounter for other preprocedural examination: Secondary | ICD-10-CM | POA: Diagnosis not present

## 2024-02-23 NOTE — Patient Instructions (Addendum)
 Your procedure is scheduled on: 02/29/24 - Thursday Report to the Registration Desk on the 1st floor of the [facility]. To find out your arrival time, please call [phone] between 1PM - 3PM on: 02/28/24 - Wednesday If your arrival time is 6:00 am, do not arrive before that time as the [facility] entrance doors do not open until 6:00 am.  REMEMBER: Instructions that are not followed completely may result in serious medical risk, up to and including death; or upon the discretion of your surgeon and anesthesiologist your surgery may need to be rescheduled.  Do not eat food after midnight the night before surgery.  No gum chewing or hard candies.  You may however, drink CLEAR liquids up to 2 hours before you are scheduled to arrive for your surgery. Do not drink anything within 2 hours of your scheduled arrival time.  Clear liquids include: - water   - apple juice without pulp - gatorade (not RED colors) - black coffee or tea (Do NOT add milk or creamers to the coffee or tea) Do NOT drink anything that is not on this list.   One week prior to surgery: Stop Anti-inflammatories (NSAIDS) such as Advil , Aleve, Ibuprofen , Motrin , Naproxen, Naprosyn and Aspirin based products such as Excedrin, Goody's Powder, BC Powder. You may take Tylenol  if needed for pain up until the day of surgery.  Stop ANY OVER THE COUNTER supplements until after surgery : B Complex ,CALCIUM  600+D3 .  Continue taking all of your other prescription medications up until the day before surgery.  ON THE DAY OF SURGERY ONLY TAKE THESE MEDICATIONS WITH SIPS OF WATER : none   Use inhaler PROAIR HFA on the day of surgery and bring to the hospital.   No Alcohol for 24 hours before or after surgery.  No Smoking including e-cigarettes for 24 hours before surgery.  No chewable tobacco products for at least 6 hours before surgery.  No nicotine patches on the day of surgery.  Do not use any recreational drugs for at  least a week (preferably 2 weeks) before your surgery.  Please be advised that the combination of cocaine and anesthesia may have negative outcomes, up to and including death. If you test positive for cocaine, your surgery will be cancelled.  On the morning of surgery brush your teeth with toothpaste and water , you may rinse your mouth with mouthwash if you wish. Do not swallow any toothpaste or mouthwash.  Use CHG Soap or wipes as directed on instruction sheet.  Do not wear jewelry, make-up, hairpins, clips or nail polish.  For welded (permanent) jewelry: bracelets, anklets, waist bands, etc.  Please have this removed prior to surgery.  If it is not removed, there is a chance that hospital personnel will need to cut it off on the day of surgery.  Do not wear lotions, powders, or perfumes.   Do not shave body hair from the neck down 48 hours before surgery.  Contact lenses, hearing aids and dentures may not be worn into surgery.  Do not bring valuables to the hospital. [facility] is not responsible for any missing/lost belongings or valuables.   Notify your doctor if there is any change in your medical condition (cold, fever, infection).  Wear comfortable clothing (specific to your surgery type) to the hospital.  After surgery, you can help prevent lung complications by doing breathing exercises.  Take deep breaths and cough every 1-2 hours. Your doctor may order a device called an Facilities manager to  help you take deep breaths.  When coughing or sneezing, hold a pillow firmly against your incision with both hands. This is called "splinting." Doing this helps protect your incision. It also decreases belly discomfort.  If you are being admitted to the hospital overnight, leave your suitcase in the car. After surgery it may be brought to your room.  In case of increased patient census, it may be necessary for you, the patient, to continue your postoperative care in the Same Day  Surgery department.  If you are being discharged the day of surgery, you will not be allowed to drive home. You will need a responsible individual to drive you home and stay with you for 24 hours after surgery.   If you are taking public transportation, you will need to have a responsible individual with you.  Please call the Pre-admissions Testing Dept. at [phone] if you have any questions about these instructions.  Surgery Visitation Policy:  Patients having surgery or a procedure may have two visitors.  Children under the age of 53 must have an adult with them who is not the patient.  Inpatient Visitation:    Visiting hours are 7 a.m. to 8 p.m. Up to four visitors are allowed at one time in a patient room. The visitors may rotate out with other people during the day.  One visitor age 29 or older may stay with the patient overnight and must be in the room by 8 p.m.   Merchandiser, retail to address health-related social needs:  [email or web address]     Preparing for Surgery with CHLORHEXIDINE  GLUCONATE (CHG) Soap  Chlorhexidine  Gluconate (CHG) Soap  o An antiseptic cleaner that kills germs and bonds with the skin to continue killing germs even after washing  o Used for showering the night before surgery and morning of surgery  Before surgery, you can play an important role by reducing the number of germs on your skin.  CHG (Chlorhexidine  gluconate) soap is an antiseptic cleanser which kills germs and bonds with the skin to continue killing germs even after washing.  Please do not use if you have an allergy to CHG or antibacterial soaps. If your skin becomes reddened/irritated stop using the CHG.  1. Shower the NIGHT BEFORE SURGERY and the MORNING OF SURGERY with CHG soap.  2. If you choose to wash your hair, wash your hair first as usual with your normal shampoo.  3. After shampooing, rinse your hair and body thoroughly to remove the  shampoo.  4. Use CHG as you would any other liquid soap. You can apply CHG directly to the skin and wash gently with a scrungie or a clean washcloth.  5. Apply the CHG soap to your body only from the neck down. Do not use on open wounds or open sores. Avoid contact with your eyes, ears, mouth, and genitals (private parts). Wash face and genitals (private parts) with your normal soap.  6. Wash thoroughly, paying special attention to the area where your surgery will be performed.  7. Thoroughly rinse your body with warm water .  8. Do not shower/wash with your normal soap after using and rinsing off the CHG soap.  9. Pat yourself dry with a clean towel.  10. Wear clean pajamas to bed the night before surgery.  12. Place clean sheets on your bed the night of your first shower and do not sleep with pets.  13. Shower again with the CHG soap on the day of surgery prior to  arriving at the hospital.  14. Do not apply any deodorants/lotions/powders.  15. Please wear clean clothes to the hospital.

## 2024-02-27 ENCOUNTER — Ambulatory Visit: Payer: Self-pay | Admitting: Licensed Clinical Social Worker

## 2024-02-27 ENCOUNTER — Encounter: Payer: Self-pay | Admitting: Licensed Clinical Social Worker

## 2024-02-27 ENCOUNTER — Telehealth: Payer: Self-pay | Admitting: Licensed Clinical Social Worker

## 2024-02-27 ENCOUNTER — Ambulatory Visit: Payer: Self-pay | Admitting: Cardiovascular Disease

## 2024-02-27 DIAGNOSIS — Z1379 Encounter for other screening for genetic and chromosomal anomalies: Secondary | ICD-10-CM

## 2024-02-27 LAB — ALDOSTERONE + RENIN ACTIVITY W/ RATIO
Aldos/Renin Ratio: 0.6 (ref 0.0–30.0)
Aldosterone: 14.7 ng/dL (ref 0.0–30.0)
Renin Activity, Plasma: 25.047 ng/mL/h — ABNORMAL HIGH (ref 0.167–5.380)

## 2024-02-27 NOTE — Progress Notes (Signed)
 HPI:   Amy Watson was previously seen in the [facility] due to a personal and family history of cancer and concerns regarding a hereditary predisposition to cancer. Please refer to our prior cancer genetics clinic note for more information regarding our discussion, assessment and recommendations, at the time. Amy Watson recent genetic test results were disclosed to her, as were recommendations warranted by these results. These results and recommendations are discussed in more detail below.  CANCER HISTORY:  Oncology History  Ductal carcinoma in situ (DCIS) of left breast  02/01/2024 Mammogram   Bilateral screening mammogram showed left breast calcifications warrant further evaluation.   02/08/2024 Imaging   LEFT breast 8 mm group of coarse heterogeneous calcification in the upper-outer quadrant is indeterminate. Recommend further assessment with stereotactic guided biopsy.   02/19/2024 Initial Diagnosis   Ductal carcinoma in situ (DCIS) of left breast  Patient underwent left breast needle core biopsy of upper outer posterior depth calcifications.  Pathology showed DCIS, intermediate to high-grade with necrosis and calcifications.  Columnar cell change and apocrine metaplasia.  Menarche at age of 89 or 21 First live birth at age of 37 OCP use: >5 years,  Menopausal status: LMP over 1 year ago History of HRT use: No History of chest radiation: No Number of previous breast biopsies:  No Family history is positive for breast cancer.   02/19/2024 Cancer Staging   Staging form: Breast, AJCC 8th Edition - Clinical stage from 02/19/2024: Stage 0 (cTis (DCIS), cN0, cM0, ER+, PR: Not Assessed, HER2: Not Assessed) - Signed by Babara Call, MD on 02/19/2024 Stage prefix: Initial diagnosis Nuclear grade: G2    Genetic Testing   No pathogenic variants identified on the [facility] BRCAPlus + CancerNext+RNA panel. 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 The report date is 02/27/2024.  The  [employer] Panel includes sequencing, rearrangement analysis, and RNA analysis for the following 13 genes: ATM, BARD1, BRCA1, BRCA2, CDH1, CHEK2, NF1, PALB2, PTEN, RAD51C, RAD51D, STK11 and TP53 (sequencing and deletion/duplication).  The [employer] CancerNext+RNAinsight Panel includes sequencing, rearrangement analysis, and RNA analysis for the following 40 genes: APC, ATM, BAP1, BARD1, BMPR1A, BRCA1, BRCA2, BRIP1, CDH1, CDKN2A, CHEK2, FH, FLCN, MET, MLH1, MSH2, MSH6, MUTYH, NF1, NTHL1, PALB2, PMS2, PTEN, RAD51C, RAD51D, RPS20, SMAD4, STK11, TP53, TSC1, TSC2, and VHL (sequencing and deletion/duplication); AXIN2, HOXB13, MBD4, MSH3, POLD1 and POLE (sequencing only); EPCAM and GREM1 (deletion/duplication only).     FAMILY HISTORY:  We obtained a detailed, 4-generation family history.  Significant diagnoses are listed below: Family History  Problem Relation Age of Onset   Breast cancer Mother 80   Diabetes Father    Hypertension Father    Stroke Maternal Aunt    Skin cancer Maternal Grandfather    Amy Watson 2 daughters, 9 an 51. She Watson 1 brother, 66.    Amy Watson mother had breast cancer at 81 and is unsure if she ever had genetic testing, she is living at 30. No other known cancers on this side of the family.   Amy Watson father is living at 19. Patient's paternal grandfather had skin cancer, unknown type. No other known cancers on this side of the family.   Amy Watson is unaware of previous family history of genetic testing for hereditary cancer risks. There is no reported Ashkenazi Jewish ancestry. There is no known consanguinity.      GENETIC TEST RESULTS:  The [employer] BRCAPlus + CancerNext+RNA Panel found no pathogenic mutations.   The [employer]  Panel includes sequencing, rearrangement analysis, and RNA analysis for the following 13 genes: ATM, BARD1, BRCA1, BRCA2, CDH1, CHEK2, NF1, PALB2, PTEN, RAD51C, RAD51D, STK11 and TP53 (sequencing and  deletion/duplication).  The [employer] CancerNext+RNAinsight Panel includes sequencing, rearrangement analysis, and RNA analysis for the following 40 genes: APC, ATM, BAP1, BARD1, BMPR1A, BRCA1, BRCA2, BRIP1, CDH1, CDKN2A, CHEK2, FH, FLCN, MET, MLH1, MSH2, MSH6, MUTYH, NF1, NTHL1, PALB2, PMS2, PTEN, RAD51C, RAD51D, RPS20, SMAD4, STK11, TP53, TSC1, TSC2, and VHL (sequencing and deletion/duplication); AXIN2, HOXB13, MBD4, MSH3, POLD1 and POLE (sequencing only); EPCAM and GREM1 (deletion/duplication only).   The test report Watson been scanned into EPIC and is located under the Molecular Pathology section of the Results Review tab.  A portion of the result report is included below for reference. Genetic testing reported out on 02/27/2024.      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  At this time, it is unknown if this variant is associated with an increased risk for cancer or if it is benign, but most uncertain variants are reclassified to benign. It should not be used to make medical management decisions. With time, we suspect the laboratory will determine the significance of this variant, if any. If the laboratory reclassifies this variant, we will attempt to contact Amy Watson to discuss it further.   Even though a pathogenic variant was not identified, possible explanations for the cancer in the family may include: There may be no hereditary risk for cancer in the family. The cancers in Amy Watson and/or her family may be sporadic/familial or due to other genetic and environmental factors. There may be a gene mutation in one of these genes that current testing methods cannot detect but that chance is small. There could be another gene that Watson not yet been discovered, or that we have not yet tested, that is responsible for the cancer diagnoses in the family.  It is also possible there is a hereditary cause for the cancer in the family that [name] did not inherit Therefore, it is important to remain in touch with cancer genetics in the future so that we can continue to offer Amy Watson the most up to date genetic testing.   ADDITIONAL GENETIC TESTING:  We discussed with Amy Watson that her genetic testing was fairly extensive.  If there are additional relevant genes identified to increase cancer risk that can be analyzed in the future, we would be happy to discuss and coordinate this testing at that time.    CANCER SCREENING RECOMMENDATIONS:  Amy Watson test result is considered negative (normal).  This means that we have not identified a hereditary cause for her personal and family history of cancer at this time.   An individual's cancer risk and medical management are not determined by genetic test results alone. Overall cancer risk assessment incorporates additional factors, including personal medical history, family history, and any available genetic information that may result in a personalized plan for cancer prevention and surveillance. Therefore, it is recommended she continue to follow the cancer management and screening guidelines provided by her oncology and primary healthcare provider.  RECOMMENDATIONS FOR FAMILY MEMBERS:   Since she did not inherit a identifiable mutation in a cancer predisposition gene included on this panel, her children could not have inherited a known mutation from her in one of these genes. Individuals in this family might be at some increased risk of developing cancer, over the general population risk, due  to the family history of cancer.  Individuals in the family should notify their providers of the family history of cancer. We recommend women in this family have a yearly mammogram beginning at age 30, or 22 years younger than the earliest onset of cancer, an annual clinical breast exam, and perform monthly breast self-exams.  Family members should have colonoscopies by at age 76, or earlier, as  recommended by their providers. Other members of the family may still carry a pathogenic variant in one of these genes that Amy Watson did not inherit. Based on the family history, we recommend her mother who had breast cancer at 50 have genetic counseling and testing. Amy Watson will let us  know if we can be of any assistance in coordinating genetic counseling and/or testing for this family member.   We do not recommend familial testing for the ATM variant of uncertain significance (VUS).  FOLLOW-UP:  Lastly, we discussed with Amy Watson that cancer genetics is a rapidly advancing field and it is possible that new genetic tests will be appropriate for her and/or her family members in the future. We encouraged her to remain in contact with cancer genetics on an annual basis so we can update her personal and family histories and let her know of advances in cancer genetics that may benefit this family.   Our contact number was provided. Amy Watson questions were answered to her satisfaction, and she knows she is welcome to call us  at anytime with additional questions or concerns.    Amy Cary, MS, [facility] Genetic Counselor [email or web address] Phone: [phone]

## 2024-02-27 NOTE — Telephone Encounter (Signed)
 I contacted Ms. Schaumburg via Print production planner message to discuss her genetic testing results. No pathogenic variants were identified in the 13 genes analyzed. Remainder of genetic testing is pending and she will be contacted again once this returns. Detailed clinic note to follow.   The test report has been scanned into EPIC and is located under the Molecular Pathology section of the Results Review tab.  A portion of the result report is included below for reference.      Dena Cary, MS, [facility] Genetic Counselor [email or web address] Phone: [phone]

## 2024-02-28 ENCOUNTER — Encounter (HOSPITAL_BASED_OUTPATIENT_CLINIC_OR_DEPARTMENT_OTHER): Payer: Self-pay | Admitting: Cardiovascular Disease

## 2024-02-29 ENCOUNTER — Ambulatory Visit: Payer: Self-pay | Admitting: Urgent Care

## 2024-02-29 ENCOUNTER — Ambulatory Visit

## 2024-02-29 ENCOUNTER — Ambulatory Visit
Admission: RE | Admit: 2024-02-29 | Discharge: 2024-02-29 | Disposition: A | Discharge status: Home / Self Care | Attending: Surgery | Admitting: Surgery

## 2024-02-29 ENCOUNTER — Ambulatory Visit
Admission: RE | Admit: 2024-02-29 | Discharge: 2024-02-29 | Disposition: A | Discharge status: Home / Self Care | Source: Ambulatory Visit | Attending: Surgery | Admitting: Surgery

## 2024-02-29 ENCOUNTER — Encounter
Admission: RE | Disposition: A | Discharge status: Home / Self Care | Payer: Self-pay | Source: Home / Self Care | Attending: Surgery

## 2024-02-29 ENCOUNTER — Other Ambulatory Visit: Payer: Self-pay

## 2024-02-29 ENCOUNTER — Encounter: Payer: Self-pay | Admitting: Surgery

## 2024-02-29 DIAGNOSIS — Z6841 Body Mass Index (BMI) 40.0 and over, adult: Secondary | ICD-10-CM | POA: Diagnosis not present

## 2024-02-29 DIAGNOSIS — D0512 Intraductal carcinoma in situ of left breast: Secondary | ICD-10-CM | POA: Insufficient documentation

## 2024-02-29 DIAGNOSIS — Z1721 Progesterone receptor positive status: Secondary | ICD-10-CM | POA: Diagnosis not present

## 2024-02-29 DIAGNOSIS — F32A Depression, unspecified: Secondary | ICD-10-CM | POA: Insufficient documentation

## 2024-02-29 DIAGNOSIS — Z170 Estrogen receptor positive status [ER+]: Secondary | ICD-10-CM | POA: Insufficient documentation

## 2024-02-29 DIAGNOSIS — N649 Disorder of breast, unspecified: Secondary | ICD-10-CM | POA: Diagnosis not present

## 2024-02-29 DIAGNOSIS — F419 Anxiety disorder, unspecified: Secondary | ICD-10-CM | POA: Insufficient documentation

## 2024-02-29 DIAGNOSIS — J45909 Unspecified asthma, uncomplicated: Secondary | ICD-10-CM | POA: Insufficient documentation

## 2024-02-29 DIAGNOSIS — Z803 Family history of malignant neoplasm of breast: Secondary | ICD-10-CM | POA: Diagnosis not present

## 2024-02-29 DIAGNOSIS — Z8249 Family history of ischemic heart disease and other diseases of the circulatory system: Secondary | ICD-10-CM | POA: Diagnosis not present

## 2024-02-29 DIAGNOSIS — E66813 Obesity, class 3: Secondary | ICD-10-CM | POA: Diagnosis not present

## 2024-02-29 DIAGNOSIS — I10 Essential (primary) hypertension: Secondary | ICD-10-CM | POA: Diagnosis not present

## 2024-02-29 DIAGNOSIS — G4730 Sleep apnea, unspecified: Secondary | ICD-10-CM | POA: Insufficient documentation

## 2024-02-29 HISTORY — PX: BREAST LUMPECTOMY WITH RADIO FREQUENCY LOCALIZER: SHX6897

## 2024-02-29 SURGERY — BREAST LUMPECTOMY WITH RADIO FREQUENCY LOCALIZER
Anesthesia: General | Site: Breast | Laterality: Left

## 2024-02-29 MED ORDER — SCOPOLAMINE 1 MG/3DAYS TD PT72
MEDICATED_PATCH | TRANSDERMAL | Status: AC
  Filled 2024-02-29: qty 1

## 2024-02-29 MED ORDER — ORAL CARE MOUTH RINSE: 15.0000 mL | Freq: Once | OROMUCOSAL | Status: AC

## 2024-02-29 MED ORDER — PHENYLEPHRINE HCL-NACL 20-0.9 MG/250ML-% IV SOLN
INTRAVENOUS | Status: DC | PRN
Start: 2024-02-29 — End: 2024-02-29
  Administered 2024-02-29: 40 ug via INTRAVENOUS
  Administered 2024-02-29: 20 ug/min via INTRAVENOUS

## 2024-02-29 MED ORDER — MIDAZOLAM HCL 2 MG/2ML IJ SOLN
INTRAMUSCULAR | Status: DC | PRN
  Administered 2024-02-29: 2 mg via INTRAVENOUS

## 2024-02-29 MED ORDER — GABAPENTIN 300 MG PO CAPS
ORAL_CAPSULE | ORAL | Status: AC
  Filled 2024-02-29: qty 1

## 2024-02-29 MED ORDER — ACETAMINOPHEN 500 MG PO TABS
ORAL_TABLET | ORAL | Status: AC
  Filled 2024-02-29: qty 2

## 2024-02-29 MED ORDER — PHENYLEPHRINE 80 MCG/ML (10ML) SYRINGE FOR IV PUSH (FOR BLOOD PRESSURE SUPPORT)
PREFILLED_SYRINGE | INTRAVENOUS | Status: AC
  Filled 2024-02-29: qty 10

## 2024-02-29 MED ORDER — STERILE WATER FOR IRRIGATION IR SOLN
Status: DC | PRN
  Administered 2024-02-29: 500 mL

## 2024-02-29 MED ORDER — ACETAMINOPHEN 500 MG PO TABS: 1000.0000 mg | ORAL_TABLET | Freq: Four times a day (QID) | ORAL | Status: AC | PRN

## 2024-02-29 MED ORDER — MIDAZOLAM HCL 2 MG/2ML IJ SOLN
INTRAMUSCULAR | Status: AC
  Filled 2024-02-29: qty 2

## 2024-02-29 MED ORDER — CHLORHEXIDINE GLUCONATE CLOTH 2 % EX PADS
6.0000 | MEDICATED_PAD | Freq: Once | CUTANEOUS | Status: AC
  Administered 2024-02-29: 6 via TOPICAL

## 2024-02-29 MED ORDER — OXYCODONE HCL 5 MG PO TABS: 5.0000 mg | ORAL_TABLET | ORAL | 0 refills | Status: DC | PRN

## 2024-02-29 MED ORDER — CHLORHEXIDINE GLUCONATE 0.12 % MT SOLN
15.0000 mL | Freq: Once | OROMUCOSAL | Status: AC
  Administered 2024-02-29: 15 mL via OROMUCOSAL

## 2024-02-29 MED ORDER — LACTATED RINGERS IV SOLN: INTRAVENOUS | Status: DC

## 2024-02-29 MED ORDER — GABAPENTIN 300 MG PO CAPS
300.0000 mg | ORAL_CAPSULE | ORAL | Status: AC
  Administered 2024-02-29: 300 mg via ORAL

## 2024-02-29 MED ORDER — BUPIVACAINE-EPINEPHRINE 0.5% -1:200000 IJ SOLN
INTRAMUSCULAR | Status: DC | PRN
  Administered 2024-02-29: 30 mL

## 2024-02-29 MED ORDER — PROPOFOL 10 MG/ML IV BOLUS
INTRAVENOUS | Status: DC | PRN
  Administered 2024-02-29: 200 mg via INTRAVENOUS

## 2024-02-29 MED ORDER — SCOPOLAMINE 1 MG/3DAYS TD PT72
1.0000 | MEDICATED_PATCH | TRANSDERMAL | Status: DC
  Administered 2024-02-29: 1.5 mg via TRANSDERMAL

## 2024-02-29 MED ORDER — CEFAZOLIN SODIUM-DEXTROSE 2-4 GM/100ML-% IV SOLN
INTRAVENOUS | Status: AC
  Filled 2024-02-29: qty 100

## 2024-02-29 MED ORDER — LIDOCAINE HCL (CARDIAC) PF 100 MG/5ML IV SOSY
PREFILLED_SYRINGE | INTRAVENOUS | Status: DC | PRN
  Administered 2024-02-29: 100 mg via INTRAVENOUS

## 2024-02-29 MED ORDER — FENTANYL CITRATE (PF) 100 MCG/2ML IJ SOLN
INTRAMUSCULAR | Status: DC | PRN
  Administered 2024-02-29: 25 ug via INTRAVENOUS
  Administered 2024-02-29: 50 ug via INTRAVENOUS
  Administered 2024-02-29: 25 ug via INTRAVENOUS

## 2024-02-29 MED ORDER — EPHEDRINE 5 MG/ML INJ
INTRAVENOUS | Status: AC
  Filled 2024-02-29: qty 5

## 2024-02-29 MED ORDER — ACETAMINOPHEN 500 MG PO TABS
1000.0000 mg | ORAL_TABLET | ORAL | Status: AC
  Administered 2024-02-29: 1000 mg via ORAL

## 2024-02-29 MED ORDER — CHLORHEXIDINE GLUCONATE 0.12 % MT SOLN
OROMUCOSAL | Status: AC
  Filled 2024-02-29: qty 15

## 2024-02-29 MED ORDER — FENTANYL CITRATE (PF) 100 MCG/2ML IJ SOLN: 25.0000 ug | INTRAMUSCULAR | Status: DC | PRN

## 2024-02-29 MED ORDER — OXYCODONE HCL 5 MG/5ML PO SOLN: 5.0000 mg | Freq: Once | ORAL | Status: DC | PRN

## 2024-02-29 MED ORDER — ONDANSETRON HCL 4 MG/2ML IJ SOLN
INTRAMUSCULAR | Status: DC | PRN
Start: 2024-02-29 — End: 2024-02-29
  Administered 2024-02-29: 4 mg via INTRAVENOUS

## 2024-02-29 MED ORDER — FENTANYL CITRATE (PF) 100 MCG/2ML IJ SOLN
INTRAMUSCULAR | Status: AC
  Filled 2024-02-29: qty 2

## 2024-02-29 MED ORDER — DEXAMETHASONE SODIUM PHOSPHATE 10 MG/ML IJ SOLN
INTRAMUSCULAR | Status: DC | PRN
  Administered 2024-02-29: 10 mg via INTRAVENOUS

## 2024-02-29 MED ORDER — IBUPROFEN 600 MG PO TABS: 600.0000 mg | ORAL_TABLET | Freq: Three times a day (TID) | ORAL | 1 refills | Status: AC | PRN

## 2024-02-29 MED ORDER — BUPIVACAINE LIPOSOME 1.3 % IJ SUSP: 10.0000 mL | Freq: Once | INTRAMUSCULAR | Status: DC

## 2024-02-29 MED ORDER — CEFAZOLIN SODIUM-DEXTROSE 2-4 GM/100ML-% IV SOLN
2.0000 g | INTRAVENOUS | Status: AC
  Administered 2024-02-29: 2 g via INTRAVENOUS

## 2024-02-29 MED ORDER — KETOROLAC TROMETHAMINE 30 MG/ML IJ SOLN
INTRAMUSCULAR | Status: DC | PRN
  Administered 2024-02-29: 30 mg via INTRAVENOUS

## 2024-02-29 MED ORDER — OXYCODONE HCL 5 MG PO TABS: 5.0000 mg | ORAL_TABLET | Freq: Once | ORAL | Status: DC | PRN

## 2024-02-29 MED ORDER — DEXMEDETOMIDINE HCL IN NACL 80 MCG/20ML IV SOLN
INTRAVENOUS | Status: DC | PRN
  Administered 2024-02-29: 8 ug via INTRAVENOUS

## 2024-02-29 MED ORDER — BUPIVACAINE-EPINEPHRINE (PF) 0.5% -1:200000 IJ SOLN
INTRAMUSCULAR | Status: AC
  Filled 2024-02-29: qty 30

## 2024-02-29 MED ORDER — EPHEDRINE SULFATE-NACL 50-0.9 MG/10ML-% IV SOSY
PREFILLED_SYRINGE | INTRAVENOUS | Status: DC | PRN
  Administered 2024-02-29 (×5): 5 mg via INTRAVENOUS

## 2024-02-29 SURGICAL SUPPLY — 35 items
BINDER BREAST LRG (GAUZE/BANDAGES/DRESSINGS) IMPLANT
BINDER BREAST MEDIUM (GAUZE/BANDAGES/DRESSINGS) IMPLANT
BINDER BREAST XXLRG (GAUZE/BANDAGES/DRESSINGS) IMPLANT
BLADE PHOTON ILLUMINATED (MISCELLANEOUS) ×1 IMPLANT
BLADE SURG 15 STRL LF DISP TIS (BLADE) ×1 IMPLANT
CHLORAPREP W/TINT 26 (MISCELLANEOUS) IMPLANT
CLIP APPLIE 9.375 SM OPEN (CLIP) IMPLANT
DERMABOND ADVANCED .7 DNX12 (GAUZE/BANDAGES/DRESSINGS) ×1 IMPLANT
DEVICE DUBIN SPECIMEN MAMMOGRA (MISCELLANEOUS) ×1 IMPLANT
DRAPE LAPAROTOMY 100X77 ABD (DRAPES) ×1 IMPLANT
DRSG GAUZE FLUFF 36X18 (GAUZE/BANDAGES/DRESSINGS) ×1 IMPLANT
ELECTRODE REM PT RTRN 9FT ADLT (ELECTROSURGICAL) ×1 IMPLANT
GAUZE 4X4 16PLY ~~LOC~~+RFID DBL (SPONGE) IMPLANT
GLOVE SURG SYN 7.0 PF PI (GLOVE) ×1 IMPLANT
GLOVE SURG SYN 7.5 PF PI (GLOVE) ×1 IMPLANT
GOWN STRL REUS W/ TWL LRG LVL3 (GOWN DISPOSABLE) ×2 IMPLANT
ILLUMINATOR WAVEGUIDE N/F (MISCELLANEOUS) IMPLANT
KIT MARKER MARGIN INK (KITS) IMPLANT
KIT TURNOVER KIT A (KITS) ×1 IMPLANT
LABEL OR SOLS (LABEL) ×1 IMPLANT
MANIFOLD NEPTUNE II (INSTRUMENTS) ×1 IMPLANT
NDL HYPO 22X1.5 SAFETY MO (MISCELLANEOUS) ×1 IMPLANT
NEEDLE HYPO 22X1.5 SAFETY MO (MISCELLANEOUS) ×1 IMPLANT
PACK BASIN MINOR ARMC (MISCELLANEOUS) ×1 IMPLANT
SHEATH BREAST BIOPSY SKIN MKR (SHEATH) ×1 IMPLANT
SUT SILK 3 0 SH 30 (SUTURE) IMPLANT
SUT VIC AB 3-0 SH 27X BRD (SUTURE) ×1 IMPLANT
SUTURE EHLN 3-0 FS-10 30 BLK (SUTURE) IMPLANT
SUTURE MNCRL 4-0 27XMF (SUTURE) ×1 IMPLANT
SYR 10ML LL (SYRINGE) ×1 IMPLANT
TAPE TRANSPORE STRL 2 31045 (GAUZE/BANDAGES/DRESSINGS) IMPLANT
TRAP FLUID SMOKE EVACUATOR (MISCELLANEOUS) ×1 IMPLANT
TRAP NEPTUNE SPECIMEN COLLECT (MISCELLANEOUS) ×1 IMPLANT
WATER STERILE IRR 1000ML POUR (IV SOLUTION) ×1 IMPLANT
WATER STERILE IRR 500ML POUR (IV SOLUTION) ×1 IMPLANT

## 2024-02-29 NOTE — Anesthesia Preprocedure Evaluation (Addendum)
 Anesthesia Evaluation  Patient identified by MRN, date of birth, ID band Patient awake    Reviewed: Allergy & Precautions, H&P , NPO status , Patient's Chart, lab work & pertinent test results, reviewed documented beta blocker date and time   History of Anesthesia Complications (+) PONV and history of anesthetic complications  Airway Mallampati: I  TM Distance: >3 FB Neck ROM: full    Dental  (+) Dental Advidsory Given, Caps, Teeth Intact, Missing   Pulmonary neg shortness of breath, asthma , sleep apnea (being tested) , neg COPD, neg recent URI   Pulmonary exam normal breath sounds clear to auscultation       Cardiovascular Exercise Tolerance: Good hypertension, (-) angina (-) Past MI and (-) Cardiac Stents Normal cardiovascular exam(-) dysrhythmias (-) Valvular Problems/Murmurs Rhythm:regular Rate:Normal     Neuro/Psych  PSYCHIATRIC DISORDERS Anxiety Depression    negative neurological ROS     GI/Hepatic negative GI ROS, Neg liver ROS,,,  Endo/Other  neg diabetes  Class 3 obesity  Renal/GU negative Renal ROS  negative genitourinary   Musculoskeletal   Abdominal   Peds  Hematology negative hematology ROS (+)   Anesthesia Other Findings Past Medical History: No date: Allergy No date: Anxiety No date: Arthritis No date: Asthma No date: COVID-19 No date: Depression No date: Dyspnea 2025: Gallbladder sludge No date: Hypertension No date: Obesity No date: PONV (postoperative nausea and vomiting) No date: Pre-diabetes   Reproductive/Obstetrics negative OB ROS                              Anesthesia Physical Anesthesia Plan  ASA: 3  Anesthesia Plan: General   Post-op Pain Management:    Induction: Intravenous  PONV Risk Score and Plan: 4 or greater and Ondansetron , Dexamethasone , Midazolam , Treatment may vary due to age or medical condition and Scopolamine  patch -  Pre-op  Airway Management Planned: Oral ETT  Additional Equipment:   Intra-op Plan:   Post-operative Plan: Extubation in OR  Informed Consent: I have reviewed the patients History and Physical, chart, labs and discussed the procedure including the risks, benefits and alternatives for the proposed anesthesia with the patient or authorized representative who has indicated his/her understanding and acceptance.     Dental Advisory Given  Plan Discussed with: Anesthesiologist, CRNA and Surgeon  Anesthesia Plan Comments: (Patient consented for risks of anesthesia including but not limited to:  - adverse reactions to medications - damage to eyes, teeth, lips or other oral mucosa - nerve damage due to positioning  - sore throat or hoarseness - Damage to heart, brain, nerves, lungs, other parts of body or loss of life  Patient voiced understanding and assent.)         Anesthesia Quick Evaluation

## 2024-02-29 NOTE — Anesthesia Postprocedure Evaluation (Signed)
 Anesthesia Post Note  Patient: Amy Watson  Procedure(s) Performed: BREAST LUMPECTOMY WITH RADIO FREQUENCY LOCALIZER (Left: Breast)  Patient location during evaluation: PACU Anesthesia Type: General Level of consciousness: awake and alert Pain management: pain level controlled Vital Signs Assessment: post-procedure vital signs reviewed and stable Respiratory status: spontaneous breathing, nonlabored ventilation, respiratory function stable and patient connected to nasal cannula oxygen Cardiovascular status: blood pressure returned to baseline and stable Postop Assessment: no apparent nausea or vomiting Anesthetic complications: no   No notable events documented.   Last Vitals:  Vitals:   02/29/24 1210 02/29/24 1215  BP: 119/62 (!) 121/53  Pulse: 97 (!) 103  Resp: (!) 21 20  Temp: 36.6 C   SpO2: 98% 94%    Last Pain:  Vitals:   02/29/24 1215  TempSrc:   PainSc: 0-No pain                 Lendia LITTIE Mae

## 2024-02-29 NOTE — Op Note (Signed)
  Procedure Date:  02/29/2024  Pre-operative Diagnosis:  Left breast DCIS  Post-operative Diagnosis: Left breast DCIS  Procedure:  Left breast SAVI tag-localized lumpectomy  Surgeon:  Aloysius Sheree Plant, MD  Anesthesia:  General endotracheal  Estimated Blood Loss:  10 ml  Specimens:   Left breast DCIS Left breast new medial margin  Complications:  None  Indications for Procedure:  This is a 53 y.o. female who presents with new diagnosis of DCIS.  The risks of bleeding, infection, injury to surrounding structures, hematoma, seroma, open wound, cosmetic deformity, and the need for further surgery were all discussed with the patient and was willing to proceed.  Prior to this procedure, the patient had undergone SAVI tag localization.  The patient's biopsy clip had displaced medially about 2.5 cm, but the tag was placed within the suspected area of calcifications.  Description of Procedure: The patient was correctly identified in the preoperative area and brought into the operating room.  The patient was placed supine with VTE prophylaxis in place.  Appropriate time-outs were performed.  Anesthesia was induced and the patient was intubated.  Appropriate antibiotics were infused.  The left chest was prepped and draped in usual sterile fashion.  The SAVI tag localization site was determined using the SAVI scout probe.  An incision was made overlying the tag and clip.  SAVI scout probe was used to guide our dissection using electrocautery, and a partial mastectomy was performed with adequate margins.  MarginMarker was used to ink each of the sides of the specimen.  The specimen was then imaged to confirm that the area of concern and SAVI tag were included in the excision.  The clip, being medially displaced, was just out of our specimen field.  Thus, I obtained a new medial margin.  I could palpate medially within the breast cavity an area of scarring that likely corresponded to the clip location.   This was excised and painted as well and imaged.  This confirmed that the biopsy clip was within the 2nd specimen.  Both were then sent to pathology.  The cavity was irrigated and hemostasis was assured with electrocautery.  Local anesthetic was infiltrated into the skin and subcutaneous tissue of the cavity.  The wound was then closed in multiple layers with 3-0 Vicryl and 4-0 Monocryl and sealed with DermaBond.  The patient was emerged from anesthesia and extubated and brought to the recovery room for further management.  The patient tolerated the procedure well and all counts were correct at the end of the case.   Aloysius Sheree Plant, MD

## 2024-02-29 NOTE — Discharge Instructions (Signed)
 Discharge Instructions: 1.  Patient may shower, but do not scrub wounds heavily and dab dry only. 2.  Do not submerge wounds in pool/tub until fully healed. 3.  Do not apply ointments or hydrogen peroxide to the wounds. 4.  May apply ice packs to the wounds for comfort. 5.  Please wear breast binder at all times for the next two weeks.  May remove for showers.  May apply fluffed gauze over the incision to help with padding/comfort.  If unable to tolerate the breast binder, will at least need to wear a snugger sports bra. 6.  Do not drive while taking narcotics for pain control.  Prior to driving, make sure you are able to rotate right and left to look at blindspots without significant pain or discomfort. 7.  Avoid strenuous activity with the left arm for two weeks.

## 2024-02-29 NOTE — Anesthesia Procedure Notes (Signed)
 Procedure Name: LMA Insertion Date/Time: 02/29/2024 10:33 AM  Performed by: Jackye Spanner, CRNAPre-anesthesia Checklist: Patient identified, Patient being monitored, Timeout performed, Emergency Drugs available and Suction available Patient Re-evaluated:Patient Re-evaluated prior to induction Oxygen Delivery Method: Circle system utilized Preoxygenation: Pre-oxygenation with 100% oxygen Induction Type: IV induction Ventilation: Mask ventilation without difficulty LMA: LMA inserted LMA Size: 5.0 Tube type: Oral Number of attempts: 1 Placement Confirmation: positive ETCO2 and breath sounds checked- equal and bilateral Tube secured with: Tape Dental Injury: Teeth and Oropharynx as per pre-operative assessment  Comments: Smooth atraumatic LMA placement, no complications noted.

## 2024-02-29 NOTE — Interval H&P Note (Signed)
 History and Physical Interval Note:  02/29/2024 10:12 AM  Amy Watson  has presented today for surgery, with the diagnosis of left breast DCIS.  The various methods of treatment have been discussed with the patient and family. After consideration of risks, benefits and other options for treatment, the patient has consented to  Procedure(s): BREAST LUMPECTOMY WITH RADIO FREQUENCY LOCALIZER (Left) as a surgical intervention.  The patient's history has been reviewed, patient examined, no change in status, stable for surgery.  I have reviewed the patient's chart and labs.  Questions were answered to the patient's satisfaction.     Garth Diffley

## 2024-02-29 NOTE — Transfer of Care (Signed)
 Immediate Anesthesia Transfer of Care Note  Patient: Amy Watson  Procedure(s) Performed: BREAST LUMPECTOMY WITH RADIO FREQUENCY LOCALIZER (Left: Breast)  Patient Location: PACU  Anesthesia Type:General  Level of Consciousness: drowsy and patient cooperative  Airway & Oxygen Therapy: Patient Spontanous Breathing and Patient connected to face mask oxygen  Post-op Assessment: Report given to RN and Post -op Vital signs reviewed and stable  Post vital signs: Reviewed and stable  Last Vitals:  Vitals Value Taken Time  BP 119/62 02/29/24 12:07  Temp 97.82F   Pulse 92 02/29/24 12:10  Resp 19 02/29/24 12:10  SpO2 99 % 02/29/24 12:10  Vitals shown include unfiled device data.  Last Pain:  Vitals:   02/29/24 0911  TempSrc: Temporal  PainSc: 0-No pain         Complications: No notable events documented.

## 2024-03-01 ENCOUNTER — Encounter: Payer: Self-pay | Admitting: Surgery

## 2024-03-04 DIAGNOSIS — R002 Palpitations: Secondary | ICD-10-CM | POA: Diagnosis not present

## 2024-03-05 LAB — SURGICAL PATHOLOGY

## 2024-03-06 ENCOUNTER — Ambulatory Visit: Payer: Self-pay | Admitting: Surgery

## 2024-03-06 NOTE — Progress Notes (Signed)
 noted

## 2024-03-06 NOTE — Progress Notes (Signed)
 03/06/24   Called patient to inform her of the pathology results.  This showed DCIS with negative margins.  Also showed LCIS classic type, but margins again are negative.  No invasive breast cancer.  Patient is doing well and she has follow up with me on 03/15/24.  Aloysius Plant, MD

## 2024-03-07 ENCOUNTER — Encounter: Payer: Self-pay | Admitting: Surgery

## 2024-03-11 DIAGNOSIS — I10 Essential (primary) hypertension: Secondary | ICD-10-CM | POA: Diagnosis not present

## 2024-03-11 DIAGNOSIS — Z5181 Encounter for therapeutic drug level monitoring: Secondary | ICD-10-CM | POA: Diagnosis not present

## 2024-03-11 LAB — BASIC METABOLIC PANEL WITH GFR
BUN/Creatinine Ratio: 26 — ABNORMAL HIGH (ref 9–23)
BUN: 13 mg/dL (ref 6–24)
CO2: 17 mmol/L — ABNORMAL LOW (ref 20–29)
Calcium: 8.7 mg/dL (ref 8.7–10.2)
Chloride: 107 mmol/L — ABNORMAL HIGH (ref 96–106)
Creatinine, Ser: 0.5 mg/dL — ABNORMAL LOW (ref 0.57–1.00)
Glucose: 124 mg/dL — ABNORMAL HIGH (ref 70–99)
Potassium: 3.9 mmol/L (ref 3.5–5.2)
Sodium: 142 mmol/L (ref 134–144)
eGFR: 111 mL/min/1.73 (ref >59)

## 2024-03-15 ENCOUNTER — Encounter: Payer: Self-pay | Admitting: Surgery

## 2024-03-15 ENCOUNTER — Ambulatory Visit: Admitting: Surgery

## 2024-03-15 VITALS — BP 142/83 | HR 85 | Temp 98.3°F | Ht 66.0 in | Wt 254.4 lb

## 2024-03-15 DIAGNOSIS — Z09 Encounter for follow-up examination after completed treatment for conditions other than malignant neoplasm: Secondary | ICD-10-CM

## 2024-03-15 DIAGNOSIS — D0512 Intraductal carcinoma in situ of left breast: Secondary | ICD-10-CM

## 2024-03-15 NOTE — Patient Instructions (Signed)
Lumpectomy, Care After The following information offers guidance on how to care for yourself after your procedure. Your health care provider may also give you more specific instructions. If you have problems or questions, contact your health care provider. What can I expect after the procedure? After the procedure, it is common to have: Some pain or redness at the incision site. Breast swelling. Breast tenderness. Stiffness in your arm or shoulder. A change in the shape and feel of your breast. Scar tissue that feels hard to the touch in the area where the lump was removed. Follow these instructions at home: Medicines Take over-the-counter and prescription medicines only as told by your health care provider. If you were prescribed an antibiotic, take it as told by your health care provider. Do not stop taking the antibiotic even if you start to feel better. Ask your health care provider if the medicine prescribed to you: Requires you to avoid driving or using machinery. Can cause constipation. You may need to take these actions to prevent or treat constipation: Drink enough fluid to keep your urine pale yellow. Take over-the-counter or prescription medicines. Eat foods that are high in fiber, such as beans, whole grains, and fresh fruits and vegetables. Limit foods that are high in fat and processed sugars, such as fried or sweet foods. Incision care     Follow instructions from your health care provider about how to take care of your incision. Make sure you: Wash your hands with soap and water for at least 20 seconds before and after you change your bandage (dressing). If soap and water are not available, use hand sanitizer. Change your dressing as told by your health care provider. Leave stitches (sutures), skin glue, or adhesive strips in place. These skin closures may need to stay in place for 2 weeks or longer. If adhesive strip edges start to loosen and curl up, you may trim the  loose edges. Do not remove adhesive strips completely unless your health care provider tells you to do that. Check your incision area every day for signs of infection. Check for: More redness, swelling, or pain. Fluid or blood. Warmth. Pus or a bad smell. Keep your dressing clean and dry. If you were sent home with a surgical drain in place, follow instructions from your health care provider about emptying it. Bathing Do not take baths, swim, or use a hot tub until your health care provider approves. Ask your health care provider if you may take showers. You may only be allowed to take sponge baths. Activity Rest as told by your health care provider. Do not sit for a long time without moving. Get up to take short walks every 1-2 hours. This will improve blood flow and breathing. Ask for help if you feel weak or unsteady. Be careful to avoid any activities that could cause an injury to your arm on the side of your surgery. Do not lift anything that is heavier than 10 lb (4.5 kg), or the limit that you are told, until your health care provider says that it is safe. Avoid lifting with the arm that is on the side of your surgery. Do not carry heavy objects on your shoulder on the side of your surgery. Do exercises to keep your shoulder and arm from getting stiff and swollen. Talk with your health care provider about which exercises are safe for you. Return to your normal activities as told by your health care provider. Ask your health care provider what activities  are safe for you. General instructions Wear a supportive bra as told by your health care provider. Raise (elevate) your arm above the level of your heart while you are sitting or lying down. Do not wear tight jewelry on your arm, wrist, or fingers on the side of your surgery. Wear compression stockings as told by your health care provider. These stockings help to prevent blood clots and reduce swelling in your legs. If you had any lymph  nodes removed during your procedure, be sure to tell all of your health care providers. It is important to share this information before you have certain procedures, such as blood tests or blood pressure measurements. Keep all follow-up visits. You may need to be screened for extra fluid around the lymph nodes and swelling in the breast and arm (lymphedema). Contact a health care provider if: You develop a rash. You have a fever. Your pain worsens or pain medicine is not working. You have swelling, weakness, or numbness in your arm that does not improve after a few weeks. You have new swelling in your breast. You have any of these signs of infection: More redness, swelling, or pain in your incision area. Fluid or blood coming from your incision. Warmth coming from the incision area. Pus or a bad smell coming from your incision. Get help right away if: You have very bad pain in your breast or arm. You have swelling in your legs or arms. You have redness, warmth, or pain in your leg or arm. You have chest pain. You have difficulty breathing. These symptoms may be an emergency. Get help right away. Call 911. Do not wait to see if the symptoms will go away. Do not drive yourself to the hospital. Summary After the procedure, it is common to have breast tenderness, swelling in your breast, and stiffness in your arm and shoulder. Follow instructions from your health care provider about how to take care of your incision. Do not lift anything that is heavier than 10 lb (4.5 kg), or the limit that you are told, until your health care provider says that it is safe. Avoid lifting with the arm that is on the side of your surgery. If you had any lymph nodes removed during your procedure, be sure to tell all of your health care providers. This information is not intended to replace advice given to you by your health care provider. Make sure you discuss any questions you have with your health care  provider. Document Revised: 10/10/2021 Document Reviewed: 10/10/2021 Elsevier Patient Education  2024 [employer].

## 2024-03-15 NOTE — Progress Notes (Signed)
 03/15/2024  HPI: Amy Watson is a 53 y.o. female s/p left breast tag localized lumpectomy for DCIS on 02/29/2024.  Patient presents today for follow-up.  She reports soreness over the left lateral aspect of the breast at the incision site as well as some soreness in the upper portion of the breast towards the pectoralis muscle.  She was having some tingliness near the incision but this has been improving.  Vital signs: BP (!) 142/83   Pulse 85   Temp 98.3 F (36.8 C) (Oral)   Ht 5' 6 (1.676 m)   Wt 254 lb 6.4 oz (115.4 kg)   LMP 04/06/2017   SpO2 96%   BMI 41.06 kg/m    Physical Exam: Constitutional: No acute distress Breast: Left breast lateral lumpectomy with incision healing well, clean, dry, intact.  There is palpable firmness at the incision and deeper following the lumpectomy cavity consistent with postoperative changes.  No evidence of seroma or infection at this point.  Assessment/Plan: This is a 53 y.o. female s/p left breast lumpectomy.  - Discussed with patient that the tingling is most likely related to nerves that were irritated during the surgery as they are trying to regenerate and heal.  Discussed with the patient also that the firmness is related to the scar tissue that is forming as a result of the surgery.  This will get softer with time but may always have a palpable component.  Some of the soreness related to it also will improve but there may always be a slight pulling sensation depending on how she moves.  Discussed with her that the size of the incision and the cavity were needed in order to be able to remove not just the tag and the area of DCIS but also showed the biopsy clip that had become medially displaced. - Discussed with her again biopsy results showing DCIS and incidental LCIS.  Margins were all negative.  She has an appointment with Dr. Lenn on 03/21/2024 and with Dr. Babara on 03/18/2024. - For now, the patient can follow-up with me in 5 months but return  precautions were given particularly if the soreness is getting any worse or not improving over the next few weeks.   Aloysius Sheree Plant, MD [facility]

## 2024-03-17 DIAGNOSIS — G4733 Obstructive sleep apnea (adult) (pediatric): Secondary | ICD-10-CM | POA: Diagnosis not present

## 2024-03-18 ENCOUNTER — Institutional Professional Consult (permissible substitution): Admitting: Radiation Oncology

## 2024-03-18 ENCOUNTER — Inpatient Hospital Stay: Admitting: Oncology

## 2024-03-18 ENCOUNTER — Encounter (HOSPITAL_BASED_OUTPATIENT_CLINIC_OR_DEPARTMENT_OTHER): Payer: Self-pay | Admitting: Cardiovascular Disease

## 2024-03-18 ENCOUNTER — Encounter: Payer: Self-pay | Admitting: Oncology

## 2024-03-18 VITALS — BP 135/62 | HR 85 | Temp 97.4°F | Resp 18 | Wt 256.1 lb

## 2024-03-18 DIAGNOSIS — Z6841 Body Mass Index (BMI) 40.0 and over, adult: Secondary | ICD-10-CM | POA: Insufficient documentation

## 2024-03-18 DIAGNOSIS — Z170 Estrogen receptor positive status [ER+]: Secondary | ICD-10-CM | POA: Insufficient documentation

## 2024-03-18 DIAGNOSIS — N6082 Other benign mammary dysplasias of left breast: Secondary | ICD-10-CM | POA: Insufficient documentation

## 2024-03-18 DIAGNOSIS — Z510 Encounter for antineoplastic radiation therapy: Secondary | ICD-10-CM | POA: Diagnosis not present

## 2024-03-18 DIAGNOSIS — Z881 Allergy status to other antibiotic agents status: Secondary | ICD-10-CM | POA: Diagnosis not present

## 2024-03-18 DIAGNOSIS — Z9049 Acquired absence of other specified parts of digestive tract: Secondary | ICD-10-CM | POA: Insufficient documentation

## 2024-03-18 DIAGNOSIS — N951 Menopausal and female climacteric states: Secondary | ICD-10-CM | POA: Diagnosis not present

## 2024-03-18 DIAGNOSIS — J45909 Unspecified asthma, uncomplicated: Secondary | ICD-10-CM | POA: Insufficient documentation

## 2024-03-18 DIAGNOSIS — D0512 Intraductal carcinoma in situ of left breast: Secondary | ICD-10-CM | POA: Insufficient documentation

## 2024-03-18 DIAGNOSIS — Z86018 Personal history of other benign neoplasm: Secondary | ICD-10-CM | POA: Insufficient documentation

## 2024-03-18 DIAGNOSIS — G4730 Sleep apnea, unspecified: Secondary | ICD-10-CM | POA: Diagnosis not present

## 2024-03-18 DIAGNOSIS — Z8616 Personal history of COVID-19: Secondary | ICD-10-CM | POA: Diagnosis not present

## 2024-03-18 DIAGNOSIS — F419 Anxiety disorder, unspecified: Secondary | ICD-10-CM | POA: Insufficient documentation

## 2024-03-18 DIAGNOSIS — Z808 Family history of malignant neoplasm of other organs or systems: Secondary | ICD-10-CM | POA: Insufficient documentation

## 2024-03-18 DIAGNOSIS — F32A Depression, unspecified: Secondary | ICD-10-CM | POA: Insufficient documentation

## 2024-03-18 DIAGNOSIS — I10 Essential (primary) hypertension: Secondary | ICD-10-CM | POA: Diagnosis not present

## 2024-03-18 DIAGNOSIS — Z803 Family history of malignant neoplasm of breast: Secondary | ICD-10-CM | POA: Insufficient documentation

## 2024-03-18 DIAGNOSIS — Z79899 Other long term (current) drug therapy: Secondary | ICD-10-CM | POA: Diagnosis not present

## 2024-03-18 DIAGNOSIS — Z8249 Family history of ischemic heart disease and other diseases of the circulatory system: Secondary | ICD-10-CM | POA: Diagnosis not present

## 2024-03-18 DIAGNOSIS — Z823 Family history of stroke: Secondary | ICD-10-CM | POA: Insufficient documentation

## 2024-03-18 DIAGNOSIS — Z833 Family history of diabetes mellitus: Secondary | ICD-10-CM | POA: Insufficient documentation

## 2024-03-18 DIAGNOSIS — M1990 Unspecified osteoarthritis, unspecified site: Secondary | ICD-10-CM | POA: Insufficient documentation

## 2024-03-18 DIAGNOSIS — E669 Obesity, unspecified: Secondary | ICD-10-CM | POA: Diagnosis not present

## 2024-03-18 DIAGNOSIS — Z780 Asymptomatic menopausal state: Secondary | ICD-10-CM

## 2024-03-18 NOTE — Assessment & Plan Note (Addendum)
 LMP was 2 years ago.  FSH less than 40, estradiol  19.  Patient has had hot flash -postmenopausal

## 2024-03-18 NOTE — Progress Notes (Addendum)
 Hematology/Oncology Progress note [phone] [phone]        REFERRING PROVIDER: Corwin Antu, FNP    CHIEF COMPLAINTS/PURPOSE OF CONSULT Left breast DCIS  ASSESSMENT & PLAN:   Ductal carcinoma in situ (DCIS) of left breast Left DCIS, ER positive.  Status post left breast lumpectomy.  Pathology showed DCIS with LCIS, radial scar pTis  Recommend patient to establish care with radiation oncology for radiation. After that, she will need 5 years of adjuvant endocrine therapy.   Postmenopausal LMP was 2 years ago.  FSH less than 40, estradiol  19.  Patient has had hot flash -postmenopausal   No orders of the defined types were placed in this encounter.  Follow-up to be determined. All questions were answered. The patient knows to call the clinic with any problems, questions or concerns.  [name], MD, PhD [facility] Hematology Oncology 03/18/2024    HISTORY OF PRESENTING ILLNESS:  Amy Watson 53 y.o. female presents to establish care for left breast DCIS I have reviewed her chart and materials related to her cancer extensively and collaborated history with the patient. Summary of oncologic history is as follows: Oncology History  Ductal carcinoma in situ (DCIS) of left breast  02/01/2024 Mammogram   Bilateral screening mammogram showed left breast calcifications warrant further evaluation.   02/08/2024 Imaging   LEFT breast 8 mm group of coarse heterogeneous calcification in the upper-outer quadrant is indeterminate. Recommend further assessment with stereotactic guided biopsy.   02/19/2024 Initial Diagnosis   Ductal carcinoma in situ (DCIS) of left breast  Patient underwent left breast needle core biopsy of upper outer posterior depth calcifications.  Pathology showed DCIS, intermediate to high-grade with necrosis and calcifications.  Columnar cell change and apocrine metaplasia.  Menarche at age of 30 or 11 First live birth at age of  64 OCP use: >5 years,  Menopausal status: LMP over 1 year ago History of HRT use: No History of chest radiation: No Number of previous breast biopsies:  No Family history is positive for breast cancer.   02/19/2024 Cancer Staging   Staging form: Breast, AJCC 8th Edition - Clinical stage from 02/19/2024: Stage 0 (cTis (DCIS), cN0, cM0, ER+, PR: Not Assessed, HER2: Not Assessed) - Signed by Cap Zelphia, MD on 02/19/2024 Stage prefix: Initial diagnosis Nuclear grade: G2    Genetic Testing   No pathogenic variants identified on the [employer] BRCAPlus + CancerNext+RNA panel. 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 The report date is 02/27/2024.  The [employer] Panel includes sequencing, rearrangement analysis, and RNA analysis for the following 13 genes: ATM, BARD1, BRCA1, BRCA2, CDH1, CHEK2, NF1, PALB2, PTEN, RAD51C, RAD51D, STK11 and TP53 (sequencing and deletion/duplication).  The [employer] CancerNext+RNAinsight Panel includes sequencing, rearrangement analysis, and RNA analysis for the following 40 genes: APC, ATM, BAP1, BARD1, BMPR1A, BRCA1, BRCA2, BRIP1, CDH1, CDKN2A, CHEK2, FH, FLCN, MET, MLH1, MSH2, MSH6, MUTYH, NF1, NTHL1, PALB2, PMS2, PTEN, RAD51C, RAD51D, RPS20, SMAD4, STK11, TP53, TSC1, TSC2, and VHL (sequencing and deletion/duplication); AXIN2, HOXB13, MBD4, MSH3, POLD1 and POLE (sequencing only); EPCAM and GREM1 (deletion/duplication only).   02/29/2024 Surgery   Patient status post left lumpectomy  1. Breast, lumpectomy, left :      - DUCTAL CARCINOMA IN SITU, SOLID AND CRIBRIFORM TYPES, INTERMEDIATE NUCLEAR      GRADE, WITH NECROSIS.      - DCIS GREATEST DIMENSION: 1.5 MM      - MARGINS: NEGATIVE      - CLOSEST MARGIN: LATERAL, 6 MM      -  PROGNOSTIC MARKERS (PERFORMED ON PRIOR BIOPSY): ER POSITIVE (95%)      - SAVI SCOUT PRESENT.      - OTHER FINDINGS: LOBULAR CARCINOMA IN SITU (CLASSIC TYPE), RADIAL SCAR, USUAL      DUCTAL HYPERPLASIA, COLUMNAR CELL CHANGE, FAT NECROSIS      - SEE  ONCOLOGY TABLE.       2. Breast, lumpectomy, left, new medial margin :      - BENIGN BREAST TISSUE WITH BIOPSY SITE AND CLIP PRESENT (X).      - BIOPSY SITE EXTENDS TO THE MEDIAL AND LATERAL MARGINS.      - NEGATIVE FOR ATYPIA OR MALIGNANCY.  1. CASE SUMMARY: DUCTAL CARCINOMA IN SITU OF THE BREAST Standard(s): AJCC-UICC 8 SPECIMEN Procedure: Excision Specimen Laterality: Left TUMOR Histologic Type: Ductal carcinoma in situ Size (Extent) of DCIS: Estimated size (extent) of DCIS is at least 1.5 mm Nuclear Grade: 2 Necrosis: Present, central (expansive comedo necrosis) MARGINS Margin Status: All margins negative for DCIS Distance from DCIS to closest margin: 6 mm Closest margin to DCIS: Lateral REGIONAL LYMPH NODES Regional Lymph node Status: Not applicable (no regional lymph nodes submitted or found) DISTANT METASTASIS Distant Site(s) Involved, if applicable (select all that apply): Not applicable PATHOLOGIC STAGE CLASSIFICATION (pTNM, AJCC 8th Edition) TNM Descriptors: Not applicable pTis (ductal carcinoma in situ) Regional Lymph Nodes Modifier: Not applicable pN not assigned (no nodes submitted or found) pM - Not applicable SPECIAL STUDIES Breast Biomarker Testing Performed on Previous Biopsy: [id number] - Estrogen Receptor (ER): Positive, 95%      Today patient presents to discuss pathology results  Patient reports feeling well.  No new complaints.  She has radiation oncology appointment on 03/21/2024. MEDICAL HISTORY:  Past Medical History:  Diagnosis Date   Allergy    Anxiety    Arthritis    Asthma    Cancer (HCC) 01/2024   DCIS.left breast   COVID-19    Depression    Dyspnea    Gallbladder sludge 2025   Hypertension    Obesity    PONV (postoperative nausea and vomiting)    slow to wake up after gallbladder   Pre-diabetes    Sleep apnea 2025   cpap    SURGICAL HISTORY: Past Surgical History:  Procedure Laterality Date   BREAST BIOPSY Left  02/12/2024   MM LT BREAST BX W LOC DEV 1ST LESION IMAGE BX SPEC STEREO GUIDE 02/12/2024 [facility]   BREAST BIOPSY  02/22/2024   MM LT BREAST SAVI/RF TAG 1ST LESION MAMMO GUIDE 02/22/2024 [facility]   BREAST LUMPECTOMY WITH RADIO FREQUENCY LOCALIZER Left 02/29/2024   Procedure: BREAST LUMPECTOMY WITH RADIO FREQUENCY LOCALIZER;  Surgeon: Desiderio Schanz, MD;  Location: [facility];  Service: General;  Laterality: Left;   CHOLECYSTECTOMY     COLONOSCOPY     DENTAL SURGERY     FOOT SURGERY     right, mortons neuroma rescetion   FOOT SURGERY     right plantar fascia torn   Knee arthoscopy Left 2025   NECK SURGERY     laser surgery    SOCIAL HISTORY: Social History   Socioeconomic History   Marital status: Divorced    Spouse name: Not on file   Number of children: 2   Years of education: some college   Highest education level: Some college, no degree  Occupational History   Not on file  Tobacco Use   Smoking status: Never    Passive exposure: Never   Smokeless tobacco: Never  Vaping  Use   Vaping status: Never Used  Substance and Sexual Activity   Alcohol use: Yes    Comment: rarely   Drug use: No   Sexual activity: Not Currently  Other Topics Concern   Not on file  Social History Narrative   05/06/20   From: [place]   Living: oldest daughter Jordis)   Work: Production assistant, radio and [employer]      Family: 2 children - Insurance claims handler and Lauraine      Enjoys: read, yardwork      Exercise: parttime job - steps at work   Diet: working with obesity specialist to try to reduce appetite      Safety   Seat belts: Yes    Guns: Yes  and secure   Safe in relationships: Yes    Social Drivers of Corporate investment banker Strain: Low Risk  (02/19/2024)   Overall Financial Resource Strain (CARDIA)    Difficulty of Paying Living Expenses: Not very hard  Recent Concern: Financial Resource Strain - Medium Risk (02/19/2024)   Overall Financial Resource Strain (CARDIA)    Difficulty of  Paying Living Expenses: Somewhat hard  Food Insecurity: No Food Insecurity (02/19/2024)   Hunger Vital Sign    Worried About Running Out of Food in the Last Year: Never true    Ran Out of Food in the Last Year: Never true  Transportation Needs: No Transportation Needs (02/26/2024)   PRAPARE - Administrator, Civil Service (Medical): No    Lack of Transportation (Non-Medical): No  Physical Activity: Unknown (09/13/2023)   Exercise Vital Sign    Days of Exercise per Week: 0 days    Minutes of Exercise per Session: Not on file  Recent Concern: Physical Activity - Inactive (09/13/2023)   Exercise Vital Sign    Days of Exercise per Week: 0 days    Minutes of Exercise per Session: 20 min  Stress: No Stress Concern Present (02/19/2024)   [employer] of Occupational Health - Occupational Stress Questionnaire    Feeling of Stress: Not at all  Social Connections: Unknown (09/13/2023)   Social Connection and Isolation Panel    Frequency of Communication with Friends and Family: More than three times a week    Frequency of Social Gatherings with Friends and Family: Twice a week    Attends Religious Services: Patient declined    Database administrator or Organizations: No    Attends Engineer, structural: Not on file    Marital Status: Patient declined  Intimate Partner Violence: Not At Risk (02/19/2024)   Humiliation, Afraid, Rape, and Kick questionnaire    Fear of Current or Ex-Partner: No    Emotionally Abused: No    Physically Abused: No    Sexually Abused: No    FAMILY HISTORY: Family History  Problem Relation Age of Onset   Breast cancer Mother 84   Diabetes Father    Hypertension Father    Stroke Maternal Aunt    Skin cancer Maternal Grandfather     ALLERGIES:  is allergic to prozac [fluoxetine] and azithromycin.  MEDICATIONS:  Current Outpatient Medications  Medication Sig Dispense Refill   acetaminophen  (TYLENOL ) 500 MG tablet Take 2 tablets (1,000 mg  total) by mouth every 6 (six) hours as needed for mild pain (pain score 1-3).     amLODipine  (NORVASC ) 10 MG tablet Take 1 tablet (10 mg total) by mouth daily. 90 tablet 3   B Complex Vitamins (B COMPLEX PO) Take 1  tablet by mouth daily.     busPIRone  (BUSPAR ) 10 MG tablet Take 10 mg by mouth daily.     Calcium  Carbonate+Vitamin D  (CALCIUM  600+D3) 600-200 MG-UNIT TABS Take 1 tablet by mouth in the morning and at bedtime. 180 tablet 3   cetirizine (ZYRTEC) 10 MG tablet Take 10 mg by mouth daily as needed for allergies.     Cholecalciferol  (VITAMIN D3) 1.25 MG (50000 UT) CAPS TAKE 1 TABLET BY MOUTH ONCE A WEEK. 4 capsule 0   escitalopram  (LEXAPRO ) 20 MG tablet TAKE 1 TABLET BY MOUTH EVERY DAY 90 tablet 3   hydrochlorothiazide  (HYDRODIURIL ) 25 MG tablet Take 1 tablet (25 mg total) by mouth daily. 90 tablet 3   ibuprofen  (ADVIL ) 600 MG tablet Take 1 tablet (600 mg total) by mouth every 8 (eight) hours as needed for moderate pain (pain score 4-6). 60 tablet 1   montelukast (SINGULAIR) 10 MG tablet Take 10 mg by mouth at bedtime as needed (allergies).     PROAIR HFA 108 (90 Base) MCG/ACT inhaler Inhale 1-2 puffs into the lungs every 4 (four) hours as needed for wheezing or shortness of breath.     spironolactone  (ALDACTONE ) 25 MG tablet Take 1 tablet (25 mg total) by mouth daily. 90 tablet 3   valsartan  (DIOVAN ) 320 MG tablet TAKE 1 TABLET BY MOUTH EVERY DAY 90 tablet 3   No current facility-administered medications for this visit.    Review of Systems  Constitutional:  Negative for appetite change, chills, fatigue and fever.  HENT:   Negative for hearing loss and voice change.   Eyes:  Negative for eye problems.  Respiratory:  Negative for chest tightness and cough.   Cardiovascular:  Negative for chest pain.  Gastrointestinal:  Negative for abdominal distention, abdominal pain and blood in stool.  Endocrine: Negative for hot flashes.  Genitourinary:  Negative for difficulty urinating and  frequency.   Musculoskeletal:  Negative for arthralgias.  Skin:  Negative for itching and rash.  Neurological:  Negative for extremity weakness.  Hematological:  Negative for adenopathy.  Psychiatric/Behavioral:  Negative for confusion.      PHYSICAL EXAMINATION: ECOG PERFORMANCE STATUS: 0 - Asymptomatic  Vitals:   03/18/24 1049  BP: 135/62  Pulse: 85  Resp: 18  Temp: (!) 97.4 F (36.3 C)  SpO2: 98%   Filed Weights   03/18/24 1049  Weight: 256 lb 1.6 oz (116.2 kg)    Physical Exam Constitutional:      General: She is not in acute distress.    Appearance: She is not diaphoretic.  HENT:     Head: Normocephalic and atraumatic.  Eyes:     General: No scleral icterus. Cardiovascular:     Rate and Rhythm: Normal rate and regular rhythm.     Heart sounds: No murmur heard. Pulmonary:     Effort: Pulmonary effort is normal. No respiratory distress.  Abdominal:     General: There is no distension.  Musculoskeletal:        General: Normal range of motion.     Cervical back: Normal range of motion and neck supple.  Skin:    Findings: No erythema.  Neurological:     Mental Status: She is alert and oriented to person, place, and time. Mental status is at baseline.     Motor: No abnormal muscle tone.  Psychiatric:        Mood and Affect: Mood and affect normal.      LABORATORY DATA:  I have reviewed  the data as listed    Latest Ref Rng & Units 02/20/2024    8:16 AM 09/14/2023    7:50 AM 05/09/2023    9:22 AM  CBC  WBC 4.0 - 10.5 K/uL 6.3  7.4  8.5   Hemoglobin 12.0 - 15.0 g/dL 85.9  85.8  84.3   Hematocrit 36.0 - 46.0 % 40.0  42.0  46.4   Platelets 150 - 400 K/uL 268  271.0  288       Latest Ref Rng & Units 03/11/2024    7:39 AM 02/20/2024    8:17 AM 11/14/2023   11:27 AM  CMP  Glucose 70 - 99 mg/dL 875  99  884   BUN 6 - 24 mg/dL 13  14  10    Creatinine 0.57 - 1.00 mg/dL 9.49  9.31  9.46   Sodium 134 - 144 mmol/L 142  138  139   Potassium 3.5 - 5.2 mmol/L 3.9   4.2  3.7   Chloride 96 - 106 mmol/L 107  107  102   CO2 20 - 29 mmol/L 17  23  22    Calcium  8.7 - 10.2 mg/dL 8.7  8.8  9.2   Total Protein 6.5 - 8.1 g/dL  7.2    Total Bilirubin 0.0 - 1.2 mg/dL  0.7    Alkaline Phos 38 - 126 U/L  74    AST 15 - 41 U/L  31    ALT 0 - 44 U/L  38       RADIOGRAPHIC STUDIES: I have personally reviewed the radiological images as listed and agreed with the findings in the report. MM Breast Surgical Specimen Result Date: 02/29/2024 CLINICAL DATA:  Status post [facility] localized LEFT breast lumpectomy. EXAM: SPECIMEN RADIOGRAPH OF THE LEFT BREAST COMPARISON:  Previous exam(s). FINDINGS: Status post excision of the LEFT breast. The [facility] reflector and X shaped clip are present within the specimen. IMPRESSION: Specimen radiograph of the LEFT breast. Electronically Signed   By: Norleen Croak M.D.   On: 02/29/2024 13:37   DG BREAST SURGICAL SPECIMEN NO CHARGE Result Date: 02/29/2024 This procedure is a no report and no charge.  It will auto finalize.  MM LT BREAST SAVI/RF TAG 1ST LESION MAMMO GUIDE Result Date: 02/22/2024 CLINICAL DATA:  SAVI scout localization of the LEFT breast prior to lumpectomy for biopsy-proven ductal carcinoma in-situ (DCIS). Of note, the X shaped biopsy marking clip was noted to be slightly medially displaced on the post biopsy mammogram. EXAM: NEEDLE LOCALIZATION OF THE LEFT BREAST WITH MAMMO GUIDANCE COMPARISON:  Previous exam(s). FINDINGS: Patient presents for needle localization prior to left breast lumpectomy. I met with the patient and we discussed the procedure of needle localization including benefits and alternatives. We discussed the high likelihood of a successful procedure. We discussed the risks of the procedure, including infection, bleeding, tissue injury, and further surgery. Informed, written consent was given. The usual time-out protocol was performed immediately prior to the procedure. Prior to localization, a repeat full  field CC view was obtained. This demonstrates that the X shaped biopsy marking clip is approximately 2.5 cm medially displaced from the site of biopsy. A few residual faint punctate calcifications were identified and therefore, the decision was made to localize these faint residual calcifications. Using mammographic guidance, sterile technique, 1% lidocaine  and a 10 cm SAVI SCOUT needle, the residual calcifications in the outer central left breast posterior depth was localized using a superior approach. The SAVI scout is located  at the site of residual microcalcifications, approximately 2.5 cm lateral to the X shaped biopsy marking clip. The images were marked for Dr. Aloysius Plant. IMPRESSION: Radar reflector localization of the LEFT breast. No apparent complications. Electronically Signed   By: Dirk Arrant M.D.   On: 02/22/2024 16:27

## 2024-03-18 NOTE — Assessment & Plan Note (Addendum)
 Left DCIS, ER positive.  Status post left breast lumpectomy.  Pathology showed DCIS with LCIS, radial scar pTis  Recommend patient to establish care with radiation oncology for radiation. After that, she will need 5 years of adjuvant endocrine therapy.

## 2024-03-20 ENCOUNTER — Inpatient Hospital Stay

## 2024-03-21 ENCOUNTER — Encounter: Payer: Self-pay | Admitting: Radiation Oncology

## 2024-03-21 ENCOUNTER — Ambulatory Visit
Admission: RE | Admit: 2024-03-21 | Discharge: 2024-03-21 | Disposition: A | Discharge status: Home / Self Care | Source: Ambulatory Visit | Attending: Radiation Oncology | Admitting: Radiation Oncology

## 2024-03-21 VITALS — BP 143/87 | HR 90 | Temp 97.4°F | Resp 16 | Wt 254.0 lb

## 2024-03-21 DIAGNOSIS — Z803 Family history of malignant neoplasm of breast: Secondary | ICD-10-CM | POA: Diagnosis not present

## 2024-03-21 DIAGNOSIS — Z170 Estrogen receptor positive status [ER+]: Secondary | ICD-10-CM | POA: Insufficient documentation

## 2024-03-21 DIAGNOSIS — F32A Depression, unspecified: Secondary | ICD-10-CM | POA: Diagnosis not present

## 2024-03-21 DIAGNOSIS — Z881 Allergy status to other antibiotic agents status: Secondary | ICD-10-CM | POA: Insufficient documentation

## 2024-03-21 DIAGNOSIS — G4730 Sleep apnea, unspecified: Secondary | ICD-10-CM | POA: Diagnosis not present

## 2024-03-21 DIAGNOSIS — Z8616 Personal history of COVID-19: Secondary | ICD-10-CM | POA: Diagnosis not present

## 2024-03-21 DIAGNOSIS — I10 Essential (primary) hypertension: Secondary | ICD-10-CM | POA: Insufficient documentation

## 2024-03-21 DIAGNOSIS — M1990 Unspecified osteoarthritis, unspecified site: Secondary | ICD-10-CM | POA: Insufficient documentation

## 2024-03-21 DIAGNOSIS — Z510 Encounter for antineoplastic radiation therapy: Secondary | ICD-10-CM | POA: Diagnosis not present

## 2024-03-21 DIAGNOSIS — J45909 Unspecified asthma, uncomplicated: Secondary | ICD-10-CM | POA: Diagnosis not present

## 2024-03-21 DIAGNOSIS — Z9049 Acquired absence of other specified parts of digestive tract: Secondary | ICD-10-CM | POA: Diagnosis not present

## 2024-03-21 DIAGNOSIS — Z86018 Personal history of other benign neoplasm: Secondary | ICD-10-CM | POA: Insufficient documentation

## 2024-03-21 DIAGNOSIS — D0512 Intraductal carcinoma in situ of left breast: Secondary | ICD-10-CM | POA: Insufficient documentation

## 2024-03-21 DIAGNOSIS — Z833 Family history of diabetes mellitus: Secondary | ICD-10-CM | POA: Diagnosis not present

## 2024-03-21 DIAGNOSIS — Z79899 Other long term (current) drug therapy: Secondary | ICD-10-CM | POA: Diagnosis not present

## 2024-03-21 DIAGNOSIS — Z808 Family history of malignant neoplasm of other organs or systems: Secondary | ICD-10-CM | POA: Diagnosis not present

## 2024-03-21 DIAGNOSIS — F419 Anxiety disorder, unspecified: Secondary | ICD-10-CM | POA: Insufficient documentation

## 2024-03-21 DIAGNOSIS — Z823 Family history of stroke: Secondary | ICD-10-CM | POA: Insufficient documentation

## 2024-03-21 DIAGNOSIS — Z6841 Body Mass Index (BMI) 40.0 and over, adult: Secondary | ICD-10-CM | POA: Insufficient documentation

## 2024-03-21 DIAGNOSIS — Z8249 Family history of ischemic heart disease and other diseases of the circulatory system: Secondary | ICD-10-CM | POA: Insufficient documentation

## 2024-03-21 DIAGNOSIS — E669 Obesity, unspecified: Secondary | ICD-10-CM | POA: Diagnosis not present

## 2024-03-21 NOTE — Consult Note (Signed)
 NEW PATIENT EVALUATION  Name: Amy Watson  MRN: [id number]  Date:   03/21/2024     DOB: 01/05/1971   This 53 y.o. female patient presents to the clinic for initial evaluation of stage 0 (pTis N0 M0).  ER positive  REFERRING PHYSICIAN: Babara Call, MD  CHIEF COMPLAINT: No chief complaint on file.   DIAGNOSIS: The encounter diagnosis was Ductal carcinoma in situ of left breast.   PREVIOUS INVESTIGATIONS:  Mammogram and ultrasound reviewed Clinical notes reviewed Pathology reports reviewed  HPI: Patient is a 53 year old female who presents with an abnormal mammogram of her left breast.  There was an 8 mm group of coarse heterogeneous calcifications in the upper outer quadrant with recommendation for stereotactic guided biopsy.  This was positive for intermediate grade DCIS ER positive.  She went on to have a wide local excision again showing intermediate grade ductal carcinoma in situ measuring 1.5 mm.  There was lobular carcinoma in situ present.  Margins were clear at 6 mm.  She has tolerated her surgery well and is without complaint.  She has been seen by medical oncology with recommendation for endocrine therapy after completion of radiation.  She specifically denies breast tenderness cough or bone pain.  PLANNED TREATMENT REGIMEN: Hypofractionated left whole breast radiation DIBH  PAST MEDICAL HISTORY:  has a past medical history of Allergy, Anxiety, Arthritis, Asthma, Cancer (HCC) (01/2024), COVID-19, Depression, Dyspnea, Gallbladder sludge (2025), Hypertension, Obesity, PONV (postoperative nausea and vomiting), Pre-diabetes, and Sleep apnea (2025).    PAST SURGICAL HISTORY:  Past Surgical History:  Procedure Laterality Date   BREAST BIOPSY Left 02/12/2024   MM LT BREAST BX W LOC DEV 1ST LESION IMAGE BX SPEC STEREO GUIDE 02/12/2024 [facility]   BREAST BIOPSY  02/22/2024   MM LT BREAST SAVI/RF TAG 1ST LESION MAMMO GUIDE 02/22/2024 [facility]   BREAST LUMPECTOMY WITH  RADIO FREQUENCY LOCALIZER Left 02/29/2024   Procedure: BREAST LUMPECTOMY WITH RADIO FREQUENCY LOCALIZER;  Surgeon: Desiderio Schanz, MD;  Location: [facility];  Service: General;  Laterality: Left;   CHOLECYSTECTOMY     COLONOSCOPY     DENTAL SURGERY     FOOT SURGERY     right, mortons neuroma rescetion   FOOT SURGERY     right plantar fascia torn   Knee arthoscopy Left 2025   NECK SURGERY     laser surgery    FAMILY HISTORY: family history includes Breast cancer (age of onset: 10) in her mother; Diabetes in her father; Hypertension in her father; Skin cancer in her maternal grandfather; Stroke in her maternal aunt.  SOCIAL HISTORY:  reports that she has never smoked. She has never been exposed to tobacco smoke. She has never used smokeless tobacco. She reports current alcohol use. She reports that she does not use drugs.  ALLERGIES: Prozac [fluoxetine] and Azithromycin  MEDICATIONS:  Current Outpatient Medications  Medication Sig Dispense Refill   acetaminophen  (TYLENOL ) 500 MG tablet Take 2 tablets (1,000 mg total) by mouth every 6 (six) hours as needed for mild pain (pain score 1-3).     amLODipine  (NORVASC ) 10 MG tablet Take 1 tablet (10 mg total) by mouth daily. 90 tablet 3   B Complex Vitamins (B COMPLEX PO) Take 1 tablet by mouth daily.     busPIRone  (BUSPAR ) 10 MG tablet Take 10 mg by mouth daily.     Calcium  Carbonate+Vitamin D  (CALCIUM  600+D3) 600-200 MG-UNIT TABS Take 1 tablet by mouth in the morning and at bedtime. 180 tablet 3  cetirizine (ZYRTEC) 10 MG tablet Take 10 mg by mouth daily as needed for allergies.     Cholecalciferol  (VITAMIN D3) 1.25 MG (50000 UT) CAPS TAKE 1 TABLET BY MOUTH ONCE A WEEK. 4 capsule 0   escitalopram  (LEXAPRO ) 20 MG tablet TAKE 1 TABLET BY MOUTH EVERY DAY 90 tablet 3   hydrochlorothiazide  (HYDRODIURIL ) 25 MG tablet Take 1 tablet (25 mg total) by mouth daily. 90 tablet 3   ibuprofen  (ADVIL ) 600 MG tablet Take 1 tablet (600 mg total) by mouth every 8  (eight) hours as needed for moderate pain (pain score 4-6). 60 tablet 1   montelukast (SINGULAIR) 10 MG tablet Take 10 mg by mouth at bedtime as needed (allergies).     PROAIR HFA 108 (90 Base) MCG/ACT inhaler Inhale 1-2 puffs into the lungs every 4 (four) hours as needed for wheezing or shortness of breath.     spironolactone  (ALDACTONE ) 25 MG tablet Take 1 tablet (25 mg total) by mouth daily. 90 tablet 3   valsartan  (DIOVAN ) 320 MG tablet TAKE 1 TABLET BY MOUTH EVERY DAY 90 tablet 3   No current facility-administered medications for this encounter.    ECOG PERFORMANCE STATUS:  0 - Asymptomatic  REVIEW OF SYSTEMS: Patient denies any weight loss, fatigue, weakness, fever, chills or night sweats. Patient denies any loss of vision, blurred vision. Patient denies any ringing  of the ears or hearing loss. No irregular heartbeat. Patient denies heart murmur or history of fainting. Patient denies any chest pain or pain radiating to her upper extremities. Patient denies any shortness of breath, difficulty breathing at night, cough or hemoptysis. Patient denies any swelling in the lower legs. Patient denies any nausea vomiting, vomiting of blood, or coffee ground material in the vomitus. Patient denies any stomach pain. Patient states has had normal bowel movements no significant constipation or diarrhea. Patient denies any dysuria, hematuria or significant nocturia. Patient denies any problems walking, swelling in the joints or loss of balance. Patient denies any skin changes, loss of hair or loss of weight. Patient denies any excessive worrying or anxiety or significant depression. Patient denies any problems with insomnia. Patient denies excessive thirst, polyuria, polydipsia. Patient denies any swollen glands, patient denies easy bruising or easy bleeding. Patient denies any recent infections, allergies or URI. Patient s visual fields have not changed significantly in recent time.   PHYSICAL EXAM: BP (!)  143/87   Pulse 90   Temp (!) 97.4 F (36.3 C) (Tympanic)   Resp 16   Wt 254 lb (115.2 kg)   LMP 08/15/2021   BMI 41.00 kg/m  Patient is a large breasts.  Status post wide local excision which is well-healed.  No dominant masses noted in either breast.  No axillary or supraclavicular adenopathy is appreciated.  Patient is moderately obese.  Well-developed well-nourished patient in NAD. HEENT reveals PERLA, EOMI, discs not visualized.  Oral cavity is clear. No oral mucosal lesions are identified. Neck is clear without evidence of cervical or supraclavicular adenopathy. Lungs are clear to A&P. Cardiac examination is essentially unremarkable with regular rate and rhythm without murmur rub or thrill. Abdomen is benign with no organomegaly or masses noted. Motor sensory and DTR levels are equal and symmetric in the upper and lower extremities. Cranial nerves II through XII are grossly intact. Proprioception is intact. No peripheral adenopathy or edema is identified. No motor or sensory levels are noted. Crude visual fields are within normal range.  LABORATORY DATA: Pathology reports reviewed  RADIOLOGY RESULTS: Mammograms reviewed compatible with above-stated findings   IMPRESSION: ER positive ductal carcinoma with LCIS of the left breast status post wide local excision in 53 year old female  PLAN: Based on the DCIS component as well as LCIS I like to treat whole breast.  Her breasts are large and will have more side effects such as skin reaction.  I will plan a 3-week course of hypofractionated radiation therapy.  Will try to incorporate deep inspiration breath-hold technique to spare cardiac exposure.  Would also boost her scar another 1000 cGy using photon beam.  Risks and benefits of treatment occluding skin reaction fatigue alteration blood counts possible inclusion of superficial lung all were reviewed in detail with the patient.  I personally set up and ordered CT simulation for next week.   Patient will benefit from endocrine therapy after completion of radiation.  Patient comprehends recommendations well.  I would like to take this opportunity to thank you for allowing me to participate in the care of your patient.SABRA Marcey Penton, MD

## 2024-03-27 ENCOUNTER — Encounter: Payer: Self-pay | Admitting: *Deleted

## 2024-03-27 ENCOUNTER — Ambulatory Visit
Admission: RE | Admit: 2024-03-27 | Discharge: 2024-03-27 | Disposition: A | Discharge status: Home / Self Care | Source: Ambulatory Visit | Attending: Radiation Oncology | Admitting: Radiation Oncology

## 2024-03-27 DIAGNOSIS — Z823 Family history of stroke: Secondary | ICD-10-CM | POA: Diagnosis not present

## 2024-03-27 DIAGNOSIS — Z86018 Personal history of other benign neoplasm: Secondary | ICD-10-CM | POA: Diagnosis not present

## 2024-03-27 DIAGNOSIS — M1990 Unspecified osteoarthritis, unspecified site: Secondary | ICD-10-CM | POA: Diagnosis not present

## 2024-03-27 DIAGNOSIS — G4730 Sleep apnea, unspecified: Secondary | ICD-10-CM | POA: Diagnosis not present

## 2024-03-27 DIAGNOSIS — Z79899 Other long term (current) drug therapy: Secondary | ICD-10-CM | POA: Diagnosis not present

## 2024-03-27 DIAGNOSIS — Z6841 Body Mass Index (BMI) 40.0 and over, adult: Secondary | ICD-10-CM | POA: Diagnosis not present

## 2024-03-27 DIAGNOSIS — Z510 Encounter for antineoplastic radiation therapy: Secondary | ICD-10-CM | POA: Diagnosis not present

## 2024-03-27 DIAGNOSIS — Z9049 Acquired absence of other specified parts of digestive tract: Secondary | ICD-10-CM | POA: Diagnosis not present

## 2024-03-27 DIAGNOSIS — Z170 Estrogen receptor positive status [ER+]: Secondary | ICD-10-CM | POA: Diagnosis not present

## 2024-03-27 DIAGNOSIS — Z8616 Personal history of COVID-19: Secondary | ICD-10-CM | POA: Diagnosis not present

## 2024-03-27 DIAGNOSIS — Z881 Allergy status to other antibiotic agents status: Secondary | ICD-10-CM | POA: Diagnosis not present

## 2024-03-27 DIAGNOSIS — Z803 Family history of malignant neoplasm of breast: Secondary | ICD-10-CM | POA: Diagnosis not present

## 2024-03-27 DIAGNOSIS — J45909 Unspecified asthma, uncomplicated: Secondary | ICD-10-CM | POA: Diagnosis not present

## 2024-03-27 DIAGNOSIS — Z8249 Family history of ischemic heart disease and other diseases of the circulatory system: Secondary | ICD-10-CM | POA: Diagnosis not present

## 2024-03-27 DIAGNOSIS — E669 Obesity, unspecified: Secondary | ICD-10-CM | POA: Diagnosis not present

## 2024-03-27 DIAGNOSIS — Z808 Family history of malignant neoplasm of other organs or systems: Secondary | ICD-10-CM | POA: Diagnosis not present

## 2024-03-27 DIAGNOSIS — I10 Essential (primary) hypertension: Secondary | ICD-10-CM | POA: Diagnosis not present

## 2024-03-27 DIAGNOSIS — F419 Anxiety disorder, unspecified: Secondary | ICD-10-CM | POA: Diagnosis not present

## 2024-03-27 DIAGNOSIS — Z833 Family history of diabetes mellitus: Secondary | ICD-10-CM | POA: Diagnosis not present

## 2024-03-27 DIAGNOSIS — D0512 Intraductal carcinoma in situ of left breast: Secondary | ICD-10-CM | POA: Diagnosis not present

## 2024-03-27 DIAGNOSIS — F32A Depression, unspecified: Secondary | ICD-10-CM | POA: Diagnosis not present

## 2024-03-28 ENCOUNTER — Encounter (HOSPITAL_BASED_OUTPATIENT_CLINIC_OR_DEPARTMENT_OTHER): Payer: Self-pay | Admitting: Cardiovascular Disease

## 2024-03-28 DIAGNOSIS — D0512 Intraductal carcinoma in situ of left breast: Secondary | ICD-10-CM | POA: Diagnosis not present

## 2024-03-28 DIAGNOSIS — Z881 Allergy status to other antibiotic agents status: Secondary | ICD-10-CM | POA: Diagnosis not present

## 2024-03-28 DIAGNOSIS — Z86018 Personal history of other benign neoplasm: Secondary | ICD-10-CM | POA: Diagnosis not present

## 2024-03-28 DIAGNOSIS — Z8616 Personal history of COVID-19: Secondary | ICD-10-CM | POA: Diagnosis not present

## 2024-03-28 DIAGNOSIS — J45909 Unspecified asthma, uncomplicated: Secondary | ICD-10-CM | POA: Diagnosis not present

## 2024-03-28 DIAGNOSIS — Z8249 Family history of ischemic heart disease and other diseases of the circulatory system: Secondary | ICD-10-CM | POA: Diagnosis not present

## 2024-03-28 DIAGNOSIS — Z510 Encounter for antineoplastic radiation therapy: Secondary | ICD-10-CM | POA: Diagnosis not present

## 2024-03-28 DIAGNOSIS — Z79899 Other long term (current) drug therapy: Secondary | ICD-10-CM | POA: Diagnosis not present

## 2024-03-28 DIAGNOSIS — Z803 Family history of malignant neoplasm of breast: Secondary | ICD-10-CM | POA: Diagnosis not present

## 2024-03-28 DIAGNOSIS — Z823 Family history of stroke: Secondary | ICD-10-CM | POA: Diagnosis not present

## 2024-03-28 DIAGNOSIS — Z808 Family history of malignant neoplasm of other organs or systems: Secondary | ICD-10-CM | POA: Diagnosis not present

## 2024-03-28 DIAGNOSIS — G4730 Sleep apnea, unspecified: Secondary | ICD-10-CM | POA: Diagnosis not present

## 2024-03-28 DIAGNOSIS — Z6841 Body Mass Index (BMI) 40.0 and over, adult: Secondary | ICD-10-CM | POA: Diagnosis not present

## 2024-03-28 DIAGNOSIS — M1990 Unspecified osteoarthritis, unspecified site: Secondary | ICD-10-CM | POA: Diagnosis not present

## 2024-03-28 DIAGNOSIS — E669 Obesity, unspecified: Secondary | ICD-10-CM | POA: Diagnosis not present

## 2024-03-28 DIAGNOSIS — Z170 Estrogen receptor positive status [ER+]: Secondary | ICD-10-CM | POA: Diagnosis not present

## 2024-03-28 DIAGNOSIS — I10 Essential (primary) hypertension: Secondary | ICD-10-CM | POA: Diagnosis not present

## 2024-03-28 DIAGNOSIS — Z9049 Acquired absence of other specified parts of digestive tract: Secondary | ICD-10-CM | POA: Diagnosis not present

## 2024-03-28 DIAGNOSIS — F32A Depression, unspecified: Secondary | ICD-10-CM | POA: Diagnosis not present

## 2024-03-28 DIAGNOSIS — Z833 Family history of diabetes mellitus: Secondary | ICD-10-CM | POA: Diagnosis not present

## 2024-03-28 DIAGNOSIS — F419 Anxiety disorder, unspecified: Secondary | ICD-10-CM | POA: Diagnosis not present

## 2024-03-29 ENCOUNTER — Other Ambulatory Visit: Payer: Self-pay | Admitting: *Deleted

## 2024-03-29 DIAGNOSIS — D0512 Intraductal carcinoma in situ of left breast: Secondary | ICD-10-CM

## 2024-04-03 ENCOUNTER — Ambulatory Visit
Admission: RE | Admit: 2024-04-03 | Discharge: 2024-04-03 | Disposition: A | Discharge status: Home / Self Care | Source: Ambulatory Visit | Attending: Radiation Oncology | Admitting: Radiation Oncology

## 2024-04-03 DIAGNOSIS — Z803 Family history of malignant neoplasm of breast: Secondary | ICD-10-CM | POA: Diagnosis not present

## 2024-04-03 DIAGNOSIS — D0512 Intraductal carcinoma in situ of left breast: Secondary | ICD-10-CM | POA: Diagnosis not present

## 2024-04-03 DIAGNOSIS — Z881 Allergy status to other antibiotic agents status: Secondary | ICD-10-CM | POA: Diagnosis not present

## 2024-04-03 DIAGNOSIS — Z8616 Personal history of COVID-19: Secondary | ICD-10-CM | POA: Diagnosis not present

## 2024-04-03 DIAGNOSIS — Z6841 Body Mass Index (BMI) 40.0 and over, adult: Secondary | ICD-10-CM | POA: Diagnosis not present

## 2024-04-03 DIAGNOSIS — Z8249 Family history of ischemic heart disease and other diseases of the circulatory system: Secondary | ICD-10-CM | POA: Diagnosis not present

## 2024-04-03 DIAGNOSIS — Z9049 Acquired absence of other specified parts of digestive tract: Secondary | ICD-10-CM | POA: Diagnosis not present

## 2024-04-03 DIAGNOSIS — G4730 Sleep apnea, unspecified: Secondary | ICD-10-CM | POA: Diagnosis not present

## 2024-04-03 DIAGNOSIS — Z823 Family history of stroke: Secondary | ICD-10-CM | POA: Diagnosis not present

## 2024-04-03 DIAGNOSIS — E669 Obesity, unspecified: Secondary | ICD-10-CM | POA: Diagnosis not present

## 2024-04-03 DIAGNOSIS — J45909 Unspecified asthma, uncomplicated: Secondary | ICD-10-CM | POA: Diagnosis not present

## 2024-04-03 DIAGNOSIS — Z170 Estrogen receptor positive status [ER+]: Secondary | ICD-10-CM | POA: Diagnosis not present

## 2024-04-03 DIAGNOSIS — F32A Depression, unspecified: Secondary | ICD-10-CM | POA: Diagnosis not present

## 2024-04-03 DIAGNOSIS — Z510 Encounter for antineoplastic radiation therapy: Secondary | ICD-10-CM | POA: Diagnosis not present

## 2024-04-03 DIAGNOSIS — Z79899 Other long term (current) drug therapy: Secondary | ICD-10-CM | POA: Diagnosis not present

## 2024-04-03 DIAGNOSIS — Z833 Family history of diabetes mellitus: Secondary | ICD-10-CM | POA: Diagnosis not present

## 2024-04-03 DIAGNOSIS — Z808 Family history of malignant neoplasm of other organs or systems: Secondary | ICD-10-CM | POA: Diagnosis not present

## 2024-04-03 DIAGNOSIS — F419 Anxiety disorder, unspecified: Secondary | ICD-10-CM | POA: Diagnosis not present

## 2024-04-03 DIAGNOSIS — I10 Essential (primary) hypertension: Secondary | ICD-10-CM | POA: Diagnosis not present

## 2024-04-03 DIAGNOSIS — Z86018 Personal history of other benign neoplasm: Secondary | ICD-10-CM | POA: Diagnosis not present

## 2024-04-03 DIAGNOSIS — M1990 Unspecified osteoarthritis, unspecified site: Secondary | ICD-10-CM | POA: Diagnosis not present

## 2024-04-04 ENCOUNTER — Encounter: Payer: Self-pay | Admitting: *Deleted

## 2024-04-04 ENCOUNTER — Other Ambulatory Visit: Payer: Self-pay

## 2024-04-04 ENCOUNTER — Ambulatory Visit: Attending: Cardiovascular Disease

## 2024-04-04 ENCOUNTER — Ambulatory Visit
Admission: RE | Admit: 2024-04-04 | Discharge: 2024-04-04 | Disposition: A | Discharge status: Home / Self Care | Source: Ambulatory Visit | Attending: Radiation Oncology | Admitting: Radiation Oncology

## 2024-04-04 DIAGNOSIS — Z803 Family history of malignant neoplasm of breast: Secondary | ICD-10-CM | POA: Diagnosis not present

## 2024-04-04 DIAGNOSIS — Z6841 Body Mass Index (BMI) 40.0 and over, adult: Secondary | ICD-10-CM | POA: Diagnosis not present

## 2024-04-04 DIAGNOSIS — Z86018 Personal history of other benign neoplasm: Secondary | ICD-10-CM | POA: Diagnosis not present

## 2024-04-04 DIAGNOSIS — J45909 Unspecified asthma, uncomplicated: Secondary | ICD-10-CM | POA: Diagnosis not present

## 2024-04-04 DIAGNOSIS — G4730 Sleep apnea, unspecified: Secondary | ICD-10-CM | POA: Diagnosis not present

## 2024-04-04 DIAGNOSIS — Z8616 Personal history of COVID-19: Secondary | ICD-10-CM | POA: Diagnosis not present

## 2024-04-04 DIAGNOSIS — F32A Depression, unspecified: Secondary | ICD-10-CM | POA: Diagnosis not present

## 2024-04-04 DIAGNOSIS — E669 Obesity, unspecified: Secondary | ICD-10-CM | POA: Diagnosis not present

## 2024-04-04 DIAGNOSIS — Z823 Family history of stroke: Secondary | ICD-10-CM | POA: Diagnosis not present

## 2024-04-04 DIAGNOSIS — Z808 Family history of malignant neoplasm of other organs or systems: Secondary | ICD-10-CM | POA: Diagnosis not present

## 2024-04-04 DIAGNOSIS — Z510 Encounter for antineoplastic radiation therapy: Secondary | ICD-10-CM | POA: Diagnosis not present

## 2024-04-04 DIAGNOSIS — Z881 Allergy status to other antibiotic agents status: Secondary | ICD-10-CM | POA: Diagnosis not present

## 2024-04-04 DIAGNOSIS — Z170 Estrogen receptor positive status [ER+]: Secondary | ICD-10-CM | POA: Diagnosis not present

## 2024-04-04 DIAGNOSIS — Z9049 Acquired absence of other specified parts of digestive tract: Secondary | ICD-10-CM | POA: Diagnosis not present

## 2024-04-04 DIAGNOSIS — Z833 Family history of diabetes mellitus: Secondary | ICD-10-CM | POA: Diagnosis not present

## 2024-04-04 DIAGNOSIS — Z79899 Other long term (current) drug therapy: Secondary | ICD-10-CM | POA: Diagnosis not present

## 2024-04-04 DIAGNOSIS — Z8249 Family history of ischemic heart disease and other diseases of the circulatory system: Secondary | ICD-10-CM | POA: Diagnosis not present

## 2024-04-04 DIAGNOSIS — F419 Anxiety disorder, unspecified: Secondary | ICD-10-CM | POA: Diagnosis not present

## 2024-04-04 DIAGNOSIS — D0512 Intraductal carcinoma in situ of left breast: Secondary | ICD-10-CM | POA: Diagnosis not present

## 2024-04-04 DIAGNOSIS — M1990 Unspecified osteoarthritis, unspecified site: Secondary | ICD-10-CM | POA: Diagnosis not present

## 2024-04-04 DIAGNOSIS — R002 Palpitations: Secondary | ICD-10-CM

## 2024-04-04 DIAGNOSIS — I10 Essential (primary) hypertension: Secondary | ICD-10-CM | POA: Diagnosis not present

## 2024-04-04 LAB — RAD ONC ARIA SESSION SUMMARY
Course Elapsed Days: 0
Plan Fractions Treated to Date: 1
Plan Prescribed Dose Per Fraction: 2.66 Gy
Plan Total Fractions Prescribed: 16
Plan Total Prescribed Dose: 42.56 Gy
Reference Point Dosage Given to Date: 2.66 Gy
Reference Point Session Dosage Given: 2.66 Gy
Session Number: 1

## 2024-04-05 ENCOUNTER — Other Ambulatory Visit: Payer: Self-pay

## 2024-04-05 ENCOUNTER — Ambulatory Visit
Admission: RE | Admit: 2024-04-05 | Discharge: 2024-04-05 | Disposition: A | Discharge status: Home / Self Care | Source: Ambulatory Visit | Attending: Radiation Oncology | Admitting: Radiation Oncology

## 2024-04-05 DIAGNOSIS — Z803 Family history of malignant neoplasm of breast: Secondary | ICD-10-CM | POA: Diagnosis not present

## 2024-04-05 DIAGNOSIS — Z881 Allergy status to other antibiotic agents status: Secondary | ICD-10-CM | POA: Diagnosis not present

## 2024-04-05 DIAGNOSIS — Z823 Family history of stroke: Secondary | ICD-10-CM | POA: Diagnosis not present

## 2024-04-05 DIAGNOSIS — Z9049 Acquired absence of other specified parts of digestive tract: Secondary | ICD-10-CM | POA: Diagnosis not present

## 2024-04-05 DIAGNOSIS — Z6841 Body Mass Index (BMI) 40.0 and over, adult: Secondary | ICD-10-CM | POA: Diagnosis not present

## 2024-04-05 DIAGNOSIS — Z79899 Other long term (current) drug therapy: Secondary | ICD-10-CM | POA: Diagnosis not present

## 2024-04-05 DIAGNOSIS — R002 Palpitations: Secondary | ICD-10-CM | POA: Diagnosis not present

## 2024-04-05 DIAGNOSIS — Z86018 Personal history of other benign neoplasm: Secondary | ICD-10-CM | POA: Diagnosis not present

## 2024-04-05 DIAGNOSIS — Z510 Encounter for antineoplastic radiation therapy: Secondary | ICD-10-CM | POA: Diagnosis not present

## 2024-04-05 DIAGNOSIS — F419 Anxiety disorder, unspecified: Secondary | ICD-10-CM | POA: Diagnosis not present

## 2024-04-05 DIAGNOSIS — E669 Obesity, unspecified: Secondary | ICD-10-CM | POA: Diagnosis not present

## 2024-04-05 DIAGNOSIS — F32A Depression, unspecified: Secondary | ICD-10-CM | POA: Diagnosis not present

## 2024-04-05 DIAGNOSIS — G4730 Sleep apnea, unspecified: Secondary | ICD-10-CM | POA: Diagnosis not present

## 2024-04-05 DIAGNOSIS — Z8616 Personal history of COVID-19: Secondary | ICD-10-CM | POA: Diagnosis not present

## 2024-04-05 DIAGNOSIS — I10 Essential (primary) hypertension: Secondary | ICD-10-CM | POA: Diagnosis not present

## 2024-04-05 DIAGNOSIS — Z8249 Family history of ischemic heart disease and other diseases of the circulatory system: Secondary | ICD-10-CM | POA: Diagnosis not present

## 2024-04-05 DIAGNOSIS — M1990 Unspecified osteoarthritis, unspecified site: Secondary | ICD-10-CM | POA: Diagnosis not present

## 2024-04-05 DIAGNOSIS — J45909 Unspecified asthma, uncomplicated: Secondary | ICD-10-CM | POA: Diagnosis not present

## 2024-04-05 DIAGNOSIS — Z170 Estrogen receptor positive status [ER+]: Secondary | ICD-10-CM | POA: Diagnosis not present

## 2024-04-05 DIAGNOSIS — Z833 Family history of diabetes mellitus: Secondary | ICD-10-CM | POA: Diagnosis not present

## 2024-04-05 DIAGNOSIS — Z808 Family history of malignant neoplasm of other organs or systems: Secondary | ICD-10-CM | POA: Diagnosis not present

## 2024-04-05 DIAGNOSIS — D0512 Intraductal carcinoma in situ of left breast: Secondary | ICD-10-CM | POA: Diagnosis not present

## 2024-04-05 LAB — RAD ONC ARIA SESSION SUMMARY
Course Elapsed Days: 1
Plan Fractions Treated to Date: 2
Plan Prescribed Dose Per Fraction: 2.66 Gy
Plan Total Fractions Prescribed: 16
Plan Total Prescribed Dose: 42.56 Gy
Reference Point Dosage Given to Date: 5.32 Gy
Reference Point Session Dosage Given: 2.66 Gy
Session Number: 2

## 2024-04-08 ENCOUNTER — Ambulatory Visit
Admission: RE | Admit: 2024-04-08 | Discharge: 2024-04-08 | Disposition: A | Discharge status: Home / Self Care | Source: Ambulatory Visit | Attending: Radiation Oncology | Admitting: Radiation Oncology

## 2024-04-08 ENCOUNTER — Other Ambulatory Visit: Payer: Self-pay

## 2024-04-08 DIAGNOSIS — Z808 Family history of malignant neoplasm of other organs or systems: Secondary | ICD-10-CM | POA: Diagnosis not present

## 2024-04-08 DIAGNOSIS — G4730 Sleep apnea, unspecified: Secondary | ICD-10-CM | POA: Diagnosis not present

## 2024-04-08 DIAGNOSIS — M1990 Unspecified osteoarthritis, unspecified site: Secondary | ICD-10-CM | POA: Diagnosis not present

## 2024-04-08 DIAGNOSIS — Z881 Allergy status to other antibiotic agents status: Secondary | ICD-10-CM | POA: Diagnosis not present

## 2024-04-08 DIAGNOSIS — Z833 Family history of diabetes mellitus: Secondary | ICD-10-CM | POA: Diagnosis not present

## 2024-04-08 DIAGNOSIS — Z6841 Body Mass Index (BMI) 40.0 and over, adult: Secondary | ICD-10-CM | POA: Diagnosis not present

## 2024-04-08 DIAGNOSIS — E669 Obesity, unspecified: Secondary | ICD-10-CM | POA: Diagnosis not present

## 2024-04-08 DIAGNOSIS — Z9049 Acquired absence of other specified parts of digestive tract: Secondary | ICD-10-CM | POA: Diagnosis not present

## 2024-04-08 DIAGNOSIS — J45909 Unspecified asthma, uncomplicated: Secondary | ICD-10-CM | POA: Diagnosis not present

## 2024-04-08 DIAGNOSIS — Z79899 Other long term (current) drug therapy: Secondary | ICD-10-CM | POA: Diagnosis not present

## 2024-04-08 DIAGNOSIS — F32A Depression, unspecified: Secondary | ICD-10-CM | POA: Diagnosis not present

## 2024-04-08 DIAGNOSIS — Z8616 Personal history of COVID-19: Secondary | ICD-10-CM | POA: Diagnosis not present

## 2024-04-08 DIAGNOSIS — Z823 Family history of stroke: Secondary | ICD-10-CM | POA: Diagnosis not present

## 2024-04-08 DIAGNOSIS — Z510 Encounter for antineoplastic radiation therapy: Secondary | ICD-10-CM | POA: Diagnosis not present

## 2024-04-08 DIAGNOSIS — D0512 Intraductal carcinoma in situ of left breast: Secondary | ICD-10-CM | POA: Diagnosis not present

## 2024-04-08 DIAGNOSIS — Z170 Estrogen receptor positive status [ER+]: Secondary | ICD-10-CM | POA: Diagnosis not present

## 2024-04-08 DIAGNOSIS — Z86018 Personal history of other benign neoplasm: Secondary | ICD-10-CM | POA: Diagnosis not present

## 2024-04-08 DIAGNOSIS — I10 Essential (primary) hypertension: Secondary | ICD-10-CM | POA: Diagnosis not present

## 2024-04-08 DIAGNOSIS — Z8249 Family history of ischemic heart disease and other diseases of the circulatory system: Secondary | ICD-10-CM | POA: Diagnosis not present

## 2024-04-08 DIAGNOSIS — F419 Anxiety disorder, unspecified: Secondary | ICD-10-CM | POA: Diagnosis not present

## 2024-04-08 DIAGNOSIS — Z803 Family history of malignant neoplasm of breast: Secondary | ICD-10-CM | POA: Diagnosis not present

## 2024-04-08 LAB — RAD ONC ARIA SESSION SUMMARY
Course Elapsed Days: 4
Plan Fractions Treated to Date: 3
Plan Prescribed Dose Per Fraction: 2.66 Gy
Plan Total Fractions Prescribed: 16
Plan Total Prescribed Dose: 42.56 Gy
Reference Point Dosage Given to Date: 7.98 Gy
Reference Point Session Dosage Given: 2.66 Gy
Session Number: 3

## 2024-04-09 ENCOUNTER — Other Ambulatory Visit: Payer: Self-pay

## 2024-04-09 ENCOUNTER — Ambulatory Visit
Admission: RE | Admit: 2024-04-09 | Discharge: 2024-04-09 | Disposition: A | Discharge status: Home / Self Care | Source: Ambulatory Visit | Attending: Radiation Oncology | Admitting: Radiation Oncology

## 2024-04-09 DIAGNOSIS — Z510 Encounter for antineoplastic radiation therapy: Secondary | ICD-10-CM | POA: Diagnosis not present

## 2024-04-09 DIAGNOSIS — I10 Essential (primary) hypertension: Secondary | ICD-10-CM | POA: Diagnosis not present

## 2024-04-09 DIAGNOSIS — Z170 Estrogen receptor positive status [ER+]: Secondary | ICD-10-CM | POA: Diagnosis not present

## 2024-04-09 DIAGNOSIS — E669 Obesity, unspecified: Secondary | ICD-10-CM | POA: Diagnosis not present

## 2024-04-09 DIAGNOSIS — M1990 Unspecified osteoarthritis, unspecified site: Secondary | ICD-10-CM | POA: Diagnosis not present

## 2024-04-09 DIAGNOSIS — Z833 Family history of diabetes mellitus: Secondary | ICD-10-CM | POA: Diagnosis not present

## 2024-04-09 DIAGNOSIS — G4730 Sleep apnea, unspecified: Secondary | ICD-10-CM | POA: Diagnosis not present

## 2024-04-09 DIAGNOSIS — Z8249 Family history of ischemic heart disease and other diseases of the circulatory system: Secondary | ICD-10-CM | POA: Diagnosis not present

## 2024-04-09 DIAGNOSIS — Z881 Allergy status to other antibiotic agents status: Secondary | ICD-10-CM | POA: Diagnosis not present

## 2024-04-09 DIAGNOSIS — J45909 Unspecified asthma, uncomplicated: Secondary | ICD-10-CM | POA: Diagnosis not present

## 2024-04-09 DIAGNOSIS — D0512 Intraductal carcinoma in situ of left breast: Secondary | ICD-10-CM | POA: Diagnosis not present

## 2024-04-09 DIAGNOSIS — Z803 Family history of malignant neoplasm of breast: Secondary | ICD-10-CM | POA: Diagnosis not present

## 2024-04-09 DIAGNOSIS — Z8616 Personal history of COVID-19: Secondary | ICD-10-CM | POA: Diagnosis not present

## 2024-04-09 DIAGNOSIS — Z79899 Other long term (current) drug therapy: Secondary | ICD-10-CM | POA: Diagnosis not present

## 2024-04-09 DIAGNOSIS — F419 Anxiety disorder, unspecified: Secondary | ICD-10-CM | POA: Diagnosis not present

## 2024-04-09 DIAGNOSIS — Z86018 Personal history of other benign neoplasm: Secondary | ICD-10-CM | POA: Diagnosis not present

## 2024-04-09 DIAGNOSIS — Z808 Family history of malignant neoplasm of other organs or systems: Secondary | ICD-10-CM | POA: Diagnosis not present

## 2024-04-09 DIAGNOSIS — F32A Depression, unspecified: Secondary | ICD-10-CM | POA: Diagnosis not present

## 2024-04-09 DIAGNOSIS — Z823 Family history of stroke: Secondary | ICD-10-CM | POA: Diagnosis not present

## 2024-04-09 DIAGNOSIS — Z9049 Acquired absence of other specified parts of digestive tract: Secondary | ICD-10-CM | POA: Diagnosis not present

## 2024-04-09 DIAGNOSIS — Z6841 Body Mass Index (BMI) 40.0 and over, adult: Secondary | ICD-10-CM | POA: Diagnosis not present

## 2024-04-09 LAB — RAD ONC ARIA SESSION SUMMARY
Course Elapsed Days: 5
Plan Fractions Treated to Date: 4
Plan Prescribed Dose Per Fraction: 2.66 Gy
Plan Total Fractions Prescribed: 16
Plan Total Prescribed Dose: 42.56 Gy
Reference Point Dosage Given to Date: 10.64 Gy
Reference Point Session Dosage Given: 2.66 Gy
Session Number: 4

## 2024-04-10 ENCOUNTER — Ambulatory Visit
Admission: RE | Admit: 2024-04-10 | Discharge: 2024-04-10 | Disposition: A | Discharge status: Home / Self Care | Source: Ambulatory Visit | Attending: Radiation Oncology | Admitting: Radiation Oncology

## 2024-04-10 ENCOUNTER — Inpatient Hospital Stay

## 2024-04-10 ENCOUNTER — Other Ambulatory Visit: Payer: Self-pay

## 2024-04-10 DIAGNOSIS — E669 Obesity, unspecified: Secondary | ICD-10-CM | POA: Diagnosis not present

## 2024-04-10 DIAGNOSIS — Z808 Family history of malignant neoplasm of other organs or systems: Secondary | ICD-10-CM | POA: Diagnosis not present

## 2024-04-10 DIAGNOSIS — F419 Anxiety disorder, unspecified: Secondary | ICD-10-CM | POA: Diagnosis not present

## 2024-04-10 DIAGNOSIS — Z8249 Family history of ischemic heart disease and other diseases of the circulatory system: Secondary | ICD-10-CM | POA: Diagnosis not present

## 2024-04-10 DIAGNOSIS — M1990 Unspecified osteoarthritis, unspecified site: Secondary | ICD-10-CM | POA: Diagnosis not present

## 2024-04-10 DIAGNOSIS — Z79899 Other long term (current) drug therapy: Secondary | ICD-10-CM | POA: Diagnosis not present

## 2024-04-10 DIAGNOSIS — Z823 Family history of stroke: Secondary | ICD-10-CM | POA: Diagnosis not present

## 2024-04-10 DIAGNOSIS — G4730 Sleep apnea, unspecified: Secondary | ICD-10-CM | POA: Diagnosis not present

## 2024-04-10 DIAGNOSIS — Z170 Estrogen receptor positive status [ER+]: Secondary | ICD-10-CM | POA: Diagnosis not present

## 2024-04-10 DIAGNOSIS — Z803 Family history of malignant neoplasm of breast: Secondary | ICD-10-CM | POA: Diagnosis not present

## 2024-04-10 DIAGNOSIS — Z6841 Body Mass Index (BMI) 40.0 and over, adult: Secondary | ICD-10-CM | POA: Diagnosis not present

## 2024-04-10 DIAGNOSIS — J45909 Unspecified asthma, uncomplicated: Secondary | ICD-10-CM | POA: Diagnosis not present

## 2024-04-10 DIAGNOSIS — Z9049 Acquired absence of other specified parts of digestive tract: Secondary | ICD-10-CM | POA: Diagnosis not present

## 2024-04-10 DIAGNOSIS — Z86018 Personal history of other benign neoplasm: Secondary | ICD-10-CM | POA: Diagnosis not present

## 2024-04-10 DIAGNOSIS — D0512 Intraductal carcinoma in situ of left breast: Secondary | ICD-10-CM

## 2024-04-10 DIAGNOSIS — F32A Depression, unspecified: Secondary | ICD-10-CM | POA: Diagnosis not present

## 2024-04-10 DIAGNOSIS — Z833 Family history of diabetes mellitus: Secondary | ICD-10-CM | POA: Diagnosis not present

## 2024-04-10 DIAGNOSIS — I10 Essential (primary) hypertension: Secondary | ICD-10-CM | POA: Diagnosis not present

## 2024-04-10 DIAGNOSIS — Z881 Allergy status to other antibiotic agents status: Secondary | ICD-10-CM | POA: Diagnosis not present

## 2024-04-10 DIAGNOSIS — Z8616 Personal history of COVID-19: Secondary | ICD-10-CM | POA: Diagnosis not present

## 2024-04-10 DIAGNOSIS — Z510 Encounter for antineoplastic radiation therapy: Secondary | ICD-10-CM | POA: Diagnosis not present

## 2024-04-10 LAB — RAD ONC ARIA SESSION SUMMARY
Course Elapsed Days: 6
Plan Fractions Treated to Date: 5
Plan Prescribed Dose Per Fraction: 2.66 Gy
Plan Total Fractions Prescribed: 16
Plan Total Prescribed Dose: 42.56 Gy
Reference Point Dosage Given to Date: 13.3 Gy
Reference Point Session Dosage Given: 2.66 Gy
Session Number: 5

## 2024-04-10 LAB — CBC (CANCER CENTER ONLY)
HCT: 40.3 % (ref 36.0–46.0)
Hemoglobin: 14.1 g/dL (ref 12.0–15.0)
MCH: 30.8 pg (ref 26.0–34.0)
MCHC: 35 g/dL (ref 30.0–36.0)
MCV: 88 fL (ref 80.0–100.0)
Platelet Count: 268 K/uL (ref 150–400)
RBC: 4.58 MIL/uL (ref 3.87–5.11)
RDW: 12.1 % (ref 11.5–15.5)
WBC Count: 8.9 K/uL (ref 4.0–10.5)
nRBC: 0 % (ref 0.0–0.2)

## 2024-04-11 ENCOUNTER — Ambulatory Visit
Admission: RE | Admit: 2024-04-11 | Discharge: 2024-04-11 | Disposition: A | Discharge status: Home / Self Care | Source: Ambulatory Visit | Attending: Radiation Oncology | Admitting: Radiation Oncology

## 2024-04-11 ENCOUNTER — Other Ambulatory Visit: Payer: Self-pay

## 2024-04-11 DIAGNOSIS — E669 Obesity, unspecified: Secondary | ICD-10-CM | POA: Diagnosis not present

## 2024-04-11 DIAGNOSIS — Z510 Encounter for antineoplastic radiation therapy: Secondary | ICD-10-CM | POA: Diagnosis not present

## 2024-04-11 DIAGNOSIS — Z8249 Family history of ischemic heart disease and other diseases of the circulatory system: Secondary | ICD-10-CM | POA: Diagnosis not present

## 2024-04-11 DIAGNOSIS — Z9049 Acquired absence of other specified parts of digestive tract: Secondary | ICD-10-CM | POA: Diagnosis not present

## 2024-04-11 DIAGNOSIS — M1990 Unspecified osteoarthritis, unspecified site: Secondary | ICD-10-CM | POA: Diagnosis not present

## 2024-04-11 DIAGNOSIS — F32A Depression, unspecified: Secondary | ICD-10-CM | POA: Diagnosis not present

## 2024-04-11 DIAGNOSIS — Z6841 Body Mass Index (BMI) 40.0 and over, adult: Secondary | ICD-10-CM | POA: Diagnosis not present

## 2024-04-11 DIAGNOSIS — Z803 Family history of malignant neoplasm of breast: Secondary | ICD-10-CM | POA: Diagnosis not present

## 2024-04-11 DIAGNOSIS — Z881 Allergy status to other antibiotic agents status: Secondary | ICD-10-CM | POA: Diagnosis not present

## 2024-04-11 DIAGNOSIS — Z808 Family history of malignant neoplasm of other organs or systems: Secondary | ICD-10-CM | POA: Diagnosis not present

## 2024-04-11 DIAGNOSIS — Z8616 Personal history of COVID-19: Secondary | ICD-10-CM | POA: Diagnosis not present

## 2024-04-11 DIAGNOSIS — Z833 Family history of diabetes mellitus: Secondary | ICD-10-CM | POA: Diagnosis not present

## 2024-04-11 DIAGNOSIS — Z79899 Other long term (current) drug therapy: Secondary | ICD-10-CM | POA: Diagnosis not present

## 2024-04-11 DIAGNOSIS — I10 Essential (primary) hypertension: Secondary | ICD-10-CM | POA: Diagnosis not present

## 2024-04-11 DIAGNOSIS — J45909 Unspecified asthma, uncomplicated: Secondary | ICD-10-CM | POA: Diagnosis not present

## 2024-04-11 DIAGNOSIS — G4730 Sleep apnea, unspecified: Secondary | ICD-10-CM | POA: Diagnosis not present

## 2024-04-11 DIAGNOSIS — Z170 Estrogen receptor positive status [ER+]: Secondary | ICD-10-CM | POA: Diagnosis not present

## 2024-04-11 DIAGNOSIS — Z823 Family history of stroke: Secondary | ICD-10-CM | POA: Diagnosis not present

## 2024-04-11 DIAGNOSIS — D0512 Intraductal carcinoma in situ of left breast: Secondary | ICD-10-CM | POA: Diagnosis not present

## 2024-04-11 DIAGNOSIS — Z86018 Personal history of other benign neoplasm: Secondary | ICD-10-CM | POA: Diagnosis not present

## 2024-04-11 DIAGNOSIS — F419 Anxiety disorder, unspecified: Secondary | ICD-10-CM | POA: Diagnosis not present

## 2024-04-11 LAB — RAD ONC ARIA SESSION SUMMARY
Course Elapsed Days: 7
Plan Fractions Treated to Date: 6
Plan Prescribed Dose Per Fraction: 2.66 Gy
Plan Total Fractions Prescribed: 16
Plan Total Prescribed Dose: 42.56 Gy
Reference Point Dosage Given to Date: 15.96 Gy
Reference Point Session Dosage Given: 2.66 Gy
Session Number: 6

## 2024-04-12 ENCOUNTER — Other Ambulatory Visit: Payer: Self-pay

## 2024-04-12 ENCOUNTER — Ambulatory Visit
Admission: RE | Admit: 2024-04-12 | Discharge: 2024-04-12 | Disposition: A | Discharge status: Home / Self Care | Source: Ambulatory Visit | Attending: Radiation Oncology | Admitting: Radiation Oncology

## 2024-04-12 DIAGNOSIS — Z8616 Personal history of COVID-19: Secondary | ICD-10-CM | POA: Diagnosis not present

## 2024-04-12 DIAGNOSIS — J45909 Unspecified asthma, uncomplicated: Secondary | ICD-10-CM | POA: Diagnosis not present

## 2024-04-12 DIAGNOSIS — E669 Obesity, unspecified: Secondary | ICD-10-CM | POA: Diagnosis not present

## 2024-04-12 DIAGNOSIS — Z170 Estrogen receptor positive status [ER+]: Secondary | ICD-10-CM | POA: Diagnosis not present

## 2024-04-12 DIAGNOSIS — D0512 Intraductal carcinoma in situ of left breast: Secondary | ICD-10-CM | POA: Diagnosis not present

## 2024-04-12 DIAGNOSIS — Z803 Family history of malignant neoplasm of breast: Secondary | ICD-10-CM | POA: Diagnosis not present

## 2024-04-12 DIAGNOSIS — Z79899 Other long term (current) drug therapy: Secondary | ICD-10-CM | POA: Diagnosis not present

## 2024-04-12 DIAGNOSIS — Z86018 Personal history of other benign neoplasm: Secondary | ICD-10-CM | POA: Diagnosis not present

## 2024-04-12 DIAGNOSIS — Z6841 Body Mass Index (BMI) 40.0 and over, adult: Secondary | ICD-10-CM | POA: Diagnosis not present

## 2024-04-12 DIAGNOSIS — Z510 Encounter for antineoplastic radiation therapy: Secondary | ICD-10-CM | POA: Diagnosis not present

## 2024-04-12 DIAGNOSIS — Z881 Allergy status to other antibiotic agents status: Secondary | ICD-10-CM | POA: Diagnosis not present

## 2024-04-12 DIAGNOSIS — Z9049 Acquired absence of other specified parts of digestive tract: Secondary | ICD-10-CM | POA: Diagnosis not present

## 2024-04-12 DIAGNOSIS — Z833 Family history of diabetes mellitus: Secondary | ICD-10-CM | POA: Diagnosis not present

## 2024-04-12 DIAGNOSIS — G4730 Sleep apnea, unspecified: Secondary | ICD-10-CM | POA: Diagnosis not present

## 2024-04-12 DIAGNOSIS — M1990 Unspecified osteoarthritis, unspecified site: Secondary | ICD-10-CM | POA: Diagnosis not present

## 2024-04-12 DIAGNOSIS — Z823 Family history of stroke: Secondary | ICD-10-CM | POA: Diagnosis not present

## 2024-04-12 DIAGNOSIS — F419 Anxiety disorder, unspecified: Secondary | ICD-10-CM | POA: Diagnosis not present

## 2024-04-12 DIAGNOSIS — Z8249 Family history of ischemic heart disease and other diseases of the circulatory system: Secondary | ICD-10-CM | POA: Diagnosis not present

## 2024-04-12 DIAGNOSIS — I10 Essential (primary) hypertension: Secondary | ICD-10-CM | POA: Diagnosis not present

## 2024-04-12 DIAGNOSIS — F32A Depression, unspecified: Secondary | ICD-10-CM | POA: Diagnosis not present

## 2024-04-12 DIAGNOSIS — Z808 Family history of malignant neoplasm of other organs or systems: Secondary | ICD-10-CM | POA: Diagnosis not present

## 2024-04-12 LAB — RAD ONC ARIA SESSION SUMMARY
Course Elapsed Days: 8
Plan Fractions Treated to Date: 7
Plan Prescribed Dose Per Fraction: 2.66 Gy
Plan Total Fractions Prescribed: 16
Plan Total Prescribed Dose: 42.56 Gy
Reference Point Dosage Given to Date: 18.62 Gy
Reference Point Session Dosage Given: 2.66 Gy
Session Number: 7

## 2024-04-16 ENCOUNTER — Other Ambulatory Visit: Payer: Self-pay

## 2024-04-16 ENCOUNTER — Ambulatory Visit
Admission: RE | Admit: 2024-04-16 | Discharge: 2024-04-16 | Disposition: A | Discharge status: Home / Self Care | Source: Ambulatory Visit | Attending: Radiation Oncology | Admitting: Radiation Oncology

## 2024-04-16 DIAGNOSIS — Z79899 Other long term (current) drug therapy: Secondary | ICD-10-CM | POA: Diagnosis not present

## 2024-04-16 DIAGNOSIS — J45909 Unspecified asthma, uncomplicated: Secondary | ICD-10-CM | POA: Insufficient documentation

## 2024-04-16 DIAGNOSIS — G4730 Sleep apnea, unspecified: Secondary | ICD-10-CM | POA: Diagnosis not present

## 2024-04-16 DIAGNOSIS — Z823 Family history of stroke: Secondary | ICD-10-CM | POA: Insufficient documentation

## 2024-04-16 DIAGNOSIS — I10 Essential (primary) hypertension: Secondary | ICD-10-CM | POA: Diagnosis not present

## 2024-04-16 DIAGNOSIS — Z8616 Personal history of COVID-19: Secondary | ICD-10-CM | POA: Diagnosis not present

## 2024-04-16 DIAGNOSIS — F32A Depression, unspecified: Secondary | ICD-10-CM | POA: Insufficient documentation

## 2024-04-16 DIAGNOSIS — Z6841 Body Mass Index (BMI) 40.0 and over, adult: Secondary | ICD-10-CM | POA: Insufficient documentation

## 2024-04-16 DIAGNOSIS — Z808 Family history of malignant neoplasm of other organs or systems: Secondary | ICD-10-CM | POA: Diagnosis not present

## 2024-04-16 DIAGNOSIS — M1990 Unspecified osteoarthritis, unspecified site: Secondary | ICD-10-CM | POA: Diagnosis not present

## 2024-04-16 DIAGNOSIS — E669 Obesity, unspecified: Secondary | ICD-10-CM | POA: Diagnosis not present

## 2024-04-16 DIAGNOSIS — Z9049 Acquired absence of other specified parts of digestive tract: Secondary | ICD-10-CM | POA: Diagnosis not present

## 2024-04-16 DIAGNOSIS — Z8249 Family history of ischemic heart disease and other diseases of the circulatory system: Secondary | ICD-10-CM | POA: Insufficient documentation

## 2024-04-16 DIAGNOSIS — Z833 Family history of diabetes mellitus: Secondary | ICD-10-CM | POA: Insufficient documentation

## 2024-04-16 DIAGNOSIS — Z170 Estrogen receptor positive status [ER+]: Secondary | ICD-10-CM | POA: Insufficient documentation

## 2024-04-16 DIAGNOSIS — Z86018 Personal history of other benign neoplasm: Secondary | ICD-10-CM | POA: Insufficient documentation

## 2024-04-16 DIAGNOSIS — Z881 Allergy status to other antibiotic agents status: Secondary | ICD-10-CM | POA: Diagnosis not present

## 2024-04-16 DIAGNOSIS — F419 Anxiety disorder, unspecified: Secondary | ICD-10-CM | POA: Diagnosis not present

## 2024-04-16 DIAGNOSIS — D0512 Intraductal carcinoma in situ of left breast: Secondary | ICD-10-CM | POA: Diagnosis not present

## 2024-04-16 DIAGNOSIS — Z803 Family history of malignant neoplasm of breast: Secondary | ICD-10-CM | POA: Insufficient documentation

## 2024-04-16 DIAGNOSIS — Z510 Encounter for antineoplastic radiation therapy: Secondary | ICD-10-CM | POA: Diagnosis not present

## 2024-04-16 LAB — RAD ONC ARIA SESSION SUMMARY
Course Elapsed Days: 12
Plan Fractions Treated to Date: 8
Plan Prescribed Dose Per Fraction: 2.66 Gy
Plan Total Fractions Prescribed: 16
Plan Total Prescribed Dose: 42.56 Gy
Reference Point Dosage Given to Date: 21.28 Gy
Reference Point Session Dosage Given: 2.66 Gy
Session Number: 8

## 2024-04-17 ENCOUNTER — Ambulatory Visit
Admission: RE | Admit: 2024-04-17 | Discharge: 2024-04-17 | Disposition: A | Discharge status: Home / Self Care | Source: Ambulatory Visit | Attending: Radiation Oncology | Admitting: Radiation Oncology

## 2024-04-17 ENCOUNTER — Telehealth (HOSPITAL_BASED_OUTPATIENT_CLINIC_OR_DEPARTMENT_OTHER): Payer: Self-pay | Admitting: *Deleted

## 2024-04-17 ENCOUNTER — Encounter: Payer: Self-pay | Admitting: Family

## 2024-04-17 ENCOUNTER — Other Ambulatory Visit: Payer: Self-pay

## 2024-04-17 DIAGNOSIS — I1A0 Resistant hypertension: Secondary | ICD-10-CM

## 2024-04-17 DIAGNOSIS — D0512 Intraductal carcinoma in situ of left breast: Secondary | ICD-10-CM | POA: Diagnosis not present

## 2024-04-17 DIAGNOSIS — G4733 Obstructive sleep apnea (adult) (pediatric): Secondary | ICD-10-CM | POA: Diagnosis not present

## 2024-04-17 DIAGNOSIS — Z170 Estrogen receptor positive status [ER+]: Secondary | ICD-10-CM | POA: Diagnosis not present

## 2024-04-17 DIAGNOSIS — Z510 Encounter for antineoplastic radiation therapy: Secondary | ICD-10-CM | POA: Diagnosis not present

## 2024-04-17 LAB — RAD ONC ARIA SESSION SUMMARY
Course Elapsed Days: 13
Plan Fractions Treated to Date: 9
Plan Prescribed Dose Per Fraction: 2.66 Gy
Plan Total Fractions Prescribed: 16
Plan Total Prescribed Dose: 42.56 Gy
Reference Point Dosage Given to Date: 23.94 Gy
Reference Point Session Dosage Given: 2.66 Gy
Session Number: 9

## 2024-04-17 NOTE — Telephone Encounter (Signed)
 Once she responds I am ok with you placing a new cardiology referral to her requested location

## 2024-04-17 NOTE — Telephone Encounter (Signed)
 Patient called today extremely upset that Dr Raford had not viewed her heart monitor  Tried explaining to patient that Dr Raford had not been in the office since it resulted on 8/22 She continued to talk about how upset she was and that she felt it was rude of Dr Raford not to review her results and she did not feel like Dr Raford was taking care of her  I advised that I was sure that it was not Dr Skeeter intentions to make her feel that way and her goal was to take the very best care of ALL her patients   Had Rosaline RAMAN NP review monitor  Predominately sinus rhythm, rare PAC's & PVC's, with some short runs of fast heart rates  She does not recommend any changes in medications based on results  Left message to call back and mychart message to patient

## 2024-04-18 ENCOUNTER — Other Ambulatory Visit: Payer: Self-pay

## 2024-04-18 ENCOUNTER — Ambulatory Visit
Admission: RE | Admit: 2024-04-18 | Discharge: 2024-04-18 | Disposition: A | Discharge status: Home / Self Care | Source: Ambulatory Visit | Attending: Radiation Oncology | Admitting: Radiation Oncology

## 2024-04-18 DIAGNOSIS — Z823 Family history of stroke: Secondary | ICD-10-CM | POA: Diagnosis not present

## 2024-04-18 DIAGNOSIS — Z510 Encounter for antineoplastic radiation therapy: Secondary | ICD-10-CM | POA: Diagnosis not present

## 2024-04-18 DIAGNOSIS — Z170 Estrogen receptor positive status [ER+]: Secondary | ICD-10-CM | POA: Diagnosis not present

## 2024-04-18 DIAGNOSIS — Z833 Family history of diabetes mellitus: Secondary | ICD-10-CM | POA: Diagnosis not present

## 2024-04-18 DIAGNOSIS — D0512 Intraductal carcinoma in situ of left breast: Secondary | ICD-10-CM | POA: Diagnosis not present

## 2024-04-18 DIAGNOSIS — Z881 Allergy status to other antibiotic agents status: Secondary | ICD-10-CM | POA: Diagnosis not present

## 2024-04-18 LAB — RAD ONC ARIA SESSION SUMMARY
Course Elapsed Days: 14
Plan Fractions Treated to Date: 10
Plan Prescribed Dose Per Fraction: 2.66 Gy
Plan Total Fractions Prescribed: 16
Plan Total Prescribed Dose: 42.56 Gy
Reference Point Dosage Given to Date: 26.6 Gy
Reference Point Session Dosage Given: 2.66 Gy
Session Number: 10

## 2024-04-18 NOTE — Telephone Encounter (Signed)
Patient viewed in mychart.

## 2024-04-18 NOTE — Addendum Note (Signed)
 Addended by: ALBINO SHAVER C on: 04/18/2024 09:23 AM   Modules accepted: Orders

## 2024-04-19 ENCOUNTER — Telehealth: Payer: Self-pay | Admitting: Cardiovascular Disease

## 2024-04-19 ENCOUNTER — Ambulatory Visit
Admission: RE | Admit: 2024-04-19 | Discharge: 2024-04-19 | Disposition: A | Discharge status: Home / Self Care | Source: Ambulatory Visit | Attending: Radiation Oncology | Admitting: Radiation Oncology

## 2024-04-19 ENCOUNTER — Other Ambulatory Visit: Payer: Self-pay

## 2024-04-19 ENCOUNTER — Encounter: Payer: Self-pay | Admitting: Cardiology

## 2024-04-19 DIAGNOSIS — Z170 Estrogen receptor positive status [ER+]: Secondary | ICD-10-CM | POA: Diagnosis not present

## 2024-04-19 DIAGNOSIS — Z510 Encounter for antineoplastic radiation therapy: Secondary | ICD-10-CM | POA: Diagnosis not present

## 2024-04-19 DIAGNOSIS — D0512 Intraductal carcinoma in situ of left breast: Secondary | ICD-10-CM | POA: Diagnosis not present

## 2024-04-19 LAB — RAD ONC ARIA SESSION SUMMARY
Course Elapsed Days: 15
Plan Fractions Treated to Date: 11
Plan Prescribed Dose Per Fraction: 2.66 Gy
Plan Total Fractions Prescribed: 16
Plan Total Prescribed Dose: 42.56 Gy
Reference Point Dosage Given to Date: 29.26 Gy
Reference Point Session Dosage Given: 2.66 Gy
Session Number: 11

## 2024-04-19 NOTE — Telephone Encounter (Signed)
 Error

## 2024-04-19 NOTE — Telephone Encounter (Signed)
 Pt called in requesting to switch to Dr. Shlomo for sleep apnea (CPAP management) and gen card care, is this switch okay?

## 2024-04-19 NOTE — Telephone Encounter (Signed)
 Hi Dr. Argentina, this pt is trying to be closer to the [facility] office, are you okay to take over her care?

## 2024-04-19 NOTE — Progress Notes (Signed)
 noted

## 2024-04-22 ENCOUNTER — Ambulatory Visit
Admission: RE | Admit: 2024-04-22 | Discharge: 2024-04-22 | Disposition: A | Discharge status: Home / Self Care | Source: Ambulatory Visit | Attending: Radiation Oncology | Admitting: Radiation Oncology

## 2024-04-22 ENCOUNTER — Other Ambulatory Visit: Payer: Self-pay

## 2024-04-22 DIAGNOSIS — Z823 Family history of stroke: Secondary | ICD-10-CM | POA: Diagnosis not present

## 2024-04-22 DIAGNOSIS — Z9049 Acquired absence of other specified parts of digestive tract: Secondary | ICD-10-CM | POA: Diagnosis not present

## 2024-04-22 DIAGNOSIS — I10 Essential (primary) hypertension: Secondary | ICD-10-CM | POA: Diagnosis not present

## 2024-04-22 DIAGNOSIS — Z6841 Body Mass Index (BMI) 40.0 and over, adult: Secondary | ICD-10-CM | POA: Diagnosis not present

## 2024-04-22 DIAGNOSIS — M1990 Unspecified osteoarthritis, unspecified site: Secondary | ICD-10-CM | POA: Diagnosis not present

## 2024-04-22 DIAGNOSIS — Z808 Family history of malignant neoplasm of other organs or systems: Secondary | ICD-10-CM | POA: Diagnosis not present

## 2024-04-22 DIAGNOSIS — D0512 Intraductal carcinoma in situ of left breast: Secondary | ICD-10-CM | POA: Diagnosis not present

## 2024-04-22 DIAGNOSIS — Z86018 Personal history of other benign neoplasm: Secondary | ICD-10-CM | POA: Diagnosis not present

## 2024-04-22 DIAGNOSIS — F419 Anxiety disorder, unspecified: Secondary | ICD-10-CM | POA: Diagnosis not present

## 2024-04-22 DIAGNOSIS — G4730 Sleep apnea, unspecified: Secondary | ICD-10-CM | POA: Diagnosis not present

## 2024-04-22 DIAGNOSIS — Z170 Estrogen receptor positive status [ER+]: Secondary | ICD-10-CM | POA: Diagnosis not present

## 2024-04-22 DIAGNOSIS — Z881 Allergy status to other antibiotic agents status: Secondary | ICD-10-CM | POA: Diagnosis not present

## 2024-04-22 DIAGNOSIS — Z803 Family history of malignant neoplasm of breast: Secondary | ICD-10-CM | POA: Diagnosis not present

## 2024-04-22 DIAGNOSIS — J45909 Unspecified asthma, uncomplicated: Secondary | ICD-10-CM | POA: Diagnosis not present

## 2024-04-22 DIAGNOSIS — E669 Obesity, unspecified: Secondary | ICD-10-CM | POA: Diagnosis not present

## 2024-04-22 DIAGNOSIS — F32A Depression, unspecified: Secondary | ICD-10-CM | POA: Diagnosis not present

## 2024-04-22 DIAGNOSIS — Z8616 Personal history of COVID-19: Secondary | ICD-10-CM | POA: Diagnosis not present

## 2024-04-22 DIAGNOSIS — Z79899 Other long term (current) drug therapy: Secondary | ICD-10-CM | POA: Diagnosis not present

## 2024-04-22 DIAGNOSIS — Z510 Encounter for antineoplastic radiation therapy: Secondary | ICD-10-CM | POA: Diagnosis not present

## 2024-04-22 DIAGNOSIS — Z8249 Family history of ischemic heart disease and other diseases of the circulatory system: Secondary | ICD-10-CM | POA: Diagnosis not present

## 2024-04-22 LAB — RAD ONC ARIA SESSION SUMMARY
Course Elapsed Days: 18
Plan Fractions Treated to Date: 12
Plan Prescribed Dose Per Fraction: 2.66 Gy
Plan Total Fractions Prescribed: 16
Plan Total Prescribed Dose: 42.56 Gy
Reference Point Dosage Given to Date: 31.92 Gy
Reference Point Session Dosage Given: 2.66 Gy
Session Number: 12

## 2024-04-23 ENCOUNTER — Ambulatory Visit
Admission: RE | Admit: 2024-04-23 | Discharge: 2024-04-23 | Disposition: A | Discharge status: Home / Self Care | Source: Ambulatory Visit | Attending: Radiation Oncology | Admitting: Radiation Oncology

## 2024-04-23 ENCOUNTER — Other Ambulatory Visit: Payer: Self-pay

## 2024-04-23 DIAGNOSIS — Z510 Encounter for antineoplastic radiation therapy: Secondary | ICD-10-CM | POA: Diagnosis not present

## 2024-04-23 DIAGNOSIS — J45909 Unspecified asthma, uncomplicated: Secondary | ICD-10-CM | POA: Diagnosis not present

## 2024-04-23 DIAGNOSIS — Z823 Family history of stroke: Secondary | ICD-10-CM | POA: Diagnosis not present

## 2024-04-23 DIAGNOSIS — Z8249 Family history of ischemic heart disease and other diseases of the circulatory system: Secondary | ICD-10-CM | POA: Diagnosis not present

## 2024-04-23 DIAGNOSIS — Z9049 Acquired absence of other specified parts of digestive tract: Secondary | ICD-10-CM | POA: Diagnosis not present

## 2024-04-23 DIAGNOSIS — Z803 Family history of malignant neoplasm of breast: Secondary | ICD-10-CM | POA: Diagnosis not present

## 2024-04-23 DIAGNOSIS — Z881 Allergy status to other antibiotic agents status: Secondary | ICD-10-CM | POA: Diagnosis not present

## 2024-04-23 DIAGNOSIS — D0512 Intraductal carcinoma in situ of left breast: Secondary | ICD-10-CM | POA: Diagnosis not present

## 2024-04-23 DIAGNOSIS — F32A Depression, unspecified: Secondary | ICD-10-CM | POA: Diagnosis not present

## 2024-04-23 DIAGNOSIS — E669 Obesity, unspecified: Secondary | ICD-10-CM | POA: Diagnosis not present

## 2024-04-23 DIAGNOSIS — Z86018 Personal history of other benign neoplasm: Secondary | ICD-10-CM | POA: Diagnosis not present

## 2024-04-23 DIAGNOSIS — Z808 Family history of malignant neoplasm of other organs or systems: Secondary | ICD-10-CM | POA: Diagnosis not present

## 2024-04-23 DIAGNOSIS — Z79899 Other long term (current) drug therapy: Secondary | ICD-10-CM | POA: Diagnosis not present

## 2024-04-23 DIAGNOSIS — F419 Anxiety disorder, unspecified: Secondary | ICD-10-CM | POA: Diagnosis not present

## 2024-04-23 DIAGNOSIS — Z6841 Body Mass Index (BMI) 40.0 and over, adult: Secondary | ICD-10-CM | POA: Diagnosis not present

## 2024-04-23 DIAGNOSIS — M1990 Unspecified osteoarthritis, unspecified site: Secondary | ICD-10-CM | POA: Diagnosis not present

## 2024-04-23 DIAGNOSIS — Z8616 Personal history of COVID-19: Secondary | ICD-10-CM | POA: Diagnosis not present

## 2024-04-23 DIAGNOSIS — Z170 Estrogen receptor positive status [ER+]: Secondary | ICD-10-CM | POA: Diagnosis not present

## 2024-04-23 DIAGNOSIS — G4730 Sleep apnea, unspecified: Secondary | ICD-10-CM | POA: Diagnosis not present

## 2024-04-23 DIAGNOSIS — I10 Essential (primary) hypertension: Secondary | ICD-10-CM | POA: Diagnosis not present

## 2024-04-23 DIAGNOSIS — Z833 Family history of diabetes mellitus: Secondary | ICD-10-CM | POA: Diagnosis not present

## 2024-04-23 LAB — RAD ONC ARIA SESSION SUMMARY
Course Elapsed Days: 19
Plan Fractions Treated to Date: 13
Plan Prescribed Dose Per Fraction: 2.66 Gy
Plan Total Fractions Prescribed: 16
Plan Total Prescribed Dose: 42.56 Gy
Reference Point Dosage Given to Date: 34.58 Gy
Reference Point Session Dosage Given: 2.66 Gy
Session Number: 13

## 2024-04-24 ENCOUNTER — Other Ambulatory Visit: Payer: Self-pay

## 2024-04-24 ENCOUNTER — Inpatient Hospital Stay

## 2024-04-24 ENCOUNTER — Ambulatory Visit
Admission: RE | Admit: 2024-04-24 | Discharge: 2024-04-24 | Disposition: A | Discharge status: Home / Self Care | Source: Ambulatory Visit | Attending: Radiation Oncology | Admitting: Radiation Oncology

## 2024-04-24 DIAGNOSIS — Z170 Estrogen receptor positive status [ER+]: Secondary | ICD-10-CM | POA: Diagnosis not present

## 2024-04-24 DIAGNOSIS — Z6841 Body Mass Index (BMI) 40.0 and over, adult: Secondary | ICD-10-CM | POA: Diagnosis not present

## 2024-04-24 DIAGNOSIS — I10 Essential (primary) hypertension: Secondary | ICD-10-CM | POA: Diagnosis not present

## 2024-04-24 DIAGNOSIS — F419 Anxiety disorder, unspecified: Secondary | ICD-10-CM | POA: Diagnosis not present

## 2024-04-24 DIAGNOSIS — Z510 Encounter for antineoplastic radiation therapy: Secondary | ICD-10-CM | POA: Diagnosis not present

## 2024-04-24 DIAGNOSIS — Z79899 Other long term (current) drug therapy: Secondary | ICD-10-CM | POA: Diagnosis not present

## 2024-04-24 DIAGNOSIS — Z823 Family history of stroke: Secondary | ICD-10-CM | POA: Diagnosis not present

## 2024-04-24 DIAGNOSIS — Z8616 Personal history of COVID-19: Secondary | ICD-10-CM | POA: Diagnosis not present

## 2024-04-24 DIAGNOSIS — D0512 Intraductal carcinoma in situ of left breast: Secondary | ICD-10-CM | POA: Diagnosis not present

## 2024-04-24 DIAGNOSIS — Z803 Family history of malignant neoplasm of breast: Secondary | ICD-10-CM | POA: Diagnosis not present

## 2024-04-24 DIAGNOSIS — Z86018 Personal history of other benign neoplasm: Secondary | ICD-10-CM | POA: Diagnosis not present

## 2024-04-24 DIAGNOSIS — E669 Obesity, unspecified: Secondary | ICD-10-CM | POA: Diagnosis not present

## 2024-04-24 DIAGNOSIS — Z9049 Acquired absence of other specified parts of digestive tract: Secondary | ICD-10-CM | POA: Diagnosis not present

## 2024-04-24 DIAGNOSIS — J45909 Unspecified asthma, uncomplicated: Secondary | ICD-10-CM | POA: Diagnosis not present

## 2024-04-24 DIAGNOSIS — M1990 Unspecified osteoarthritis, unspecified site: Secondary | ICD-10-CM | POA: Diagnosis not present

## 2024-04-24 DIAGNOSIS — Z833 Family history of diabetes mellitus: Secondary | ICD-10-CM | POA: Diagnosis not present

## 2024-04-24 DIAGNOSIS — Z808 Family history of malignant neoplasm of other organs or systems: Secondary | ICD-10-CM | POA: Diagnosis not present

## 2024-04-24 DIAGNOSIS — Z881 Allergy status to other antibiotic agents status: Secondary | ICD-10-CM | POA: Diagnosis not present

## 2024-04-24 DIAGNOSIS — F32A Depression, unspecified: Secondary | ICD-10-CM | POA: Diagnosis not present

## 2024-04-24 DIAGNOSIS — G4730 Sleep apnea, unspecified: Secondary | ICD-10-CM | POA: Diagnosis not present

## 2024-04-24 LAB — CBC (CANCER CENTER ONLY)
HCT: 40.3 % (ref 36.0–46.0)
Hemoglobin: 14 g/dL (ref 12.0–15.0)
MCH: 31 pg (ref 26.0–34.0)
MCHC: 34.7 g/dL (ref 30.0–36.0)
MCV: 89.2 fL (ref 80.0–100.0)
Platelet Count: 275 K/uL (ref 150–400)
RBC: 4.52 MIL/uL (ref 3.87–5.11)
RDW: 12.3 % (ref 11.5–15.5)
WBC Count: 7.9 K/uL (ref 4.0–10.5)
nRBC: 0 % (ref 0.0–0.2)

## 2024-04-24 LAB — RAD ONC ARIA SESSION SUMMARY
Course Elapsed Days: 20
Plan Fractions Treated to Date: 14
Plan Prescribed Dose Per Fraction: 2.66 Gy
Plan Total Fractions Prescribed: 16
Plan Total Prescribed Dose: 42.56 Gy
Reference Point Dosage Given to Date: 37.24 Gy
Reference Point Session Dosage Given: 2.66 Gy
Session Number: 14

## 2024-04-24 NOTE — Progress Notes (Unsigned)
 Cardiology Office Note   Date:  04/26/2024  ID:  Amy Watson, DOB Aug 13, 1971, MRN [id number] PCP: Corwin Antu, FNP  [facility] Providers Cardiologist:  Caron Poser, MD     History of Present Illness Amy Watson is a 53 y.o. female PMH HFmrEF, resistant HTN, OSA, breast cancer s/p lumpectomy 02/2024 currently undergoing radiotherapy who presents for to establish care.  Patient reports she does overall been feeling unwell recently.  Intermittent dyspnea and fatigue.  She recently had a lumpectomy for breast cancer and is undergoing radiotherapy.  Denies orthopnea or PND.  Denies significant LE edema.  Occasional palpitations.  No chest discomfort.  Last LDL 86 08/2023.  Relevant CVD History -Monitor 03/2024 mean heart rate 87 bpm, rare PACs/PVCs, 3 episodes NSVT (longest 7 beats) -High renin activity, normal aldosterone with normal ratio 02/2024 - Normal renal artery duplex 10/2023 - Mildly reduced LVEF 45% with mild MR and mild TR TTE 06/2018 ([facility])    ROS: Pt denies any chest discomfort, jaw pain, arm pain, palpitations, syncope, presyncope, orthopnea, PND, or LE edema.  Studies Reviewed I have independently reviewed the patient's ECG, previous cardiovascular testing, recent blood work, and prior medical records.  Physical Exam VS:  BP 118/74 (BP Location: Left Arm, Patient Position: Sitting, Cuff Size: Normal)   Pulse 99   Wt 263 lb (119.3 kg)   LMP 08/15/2021   SpO2 98%   BMI 42.45 kg/m        Wt Readings from Last 3 Encounters:  04/26/24 263 lb (119.3 kg)  03/21/24 254 lb (115.2 kg)  03/18/24 256 lb 1.6 oz (116.2 kg)    GEN: No acute distress. NECK: No JVD; No carotid bruits. CARDIAC: Regular rate, frequent premature beats, no murmurs, rubs, gallops. RESPIRATORY:  Clear to auscultation. EXTREMITIES:  Warm and well-perfused. No edema.  ASSESSMENT AND PLAN NSVT Frequent PVCs Overall low burden of NSVT and PVCs on recent monitor 02/2024.   However, her ECG in office today shows extremely frequent PVCs with about a 40% burden.  No obvious electrolyte abnormalities to explain.  Prior echo showed reduced EF, so this could be etiology.  She sometimes has pulse oximeter readings which indicate a low pulse; I think this is probably falsely low due to frequent PVCs and does not actually indicate presence of bradycardia.  Plan: - Short-term repeat monitor to reassess PVC burden - If this monitor confirms frequent PVCs, then we will start her on low-dose metoprolol XL 25 mg daily and uptitrate.   HFmrEF Previously reported LVEF 45% from an echo done at  clinic in 2019.  Already taking ARB and Aldactone .  She does not have obvious heart failure symptoms currently, but this was never followed up on.  Plan: - Repeat echocardiogram to assess LV function - If EF persistently reduced, then we can switch her from valsartan  to entresto if BP is still elevated.  As above, would also start her on metoprolol if this is the case - If the EF is still reduced, then ischemic evaluation would be indicated.  Coronary CT angiogram would be a good test for her.  Resistant HTN Morbid obesity OSA Well-controlled today, though on 4 medications.  Secondary hypertension workup has been negative with unremarkable renin-aldosterone testing and renal artery duplex.  Likely being driven by metabolic syndrome and OSA.  She would be a good candidate for GLP-1 agonist.  Plan: - Start Zepbound  given obesity, previous history of HF, and known OSA - Continue CPAP - Continue  Norvasc  10 mg daily, HCTZ 25 mg daily, valsartan  320 mg daily, and spironolactone  25 mg daily         Dispo: RTC 2 months or sooner as needed  Signed, Caron Poser, MD

## 2024-04-25 ENCOUNTER — Other Ambulatory Visit: Payer: Self-pay

## 2024-04-25 ENCOUNTER — Ambulatory Visit
Admission: RE | Admit: 2024-04-25 | Discharge: 2024-04-25 | Disposition: A | Discharge status: Home / Self Care | Source: Ambulatory Visit | Attending: Radiation Oncology | Admitting: Radiation Oncology

## 2024-04-25 DIAGNOSIS — Z881 Allergy status to other antibiotic agents status: Secondary | ICD-10-CM | POA: Diagnosis not present

## 2024-04-25 DIAGNOSIS — D0512 Intraductal carcinoma in situ of left breast: Secondary | ICD-10-CM | POA: Diagnosis not present

## 2024-04-25 DIAGNOSIS — Z510 Encounter for antineoplastic radiation therapy: Secondary | ICD-10-CM | POA: Diagnosis not present

## 2024-04-25 DIAGNOSIS — Z170 Estrogen receptor positive status [ER+]: Secondary | ICD-10-CM | POA: Diagnosis not present

## 2024-04-25 LAB — RAD ONC ARIA SESSION SUMMARY
Course Elapsed Days: 21
Plan Fractions Treated to Date: 15
Plan Prescribed Dose Per Fraction: 2.66 Gy
Plan Total Fractions Prescribed: 16
Plan Total Prescribed Dose: 42.56 Gy
Reference Point Dosage Given to Date: 39.9 Gy
Reference Point Session Dosage Given: 2.66 Gy
Session Number: 15

## 2024-04-26 ENCOUNTER — Other Ambulatory Visit: Payer: Self-pay

## 2024-04-26 ENCOUNTER — Ambulatory Visit
Admission: RE | Admit: 2024-04-26 | Discharge: 2024-04-26 | Disposition: A | Discharge status: Home / Self Care | Source: Ambulatory Visit | Attending: Radiation Oncology | Admitting: Radiation Oncology

## 2024-04-26 ENCOUNTER — Ambulatory Visit (INDEPENDENT_AMBULATORY_CARE_PROVIDER_SITE_OTHER)

## 2024-04-26 ENCOUNTER — Ambulatory Visit

## 2024-04-26 VITALS — BP 118/74 | HR 99 | Wt 263.0 lb

## 2024-04-26 DIAGNOSIS — Z79899 Other long term (current) drug therapy: Secondary | ICD-10-CM | POA: Diagnosis not present

## 2024-04-26 DIAGNOSIS — I4729 Other ventricular tachycardia: Secondary | ICD-10-CM

## 2024-04-26 DIAGNOSIS — E669 Obesity, unspecified: Secondary | ICD-10-CM | POA: Diagnosis not present

## 2024-04-26 DIAGNOSIS — Z808 Family history of malignant neoplasm of other organs or systems: Secondary | ICD-10-CM | POA: Diagnosis not present

## 2024-04-26 DIAGNOSIS — Z8249 Family history of ischemic heart disease and other diseases of the circulatory system: Secondary | ICD-10-CM | POA: Diagnosis not present

## 2024-04-26 DIAGNOSIS — I493 Ventricular premature depolarization: Secondary | ICD-10-CM

## 2024-04-26 DIAGNOSIS — I10 Essential (primary) hypertension: Secondary | ICD-10-CM | POA: Diagnosis not present

## 2024-04-26 DIAGNOSIS — Z510 Encounter for antineoplastic radiation therapy: Secondary | ICD-10-CM | POA: Diagnosis not present

## 2024-04-26 DIAGNOSIS — I5022 Chronic systolic (congestive) heart failure: Secondary | ICD-10-CM | POA: Diagnosis not present

## 2024-04-26 DIAGNOSIS — Z8616 Personal history of COVID-19: Secondary | ICD-10-CM | POA: Diagnosis not present

## 2024-04-26 DIAGNOSIS — F419 Anxiety disorder, unspecified: Secondary | ICD-10-CM | POA: Diagnosis not present

## 2024-04-26 DIAGNOSIS — Z823 Family history of stroke: Secondary | ICD-10-CM | POA: Diagnosis not present

## 2024-04-26 DIAGNOSIS — Z6841 Body Mass Index (BMI) 40.0 and over, adult: Secondary | ICD-10-CM | POA: Diagnosis not present

## 2024-04-26 DIAGNOSIS — Z881 Allergy status to other antibiotic agents status: Secondary | ICD-10-CM | POA: Diagnosis not present

## 2024-04-26 DIAGNOSIS — M1990 Unspecified osteoarthritis, unspecified site: Secondary | ICD-10-CM | POA: Diagnosis not present

## 2024-04-26 DIAGNOSIS — I1A0 Resistant hypertension: Secondary | ICD-10-CM

## 2024-04-26 DIAGNOSIS — G4733 Obstructive sleep apnea (adult) (pediatric): Secondary | ICD-10-CM

## 2024-04-26 DIAGNOSIS — Z86018 Personal history of other benign neoplasm: Secondary | ICD-10-CM | POA: Diagnosis not present

## 2024-04-26 DIAGNOSIS — Z833 Family history of diabetes mellitus: Secondary | ICD-10-CM | POA: Diagnosis not present

## 2024-04-26 DIAGNOSIS — J45909 Unspecified asthma, uncomplicated: Secondary | ICD-10-CM | POA: Diagnosis not present

## 2024-04-26 DIAGNOSIS — G4730 Sleep apnea, unspecified: Secondary | ICD-10-CM | POA: Diagnosis not present

## 2024-04-26 DIAGNOSIS — D0512 Intraductal carcinoma in situ of left breast: Secondary | ICD-10-CM | POA: Diagnosis not present

## 2024-04-26 DIAGNOSIS — Z170 Estrogen receptor positive status [ER+]: Secondary | ICD-10-CM | POA: Diagnosis not present

## 2024-04-26 DIAGNOSIS — Z803 Family history of malignant neoplasm of breast: Secondary | ICD-10-CM | POA: Diagnosis not present

## 2024-04-26 DIAGNOSIS — Z9049 Acquired absence of other specified parts of digestive tract: Secondary | ICD-10-CM | POA: Diagnosis not present

## 2024-04-26 DIAGNOSIS — F32A Depression, unspecified: Secondary | ICD-10-CM | POA: Diagnosis not present

## 2024-04-26 LAB — RAD ONC ARIA SESSION SUMMARY
Course Elapsed Days: 22
Plan Fractions Treated to Date: 16
Plan Prescribed Dose Per Fraction: 2.66 Gy
Plan Total Fractions Prescribed: 16
Plan Total Prescribed Dose: 42.56 Gy
Reference Point Dosage Given to Date: 42.56 Gy
Reference Point Session Dosage Given: 2.66 Gy
Session Number: 16

## 2024-04-26 MED ORDER — ZEPBOUND 2.5 MG/0.5ML ~~LOC~~ SOAJ: SUBCUTANEOUS | 0 refills | Status: DC

## 2024-04-26 MED ORDER — ZEPBOUND 2.5 MG/0.5ML ~~LOC~~ SOAJ
2.5000 mg | SUBCUTANEOUS | 0 refills | Status: AC
  Filled 2024-04-26 – 2024-05-02 (×2): qty 2, 28d supply, fill #0

## 2024-04-26 NOTE — Patient Instructions (Addendum)
 Medication Instructions:   Your physician recommends the following medication changes.  START TAKING: Tirzepatide  (ZepBound ) 2.5 mg/0.5 ml PEN  Multiple Dosages: Starting Fri 04/26/2024, Until Thu 05/23/2024 at 2359,  THEN Starting Fri 05/24/2024, Until Thu 06/20/2024 at 2359,  THEN Starting Fri 06/21/2024, Until Thu 07/18/2024 at 2359,  THEN Starting Fri 07/19/2024, Until Thu 08/15/2024 at 2359 Inject 2.5 mg into the skin once a week for 28 days,  THEN 5 mg once a week for 28 days,  THEN 7.5 mg once a week for 28 days,  THEN 10 mg once a week for 28 days., Normal   Continue all other current medications as prescribed.  *If you need a refill on your cardiac medications before your next appointment, please call your pharmacy*  Lab Work:  No labs ordered today   If you have labs (blood work) drawn today and your tests are completely normal, you will receive your results only by: MyChart Message (if you have MyChart) OR A paper copy in the mail If you have any lab test that is abnormal or we need to change your treatment, we will call you to review the results.  Testing/Procedures:  Your physician has requested that you have an echocardiogram. Echocardiography is a painless test that uses sound waves to create images of your heart. It provides your doctor with information about the size and shape of your heart and how well your heart's chambers and valves are working.   You may receive an ultrasound enhancing agent through an IV if needed to better visualize your heart during the echo. This procedure takes approximately one hour.  There are no restrictions for this procedure.  This will take place at [address] [facility] [address], [place] [address]  Please note: We ask at that you not bring children with you during ultrasound (echo/ vascular) testing. Due to room size and safety concerns, children are not allowed in the ultrasound rooms during exams. Our front office  staff cannot provide observation of children in our lobby area while testing is being conducted. An adult accompanying a patient to their appointment will only be allowed in the ultrasound room at the discretion of the ultrasound technician under special circumstances. We apologize for any inconvenience.   ZIO XT- Long Term Monitor Instructions  Your physician has requested you wear a ZIO patch monitor for 7 days.  This is a single patch monitor. [employer] supplies one patch monitor per enrollment. Additional stickers are not available. Please do not apply patch if you will be having a Nuclear Stress Test, Echocardiogram, Cardiac CT, MRI, or Chest Xray during the period you would be wearing the monitor. The patch cannot be worn during these tests. You cannot remove and re-apply the ZIO XT patch monitor.  Your ZIO patch monitor will be mailed 3 day USPS to your address on file. It may take 3-5 days to receive your monitor after you have been enrolled. Once you have received your monitor, please review the enclosed instructions. Your monitor has already been registered assigning a specific monitor serial number to you.  Billing and Patient Assistance Program Information  We have supplied [employer] with any of your insurance information on file for billing purposes.  [employer] offers a sliding scale Patient Assistance Program for patients that do not have insurance, or whose insurance does not completely cover the cost of the ZIO monitor.  You must apply for the Patient Assistance Program to qualify for this discounted rate.  To  apply, please call [employer] at [phone], select option 4, select option 2, ask to apply for Patient Assistance Program. Meredeth will ask your household income, and how many people are in your household. They will quote your out-of-pocket cost based on that information. [employer] will also be able to set up a 85-month, interest-free payment plan if needed.  Applying the monitor  Hold  abrader disc by orange tab. Rub abrader in 40 strokes over the upper left chest as indicated in your monitor instructions.  Clean area with 4 enclosed alcohol pads. Let dry.  Apply patch as indicated in monitor instructions. Patch will be placed under collarbone on left side of chest with arrow pointing upward.  Rub patch adhesive wings for 2 minutes. Remove white label marked 1. Remove the white label marked 2. Rub patch adhesive wings for 2 additional minutes.  While looking in a mirror, press and release button in center of patch. A small green light will flash 3-4 times. This will be your only indicator that the monitor has been turned on.   After Applying Monitor: Do not shower for the first 24 hours. You may shower after the first 24 hours.  Press the button if you feel a symptom. You will hear a small click. Record Date, Time and Symptom in the Patient Logbook.   AFTER 7 Days: When you are ready to remove the patch, follow instructions on the last 2 pages of Patient Logbook.  Stick patch monitor into the tabs at the bottom of the return box.  Place Patient Logbook in the blue and white box. Use locking tab on box and tape box closed securely. The blue and white box has prepaid postage on it. Please place it in the mailbox as soon as possible. Your physician should have your test results approximately 7-14 days after the monitor has been mailed back to [facility].  Call [facility] Customer Care at [phone] if you have questions regarding your ZIO XT patch monitor.  Call them immediately if you see an orange light blinking on your monitor.  If your monitor falls off in less than 4 days, contact our Monitor department at [phone].  If your monitor becomes loose or falls off after 4 days call [employer] at [phone] for suggestions on securing your monitor.   Follow-Up: At [facility], you and your health needs are our priority.  As part of our continuing  mission to provide you with exceptional heart care, our providers are all part of one team.  This team includes your primary Cardiologist (physician) and Advanced Practice Providers or APPs (Physician Assistants and Nurse Practitioners) who all work together to provide you with the care you need, when you need it.  Your next appointment:   2 month(s)  Provider:   Caron Poser, MD   We recommend signing up for the patient portal called MyChart.  Sign up information is provided on this After Visit Summary.  MyChart is used to connect with patients for Virtual Visits (Telemedicine).  Patients are able to view lab/test results, encounter notes, upcoming appointments, etc.  Non-urgent messages can be sent to your provider as well.   To learn more about what you can do with MyChart, go to [email or web address].

## 2024-04-29 ENCOUNTER — Ambulatory Visit
Admission: RE | Admit: 2024-04-29 | Discharge: 2024-04-29 | Disposition: A | Discharge status: Home / Self Care | Source: Ambulatory Visit | Attending: Radiation Oncology | Admitting: Radiation Oncology

## 2024-04-29 ENCOUNTER — Other Ambulatory Visit: Payer: Self-pay

## 2024-04-29 DIAGNOSIS — D0512 Intraductal carcinoma in situ of left breast: Secondary | ICD-10-CM | POA: Diagnosis not present

## 2024-04-29 DIAGNOSIS — Z86018 Personal history of other benign neoplasm: Secondary | ICD-10-CM | POA: Diagnosis not present

## 2024-04-29 LAB — RAD ONC ARIA SESSION SUMMARY
Course Elapsed Days: 25
Plan Fractions Treated to Date: 1
Plan Prescribed Dose Per Fraction: 2 Gy
Plan Total Fractions Prescribed: 5
Plan Total Prescribed Dose: 10 Gy
Reference Point Dosage Given to Date: 2 Gy
Reference Point Session Dosage Given: 2 Gy
Session Number: 17

## 2024-04-30 ENCOUNTER — Ambulatory Visit
Admission: RE | Admit: 2024-04-30 | Discharge: 2024-04-30 | Disposition: A | Discharge status: Home / Self Care | Source: Ambulatory Visit | Attending: Radiation Oncology | Admitting: Radiation Oncology

## 2024-04-30 ENCOUNTER — Other Ambulatory Visit: Payer: Self-pay

## 2024-04-30 DIAGNOSIS — D0512 Intraductal carcinoma in situ of left breast: Secondary | ICD-10-CM | POA: Diagnosis not present

## 2024-04-30 LAB — RAD ONC ARIA SESSION SUMMARY
Course Elapsed Days: 26
Plan Fractions Treated to Date: 2
Plan Prescribed Dose Per Fraction: 2 Gy
Plan Total Fractions Prescribed: 5
Plan Total Prescribed Dose: 10 Gy
Reference Point Dosage Given to Date: 4 Gy
Reference Point Session Dosage Given: 2 Gy
Session Number: 18

## 2024-05-01 ENCOUNTER — Other Ambulatory Visit: Payer: Self-pay

## 2024-05-01 ENCOUNTER — Ambulatory Visit
Admission: RE | Admit: 2024-05-01 | Discharge: 2024-05-01 | Discharge status: Home / Self Care | Source: Ambulatory Visit | Attending: Radiation Oncology | Admitting: Radiation Oncology

## 2024-05-01 DIAGNOSIS — D0512 Intraductal carcinoma in situ of left breast: Secondary | ICD-10-CM | POA: Diagnosis not present

## 2024-05-01 LAB — RAD ONC ARIA SESSION SUMMARY
Course Elapsed Days: 27
Plan Fractions Treated to Date: 3
Plan Prescribed Dose Per Fraction: 2 Gy
Plan Total Fractions Prescribed: 5
Plan Total Prescribed Dose: 10 Gy
Reference Point Dosage Given to Date: 6 Gy
Reference Point Session Dosage Given: 2 Gy
Session Number: 19

## 2024-05-02 ENCOUNTER — Other Ambulatory Visit: Payer: Self-pay

## 2024-05-02 ENCOUNTER — Telehealth: Payer: Self-pay | Admitting: Pharmacy Technician

## 2024-05-02 ENCOUNTER — Other Ambulatory Visit (HOSPITAL_COMMUNITY): Payer: Self-pay

## 2024-05-02 ENCOUNTER — Ambulatory Visit
Admission: RE | Admit: 2024-05-02 | Discharge: 2024-05-02 | Discharge status: Home / Self Care | Source: Ambulatory Visit | Attending: Radiation Oncology | Admitting: Radiation Oncology

## 2024-05-02 DIAGNOSIS — D0512 Intraductal carcinoma in situ of left breast: Secondary | ICD-10-CM | POA: Diagnosis not present

## 2024-05-02 DIAGNOSIS — Z881 Allergy status to other antibiotic agents status: Secondary | ICD-10-CM | POA: Diagnosis not present

## 2024-05-02 DIAGNOSIS — G4730 Sleep apnea, unspecified: Secondary | ICD-10-CM | POA: Diagnosis not present

## 2024-05-02 DIAGNOSIS — Z808 Family history of malignant neoplasm of other organs or systems: Secondary | ICD-10-CM | POA: Diagnosis not present

## 2024-05-02 DIAGNOSIS — Z833 Family history of diabetes mellitus: Secondary | ICD-10-CM | POA: Diagnosis not present

## 2024-05-02 DIAGNOSIS — Z6841 Body Mass Index (BMI) 40.0 and over, adult: Secondary | ICD-10-CM | POA: Diagnosis not present

## 2024-05-02 DIAGNOSIS — J45909 Unspecified asthma, uncomplicated: Secondary | ICD-10-CM | POA: Diagnosis not present

## 2024-05-02 DIAGNOSIS — E669 Obesity, unspecified: Secondary | ICD-10-CM | POA: Diagnosis not present

## 2024-05-02 DIAGNOSIS — Z8249 Family history of ischemic heart disease and other diseases of the circulatory system: Secondary | ICD-10-CM | POA: Diagnosis not present

## 2024-05-02 DIAGNOSIS — F419 Anxiety disorder, unspecified: Secondary | ICD-10-CM | POA: Diagnosis not present

## 2024-05-02 DIAGNOSIS — Z86018 Personal history of other benign neoplasm: Secondary | ICD-10-CM | POA: Diagnosis not present

## 2024-05-02 DIAGNOSIS — Z9049 Acquired absence of other specified parts of digestive tract: Secondary | ICD-10-CM | POA: Diagnosis not present

## 2024-05-02 DIAGNOSIS — Z803 Family history of malignant neoplasm of breast: Secondary | ICD-10-CM | POA: Diagnosis not present

## 2024-05-02 DIAGNOSIS — Z79899 Other long term (current) drug therapy: Secondary | ICD-10-CM | POA: Diagnosis not present

## 2024-05-02 DIAGNOSIS — Z510 Encounter for antineoplastic radiation therapy: Secondary | ICD-10-CM | POA: Diagnosis not present

## 2024-05-02 DIAGNOSIS — Z823 Family history of stroke: Secondary | ICD-10-CM | POA: Diagnosis not present

## 2024-05-02 DIAGNOSIS — I10 Essential (primary) hypertension: Secondary | ICD-10-CM | POA: Diagnosis not present

## 2024-05-02 DIAGNOSIS — F32A Depression, unspecified: Secondary | ICD-10-CM | POA: Diagnosis not present

## 2024-05-02 DIAGNOSIS — M1990 Unspecified osteoarthritis, unspecified site: Secondary | ICD-10-CM | POA: Diagnosis not present

## 2024-05-02 DIAGNOSIS — Z170 Estrogen receptor positive status [ER+]: Secondary | ICD-10-CM | POA: Diagnosis not present

## 2024-05-02 DIAGNOSIS — Z8616 Personal history of COVID-19: Secondary | ICD-10-CM | POA: Diagnosis not present

## 2024-05-02 LAB — RAD ONC ARIA SESSION SUMMARY
Course Elapsed Days: 28
Plan Fractions Treated to Date: 4
Plan Prescribed Dose Per Fraction: 2 Gy
Plan Total Fractions Prescribed: 5
Plan Total Prescribed Dose: 10 Gy
Reference Point Dosage Given to Date: 8 Gy
Reference Point Session Dosage Given: 2 Gy
Session Number: 20

## 2024-05-02 NOTE — Telephone Encounter (Signed)
 Pharmacy Patient Advocate Encounter   Received notification from Pt Calls Messages that prior authorization for zepbound  is required/requested.   Insurance verification completed.   The patient is insured through [facility] .   Per pa- Prior Authorization Not Required. Drug is just not covered. Plan/benefit exclusion.   Since insurance is not covering

## 2024-05-03 ENCOUNTER — Other Ambulatory Visit: Payer: Self-pay

## 2024-05-03 ENCOUNTER — Ambulatory Visit
Admission: RE | Admit: 2024-05-03 | Discharge: 2024-05-03 | Disposition: A | Discharge status: Home / Self Care | Source: Ambulatory Visit | Attending: Radiation Oncology | Admitting: Radiation Oncology

## 2024-05-03 DIAGNOSIS — D0512 Intraductal carcinoma in situ of left breast: Secondary | ICD-10-CM | POA: Diagnosis not present

## 2024-05-03 DIAGNOSIS — Z8249 Family history of ischemic heart disease and other diseases of the circulatory system: Secondary | ICD-10-CM | POA: Diagnosis not present

## 2024-05-03 DIAGNOSIS — Z8616 Personal history of COVID-19: Secondary | ICD-10-CM | POA: Diagnosis not present

## 2024-05-03 DIAGNOSIS — Z881 Allergy status to other antibiotic agents status: Secondary | ICD-10-CM | POA: Diagnosis not present

## 2024-05-03 DIAGNOSIS — Z803 Family history of malignant neoplasm of breast: Secondary | ICD-10-CM | POA: Diagnosis not present

## 2024-05-03 DIAGNOSIS — Z6841 Body Mass Index (BMI) 40.0 and over, adult: Secondary | ICD-10-CM | POA: Diagnosis not present

## 2024-05-03 DIAGNOSIS — I10 Essential (primary) hypertension: Secondary | ICD-10-CM | POA: Diagnosis not present

## 2024-05-03 DIAGNOSIS — Z833 Family history of diabetes mellitus: Secondary | ICD-10-CM | POA: Diagnosis not present

## 2024-05-03 LAB — RAD ONC ARIA SESSION SUMMARY
Course Elapsed Days: 29
Plan Fractions Treated to Date: 5
Plan Prescribed Dose Per Fraction: 2 Gy
Plan Total Fractions Prescribed: 5
Plan Total Prescribed Dose: 10 Gy
Reference Point Dosage Given to Date: 10 Gy
Reference Point Session Dosage Given: 2 Gy
Session Number: 21

## 2024-05-06 ENCOUNTER — Encounter: Payer: Self-pay | Admitting: *Deleted

## 2024-05-07 NOTE — Radiation Completion Notes (Signed)
 Patient Name: Amy Watson, Amy Watson MRN: [id number] Date of Birth: 20-May-1971 Referring Physician: [name], M.D. Date of Service: 2024-05-07 Radiation Oncologist: Marcey Penton, M.D. [facility]                             RADIATION ONCOLOGY END OF TREATMENT NOTE     Diagnosis: D05.12 Intraductal carcinoma in situ of left breast Staging on 2024-02-19: Ductal carcinoma in situ (DCIS) of left breast T=cTis (DCIS), N=cN0, M=cM0 Intent: Curative     HPI: Patient is a 53 year old female who presents with an abnormal mammogram of her left breast.  There was an 8 mm group of coarse heterogeneous calcifications in the upper outer quadrant with recommendation for stereotactic guided biopsy.  This was positive for intermediate grade DCIS ER positive.  She went on to have a wide local excision again showing intermediate grade ductal carcinoma in situ measuring 1.5 mm.  There was lobular carcinoma in situ present.  Margins were clear at 6 mm.  She has tolerated her surgery well and is without complaint.  She has been seen by medical oncology with recommendation for endocrine therapy after completion of radiation.  She specifically denies breast tenderness cough or bone pain.      ==========DELIVERED PLANS==========  First Treatment Date: 2024-04-04 Last Treatment Date: 2024-05-03   Plan Name: Breast_L_BH Site: Breast, Left Technique: 3D Mode: Photon Dose Per Fraction: 2.66 Gy Prescribed Dose (Delivered / Prescribed): 42.56 Gy / 42.56 Gy Prescribed Fxs (Delivered / Prescribed): 16 / 16   Plan Name: Breast_L_Bst Site: Breast, Left Technique: 3D Mode: Photon Dose Per Fraction: 2 Gy Prescribed Dose (Delivered / Prescribed): 10 Gy / 10 Gy Prescribed Fxs (Delivered / Prescribed): 5 / 5     ==========ON TREATMENT VISIT DATES========== 2024-04-09, 2024-04-16, 2024-04-23, 2024-04-30     ==========UPCOMING VISITS========== 06/27/2024 [facility] OFFICE  VISIT [place] Clap, MD  06/10/2024 [facility] RAD ONCOLOGY FOLLOW UP 30 Penton Marcey, MD  06/07/2024 [facility] [place] ECHOCARDIOGRAM [facility] US  1  05/30/2024 [facility] HTN Raford Riggs, MD  05/16/2024 [facility] MED ONC EST PT CAP ZELPHIA, MD        ==========APPENDIX - ON TREATMENT VISIT NOTES==========   See weekly On Treatment Notes in Epic for details in the Media tab (listed as Progress notes on the On Treatment Visit Dates listed above).

## 2024-05-16 ENCOUNTER — Inpatient Hospital Stay: Attending: Oncology | Admitting: Oncology

## 2024-05-16 ENCOUNTER — Encounter: Payer: Self-pay | Admitting: *Deleted

## 2024-05-16 ENCOUNTER — Encounter: Payer: Self-pay | Admitting: Oncology

## 2024-05-16 VITALS — BP 138/64 | HR 85 | Temp 97.8°F | Resp 19 | Ht 66.0 in | Wt 260.4 lb

## 2024-05-16 DIAGNOSIS — Z803 Family history of malignant neoplasm of breast: Secondary | ICD-10-CM | POA: Diagnosis not present

## 2024-05-16 DIAGNOSIS — Y842 Radiological procedure and radiotherapy as the cause of abnormal reaction of the patient, or of later complication, without mention of misadventure at the time of the procedure: Secondary | ICD-10-CM | POA: Insufficient documentation

## 2024-05-16 DIAGNOSIS — I10 Essential (primary) hypertension: Secondary | ICD-10-CM | POA: Insufficient documentation

## 2024-05-16 DIAGNOSIS — Z823 Family history of stroke: Secondary | ICD-10-CM | POA: Insufficient documentation

## 2024-05-16 DIAGNOSIS — Z809 Family history of malignant neoplasm, unspecified: Secondary | ICD-10-CM

## 2024-05-16 DIAGNOSIS — Z79899 Other long term (current) drug therapy: Secondary | ICD-10-CM | POA: Diagnosis not present

## 2024-05-16 DIAGNOSIS — N6082 Other benign mammary dysplasias of left breast: Secondary | ICD-10-CM | POA: Insufficient documentation

## 2024-05-16 DIAGNOSIS — G4730 Sleep apnea, unspecified: Secondary | ICD-10-CM | POA: Diagnosis not present

## 2024-05-16 DIAGNOSIS — L589 Radiodermatitis, unspecified: Secondary | ICD-10-CM | POA: Insufficient documentation

## 2024-05-16 DIAGNOSIS — E669 Obesity, unspecified: Secondary | ICD-10-CM | POA: Diagnosis not present

## 2024-05-16 DIAGNOSIS — D0512 Intraductal carcinoma in situ of left breast: Secondary | ICD-10-CM | POA: Insufficient documentation

## 2024-05-16 DIAGNOSIS — Z780 Asymptomatic menopausal state: Secondary | ICD-10-CM | POA: Diagnosis not present

## 2024-05-16 DIAGNOSIS — Z8616 Personal history of COVID-19: Secondary | ICD-10-CM | POA: Insufficient documentation

## 2024-05-16 DIAGNOSIS — L905 Scar conditions and fibrosis of skin: Secondary | ICD-10-CM | POA: Insufficient documentation

## 2024-05-16 DIAGNOSIS — M1990 Unspecified osteoarthritis, unspecified site: Secondary | ICD-10-CM | POA: Diagnosis not present

## 2024-05-16 DIAGNOSIS — Z833 Family history of diabetes mellitus: Secondary | ICD-10-CM | POA: Diagnosis not present

## 2024-05-16 DIAGNOSIS — Z9049 Acquired absence of other specified parts of digestive tract: Secondary | ICD-10-CM | POA: Insufficient documentation

## 2024-05-16 DIAGNOSIS — Z808 Family history of malignant neoplasm of other organs or systems: Secondary | ICD-10-CM | POA: Insufficient documentation

## 2024-05-16 DIAGNOSIS — Z881 Allergy status to other antibiotic agents status: Secondary | ICD-10-CM | POA: Insufficient documentation

## 2024-05-16 DIAGNOSIS — L598 Other specified disorders of the skin and subcutaneous tissue related to radiation: Secondary | ICD-10-CM | POA: Insufficient documentation

## 2024-05-16 DIAGNOSIS — Z79811 Long term (current) use of aromatase inhibitors: Secondary | ICD-10-CM | POA: Insufficient documentation

## 2024-05-16 MED ORDER — ANASTROZOLE 1 MG PO TABS: 1.0000 mg | ORAL_TABLET | Freq: Every day | ORAL | 3 refills | Status: DC

## 2024-05-16 NOTE — Assessment & Plan Note (Signed)
 Recommend calcium and vitamin D supplementation.  Obtain DEXA

## 2024-05-16 NOTE — Progress Notes (Signed)
 Patient is still having some pain in her left breast but does not have any new or acute concerns at this time.

## 2024-05-16 NOTE — Progress Notes (Addendum)
 Hematology/Oncology Progress note [phone] [phone]        REFERRING PROVIDER: Corwin Antu, FNP    CHIEF COMPLAINTS/PURPOSE OF CONSULT Left breast DCIS  ASSESSMENT & PLAN:   Ductal carcinoma in situ (DCIS) of left breast Left DCIS, ER positive.  Status post left breast lumpectomy.  Pathology showed DCIS with LCIS, radial scar pTis  Status post adjuvant radiation. Recommend adjuvant endocrine therapy.  She is postmenopausal, recommend aromatase inhibitor Arimidex 1 mg daily, plan 5 years..  potential side effects reviewed and discussed patient.   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  Patient has had a discussion with genetic counselor.  Aromatase inhibitor use Recommend calcium  and vitamin D  supplementation. Obtain DEXA  Radiation dermatitis Expect to improve over time.  Reassurance was provided.    Orders Placed This Encounter  Procedures   DG Bone Density    Standing Status:   Future    Expected Date:   05/23/2024    Expiration Date:   05/16/2025    Reason for Exam (SYMPTOM  OR DIAGNOSIS REQUIRED):   breast cancer    Is patient pregnant?:   No    Preferred imaging location?:   [facility]   CBC with Differential (Cancer Center Only)    Standing Status:   Future    Expected Date:   08/16/2024    Expiration Date:   11/14/2024   CMP (Cancer Center only)    Standing Status:   Future    Expected Date:   08/16/2024    Expiration Date:   11/14/2024   Follicle stimulating hormone    Standing Status:   Future    Expected Date:   08/16/2024    Expiration Date:   11/14/2024   Estradiol     Standing Status:   Future    Expected Date:   08/16/2024    Expiration Date:   11/14/2024   Follow-up 3 months All questions were answered. The patient knows to call the clinic with any problems, questions or concerns.  [name], MD, PhD [facility] Hematology Oncology 05/16/2024    HISTORY OF PRESENTING ILLNESS:  Amy Watson 53  y.o. female presents to establish care for left breast DCIS I have reviewed her chart and materials related to her cancer extensively and collaborated history with the patient. Summary of oncologic history is as follows: Oncology History  Ductal carcinoma in situ (DCIS) of left breast  02/01/2024 Mammogram   Bilateral screening mammogram showed left breast calcifications warrant further evaluation.   02/08/2024 Imaging   LEFT breast 8 mm group of coarse heterogeneous calcification in the upper-outer quadrant is indeterminate. Recommend further assessment with stereotactic guided biopsy.   02/19/2024 Initial Diagnosis   Ductal carcinoma in situ (DCIS) of left breast  Patient underwent left breast needle core biopsy of upper outer posterior depth calcifications.  Pathology showed DCIS, intermediate to high-grade with necrosis and calcifications.  Columnar cell change and apocrine metaplasia.  Menarche at age of 55 or 47 First live birth at age of 88 OCP use: >5 years,  Menopausal status: LMP over 1 year ago History of HRT use: No History of chest radiation: No Number of previous breast biopsies:  No Family history is positive for breast cancer.   02/19/2024 Cancer Staging   Staging form: Breast, AJCC 8th Edition - Clinical stage from 02/19/2024: Stage 0 (cTis (DCIS), cN0, cM0, ER+, PR: Not Assessed, HER2: Not Assessed) - Signed by Cap Zelphia, MD on 02/19/2024 Stage prefix:  Initial diagnosis Nuclear grade: G2    Genetic Testing   No pathogenic variants identified on the [employer] BRCAPlus + CancerNext+RNA panel. 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 The report date is 02/27/2024.  The [employer] Panel includes sequencing, rearrangement analysis, and RNA analysis for the following 13 genes: ATM, BARD1, BRCA1, BRCA2, CDH1, CHEK2, NF1, PALB2, PTEN, RAD51C, RAD51D, STK11 and TP53 (sequencing and deletion/duplication).  The [employer] CancerNext+RNAinsight Panel includes sequencing, rearrangement  analysis, and RNA analysis for the following 40 genes: APC, ATM, BAP1, BARD1, BMPR1A, BRCA1, BRCA2, BRIP1, CDH1, CDKN2A, CHEK2, FH, FLCN, MET, MLH1, MSH2, MSH6, MUTYH, NF1, NTHL1, PALB2, PMS2, PTEN, RAD51C, RAD51D, RPS20, SMAD4, STK11, TP53, TSC1, TSC2, and VHL (sequencing and deletion/duplication); AXIN2, HOXB13, MBD4, MSH3, POLD1 and POLE (sequencing only); EPCAM and GREM1 (deletion/duplication only).   02/29/2024 Surgery   Patient status post left lumpectomy  1. Breast, lumpectomy, left :      - DUCTAL CARCINOMA IN SITU, SOLID AND CRIBRIFORM TYPES, INTERMEDIATE NUCLEAR      GRADE, WITH NECROSIS.      - DCIS GREATEST DIMENSION: 1.5 MM      - MARGINS: NEGATIVE      - CLOSEST MARGIN: LATERAL, 6 MM      - PROGNOSTIC MARKERS (PERFORMED ON PRIOR BIOPSY): ER POSITIVE (95%)      - SAVI SCOUT PRESENT.      - OTHER FINDINGS: LOBULAR CARCINOMA IN SITU (CLASSIC TYPE), RADIAL SCAR, USUAL      DUCTAL HYPERPLASIA, COLUMNAR CELL CHANGE, FAT NECROSIS      - SEE ONCOLOGY TABLE.       2. Breast, lumpectomy, left, new medial margin :      - BENIGN BREAST TISSUE WITH BIOPSY SITE AND CLIP PRESENT (X).      - BIOPSY SITE EXTENDS TO THE MEDIAL AND LATERAL MARGINS.      - NEGATIVE FOR ATYPIA OR MALIGNANCY.  1. CASE SUMMARY: DUCTAL CARCINOMA IN SITU OF THE BREAST Standard(s): AJCC-UICC 8 SPECIMEN Procedure: Excision Specimen Laterality: Left TUMOR Histologic Type: Ductal carcinoma in situ Size (Extent) of DCIS: Estimated size (extent) of DCIS is at least 1.5 mm Nuclear Grade: 2 Necrosis: Present, central (expansive comedo necrosis) MARGINS Margin Status: All margins negative for DCIS Distance from DCIS to closest margin: 6 mm Closest margin to DCIS: Lateral REGIONAL LYMPH NODES Regional Lymph node Status: Not applicable (no regional lymph nodes submitted or found) DISTANT METASTASIS Distant Site(s) Involved, if applicable (select all that apply): Not applicable PATHOLOGIC STAGE CLASSIFICATION (pTNM,  AJCC 8th Edition) TNM Descriptors: Not applicable pTis (ductal carcinoma in situ) Regional Lymph Nodes Modifier: Not applicable pN not assigned (no nodes submitted or found) pM - Not applicable SPECIAL STUDIES Breast Biomarker Testing Performed on Previous Biopsy: [id number] - Estrogen Receptor (ER): Positive, 95%      Today patient presents to discuss pathology results  Patient reports feeling well.  No new complaints.  She has finished adjuvant radiation.  Presented discussed about endocrine therapy.    MEDICAL HISTORY:  Past Medical History:  Diagnosis Date   Allergy    Anxiety    Arthritis    Asthma    Cancer (HCC) 01/2024   DCIS.left breast   COVID-19    Depression    Dyspnea    Gallbladder sludge 2025   Hypertension    Obesity    PONV (postoperative nausea and vomiting)    slow to wake up after gallbladder   Pre-diabetes    Sleep apnea 2025   cpap  SURGICAL HISTORY: Past Surgical History:  Procedure Laterality Date   BREAST BIOPSY Left 02/12/2024   MM LT BREAST BX W LOC DEV 1ST LESION IMAGE BX SPEC STEREO GUIDE 02/12/2024 [facility]   BREAST BIOPSY  02/22/2024   MM LT BREAST SAVI/RF TAG 1ST LESION MAMMO GUIDE 02/22/2024 [facility]   BREAST LUMPECTOMY WITH RADIO FREQUENCY LOCALIZER Left 02/29/2024   Procedure: BREAST LUMPECTOMY WITH RADIO FREQUENCY LOCALIZER;  Surgeon: Desiderio Schanz, MD;  Location: [facility];  Service: General;  Laterality: Left;   CHOLECYSTECTOMY     COLONOSCOPY     DENTAL SURGERY     FOOT SURGERY     right, mortons neuroma rescetion   FOOT SURGERY     right plantar fascia torn   Knee arthoscopy Left 2025   NECK SURGERY     laser surgery    SOCIAL HISTORY: Social History   Socioeconomic History   Marital status: Divorced    Spouse name: Not on file   Number of children: 2   Years of education: some college   Highest education level: Some college, no degree  Occupational History   Not on file  Tobacco Use    Smoking status: Never    Passive exposure: Never   Smokeless tobacco: Never  Vaping Use   Vaping status: Never Used  Substance and Sexual Activity   Alcohol use: Yes    Comment: rarely   Drug use: No   Sexual activity: Not Currently  Other Topics Concern   Not on file  Social History Narrative   05/06/20   From: [place]   Living: oldest daughter Jordis)   Work: Production assistant, radio and [employer]      Family: 2 children - Sharyne and Lauraine      Enjoys: read, yardwork      Exercise: parttime job - steps at work   Diet: working with obesity specialist to try to reduce appetite      Safety   Seat belts: Yes    Guns: Yes  and secure   Safe in relationships: Yes    Social Drivers of Corporate investment banker Strain: Low Risk  (02/19/2024)   Overall Financial Resource Strain (CARDIA)    Difficulty of Paying Living Expenses: Not very hard  Recent Concern: Financial Resource Strain - Medium Risk (02/19/2024)   Overall Financial Resource Strain (CARDIA)    Difficulty of Paying Living Expenses: Somewhat hard  Food Insecurity: No Food Insecurity (02/19/2024)   Hunger Vital Sign    Worried About Running Out of Food in the Last Year: Never true    Ran Out of Food in the Last Year: Never true  Transportation Needs: No Transportation Needs (02/26/2024)   PRAPARE - Administrator, Civil Service (Medical): No    Lack of Transportation (Non-Medical): No  Physical Activity: Unknown (09/13/2023)   Exercise Vital Sign    Days of Exercise per Week: 0 days    Minutes of Exercise per Session: Not on file  Recent Concern: Physical Activity - Inactive (09/13/2023)   Exercise Vital Sign    Days of Exercise per Week: 0 days    Minutes of Exercise per Session: 20 min  Stress: No Stress Concern Present (02/19/2024)   [employer] of Occupational Health - Occupational Stress Questionnaire    Feeling of Stress: Not at all  Social Connections: Unknown (09/13/2023)   Social Connection and  Isolation Panel    Frequency of Communication with Friends and Family: More than three times a  week    Frequency of Social Gatherings with Friends and Family: Twice a week    Attends Religious Services: Patient declined    Database administrator or Organizations: No    Attends Engineer, structural: Not on file    Marital Status: Patient declined  Intimate Partner Violence: Not At Risk (02/19/2024)   Humiliation, Afraid, Rape, and Kick questionnaire    Fear of Current or Ex-Partner: No    Emotionally Abused: No    Physically Abused: No    Sexually Abused: No    FAMILY HISTORY: Family History  Problem Relation Age of Onset   Breast cancer Mother 41   Diabetes Father    Hypertension Father    Stroke Maternal Aunt    Skin cancer Maternal Grandfather     ALLERGIES:  is allergic to prozac [fluoxetine] and azithromycin.  MEDICATIONS:  Current Outpatient Medications  Medication Sig Dispense Refill   anastrozole (ARIMIDEX) 1 MG tablet Take 1 tablet (1 mg total) by mouth daily. 30 tablet 3   acetaminophen  (TYLENOL ) 500 MG tablet Take 2 tablets (1,000 mg total) by mouth every 6 (six) hours as needed for mild pain (pain score 1-3).     amLODipine  (NORVASC ) 10 MG tablet Take 1 tablet (10 mg total) by mouth daily. 90 tablet 3   B Complex Vitamins (B COMPLEX PO) Take 1 tablet by mouth daily.     busPIRone  (BUSPAR ) 10 MG tablet Take 10 mg by mouth daily.     Calcium  Carbonate+Vitamin D  (CALCIUM  600+D3) 600-200 MG-UNIT TABS Take 1 tablet by mouth in the morning and at bedtime. 180 tablet 3   cetirizine (ZYRTEC) 10 MG tablet Take 10 mg by mouth daily as needed for allergies.     escitalopram  (LEXAPRO ) 20 MG tablet TAKE 1 TABLET BY MOUTH EVERY DAY 90 tablet 3   hydrochlorothiazide  (HYDRODIURIL ) 25 MG tablet Take 1 tablet (25 mg total) by mouth daily. 90 tablet 3   ibuprofen  (ADVIL ) 600 MG tablet Take 1 tablet (600 mg total) by mouth every 8 (eight) hours as needed for moderate pain (pain  score 4-6). 60 tablet 1   montelukast (SINGULAIR) 10 MG tablet Take 10 mg by mouth at bedtime as needed (allergies).     PROAIR HFA 108 (90 Base) MCG/ACT inhaler Inhale 1-2 puffs into the lungs every 4 (four) hours as needed for wheezing or shortness of breath.     spironolactone  (ALDACTONE ) 25 MG tablet Take 1 tablet (25 mg total) by mouth daily. 90 tablet 3   tirzepatide  (ZEPBOUND ) 2.5 MG/0.5ML Pen Inject 2.5 mg into the skin once a week for 28 days. (Patient not taking: Reported on 05/16/2024) 2 mL 0   valsartan  (DIOVAN ) 320 MG tablet TAKE 1 TABLET BY MOUTH EVERY DAY 90 tablet 3   No current facility-administered medications for this visit.    Review of Systems  Constitutional:  Negative for appetite change, chills, fatigue and fever.  HENT:   Negative for hearing loss and voice change.   Eyes:  Negative for eye problems.  Respiratory:  Negative for chest tightness and cough.   Cardiovascular:  Negative for chest pain.  Gastrointestinal:  Negative for abdominal distention, abdominal pain and blood in stool.  Endocrine: Negative for hot flashes.  Genitourinary:  Negative for difficulty urinating and frequency.   Musculoskeletal:  Negative for arthralgias.  Skin:  Negative for itching and rash.  Neurological:  Negative for extremity weakness.  Hematological:  Negative for adenopathy.  Psychiatric/Behavioral:  Negative for confusion.     PHYSICAL EXAMINATION: ECOG PERFORMANCE STATUS: 0 - Asymptomatic  Vitals:   05/16/24 1025  BP: 138/64  Pulse: 85  Resp: 19  Temp: 97.8 F (36.6 C)  SpO2: 97%   Filed Weights   05/16/24 1025  Weight: 260 lb 6.4 oz (118.1 kg)    Physical Exam Constitutional:      General: She is not in acute distress.    Appearance: She is not diaphoretic.  HENT:     Head: Normocephalic and atraumatic.  Eyes:     General: No scleral icterus. Cardiovascular:     Rate and Rhythm: Normal rate and regular rhythm.     Heart sounds: No murmur  heard. Pulmonary:     Effort: Pulmonary effort is normal. No respiratory distress.  Abdominal:     General: There is no distension.  Musculoskeletal:        General: Normal range of motion.     Cervical back: Normal range of motion and neck supple.  Skin:    Findings: No erythema.  Neurological:     Mental Status: She is alert and oriented to person, place, and time. Mental status is at baseline.     Motor: No abnormal muscle tone.  Psychiatric:        Mood and Affect: Mood and affect normal.       LABORATORY DATA:  I have reviewed the data as listed    Latest Ref Rng & Units 04/24/2024    1:01 PM 04/10/2024    1:07 PM 02/20/2024    8:16 AM  CBC  WBC 4.0 - 10.5 K/uL 7.9  8.9  6.3   Hemoglobin 12.0 - 15.0 g/dL 85.9  85.8  85.9   Hematocrit 36.0 - 46.0 % 40.3  40.3  40.0   Platelets 150 - 400 K/uL 275  268  268       Latest Ref Rng & Units 03/11/2024    7:39 AM 02/20/2024    8:17 AM 11/14/2023   11:27 AM  CMP  Glucose 70 - 99 mg/dL 875  99  884   BUN 6 - 24 mg/dL 13  14  10    Creatinine 0.57 - 1.00 mg/dL 9.49  9.31  9.46   Sodium 134 - 144 mmol/L 142  138  139   Potassium 3.5 - 5.2 mmol/L 3.9  4.2  3.7   Chloride 96 - 106 mmol/L 107  107  102   CO2 20 - 29 mmol/L 17  23  22    Calcium  8.7 - 10.2 mg/dL 8.7  8.8  9.2   Total Protein 6.5 - 8.1 g/dL  7.2    Total Bilirubin 0.0 - 1.2 mg/dL  0.7    Alkaline Phos 38 - 126 U/L  74    AST 15 - 41 U/L  31    ALT 0 - 44 U/L  38       RADIOGRAPHIC STUDIES: I have personally reviewed the radiological images as listed and agreed with the findings in the report. No results found.

## 2024-05-16 NOTE — Assessment & Plan Note (Signed)
 Expect to improve over time.  Reassurance was provided.

## 2024-05-16 NOTE — Assessment & Plan Note (Signed)
 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  Patient has had a discussion with genetic counselor.

## 2024-05-16 NOTE — Assessment & Plan Note (Addendum)
 Left DCIS, ER positive.  Status post left breast lumpectomy.  Pathology showed DCIS with LCIS, radial scar pTis  Status post adjuvant radiation. Recommend adjuvant endocrine therapy.  She is postmenopausal, recommend aromatase inhibitor Arimidex 1 mg daily, plan 5 years..  potential side effects reviewed and discussed patient.

## 2024-05-23 DIAGNOSIS — I4729 Other ventricular tachycardia: Secondary | ICD-10-CM | POA: Diagnosis not present

## 2024-05-23 DIAGNOSIS — I493 Ventricular premature depolarization: Secondary | ICD-10-CM | POA: Diagnosis not present

## 2024-05-24 ENCOUNTER — Telehealth: Payer: Self-pay | Admitting: *Deleted

## 2024-05-24 DIAGNOSIS — S92353A Displaced fracture of fifth metatarsal bone, unspecified foot, initial encounter for closed fracture: Secondary | ICD-10-CM | POA: Insufficient documentation

## 2024-05-24 NOTE — Telephone Encounter (Signed)
 She said she was post to get a bone density but no one has reached out to her when she can have it and where it at.  I told the patient on the voicemail because she did not answer and told her that she could get it set up with [facility] the place where you get the breast checked and the telephone is [phone].  You can set it up with if they have spots and  October, November, or December.  Any of them's if they have spots for that can go ahead and get it done and then when you see Dr. Babara in August 16, 2024 she would have the results for you.  If she has any questions you can came call back at [phone]

## 2024-05-27 ENCOUNTER — Ambulatory Visit: Payer: Self-pay

## 2024-05-27 DIAGNOSIS — I4729 Other ventricular tachycardia: Secondary | ICD-10-CM

## 2024-05-27 DIAGNOSIS — I493 Ventricular premature depolarization: Secondary | ICD-10-CM | POA: Diagnosis not present

## 2024-05-30 ENCOUNTER — Encounter (HOSPITAL_BASED_OUTPATIENT_CLINIC_OR_DEPARTMENT_OTHER): Admitting: Cardiovascular Disease

## 2024-06-06 ENCOUNTER — Ambulatory Visit
Admission: RE | Admit: 2024-06-06 | Discharge: 2024-06-06 | Disposition: A | Discharge status: Home / Self Care | Source: Ambulatory Visit | Attending: Oncology | Admitting: Oncology

## 2024-06-06 DIAGNOSIS — D0512 Intraductal carcinoma in situ of left breast: Secondary | ICD-10-CM | POA: Diagnosis not present

## 2024-06-06 DIAGNOSIS — Z780 Asymptomatic menopausal state: Secondary | ICD-10-CM | POA: Diagnosis not present

## 2024-06-07 ENCOUNTER — Ambulatory Visit

## 2024-06-07 DIAGNOSIS — I4729 Other ventricular tachycardia: Secondary | ICD-10-CM | POA: Diagnosis not present

## 2024-06-07 DIAGNOSIS — I493 Ventricular premature depolarization: Secondary | ICD-10-CM | POA: Diagnosis not present

## 2024-06-07 LAB — ECHOCARDIOGRAM COMPLETE
AR max vel: 2.72 cm2
AV Area VTI: 2.82 cm2
AV Area mean vel: 2.71 cm2
AV Mean grad: 2 mmHg
AV Peak grad: 3.2 mmHg
Ao pk vel: 0.9 m/s
Area-P 1/2: 5.13 cm2
S' Lateral: 3.6 cm

## 2024-06-10 ENCOUNTER — Encounter: Payer: Self-pay | Admitting: Radiation Oncology

## 2024-06-10 ENCOUNTER — Ambulatory Visit
Admission: RE | Admit: 2024-06-10 | Discharge: 2024-06-10 | Disposition: A | Discharge status: Home / Self Care | Source: Ambulatory Visit | Attending: Radiation Oncology | Admitting: Radiation Oncology

## 2024-06-10 ENCOUNTER — Ambulatory Visit: Payer: Self-pay | Admitting: Oncology

## 2024-06-10 ENCOUNTER — Other Ambulatory Visit: Payer: Self-pay | Admitting: *Deleted

## 2024-06-10 ENCOUNTER — Ambulatory Visit: Payer: Self-pay

## 2024-06-10 ENCOUNTER — Ambulatory Visit
Admission: RE | Admit: 2024-06-10 | Discharge: 2024-06-10 | Disposition: A | Discharge status: Home / Self Care | Payer: Self-pay | Source: Ambulatory Visit

## 2024-06-10 VITALS — BP 136/84 | HR 93 | Temp 98.6°F | Resp 20 | Wt 251.0 lb

## 2024-06-10 DIAGNOSIS — C50911 Malignant neoplasm of unspecified site of right female breast: Secondary | ICD-10-CM

## 2024-06-10 DIAGNOSIS — I5022 Chronic systolic (congestive) heart failure: Secondary | ICD-10-CM

## 2024-06-10 DIAGNOSIS — E7800 Pure hypercholesterolemia, unspecified: Secondary | ICD-10-CM

## 2024-06-10 NOTE — Progress Notes (Signed)
 Radiation Oncology Follow up Note  Name: Amy Watson   Date:   06/10/2024 MRN:  [id number] DOB: 02/09/1971    This 53 y.o. female presents to the clinic today for 1 month follow-up status post whole breast radiation to her left breast for ER-positive ductal carcinoma in situ.  REFERRING PROVIDER: Corwin Antu, FNP  HPI: Patient is a 53 year old female now out 1 month having completed whole breast radiation to her left breast for ER-positive ductal carcinoma in situ.  Seen today in routine follow-up she is doing well.  She specifically denies breast tenderness cough or bone pain she is doing dealing with other issues including fracture of her foot..  She has been started on Arimidex although has not picked up that prescription yet.  COMPLICATIONS OF TREATMENT: none  FOLLOW UP COMPLIANCE: keeps appointments   PHYSICAL EXAM:  BP 136/84   Pulse 93   Temp 98.6 F (37 C) (Tympanic)   Resp 20   Wt 251 lb (113.9 kg) Comment: stated wt  LMP 08/15/2021   BMI 40.51 kg/m  Lungs are clear to A&P cardiac examination essentially unremarkable with regular rate and rhythm. No dominant mass or nodularity is noted in either breast in 2 positions examined. Incision is well-healed. No axillary or supraclavicular adenopathy is appreciated. Cosmetic result is excellent.  Well-developed well-nourished patient in NAD. HEENT reveals PERLA, EOMI, discs not visualized.  Oral cavity is clear. No oral mucosal lesions are identified. Neck is clear without evidence of cervical or supraclavicular adenopathy. Lungs are clear to A&P. Cardiac examination is essentially unremarkable with regular rate and rhythm without murmur rub or thrill. Abdomen is benign with no organomegaly or masses noted. Motor sensory and DTR levels are equal and symmetric in the upper and lower extremities. Cranial nerves II through XII are grossly intact. Proprioception is intact. No peripheral adenopathy or edema is identified. No motor or  sensory levels are noted. Crude visual fields are within normal range.  RADIOLOGY RESULTS: No current films for review  PLAN: The present time patient is doing well 1 month out from whole breast radiation and pleased with her overall progress.  She will start taking Arimidex when she has her prescription filled.  I have asked to see her back in 6 months for follow-up.  Patient knows to call with any concerns.  I would like to take this opportunity to thank you for allowing me to participate in the care of your patient.SABRA Marcey Penton, MD

## 2024-06-24 DIAGNOSIS — S92354A Nondisplaced fracture of fifth metatarsal bone, right foot, initial encounter for closed fracture: Secondary | ICD-10-CM | POA: Diagnosis not present

## 2024-06-26 NOTE — Progress Notes (Signed)
 Cardiology Office Note   Date:  06/27/2024  ID:  Amy Watson, DOB Dec 16, 1970, MRN [id number] PCP: Corwin Antu, FNP  [facility] Providers Cardiologist:  Caron Poser, MD     History of Present Illness Amy Watson is a 53 y.o. female PMH HFmrEF, resistant HTN, OSA, breast cancer s/p lumpectomy 02/2024 currently undergoing radiotherapy who presents for to establish care.  Patient reports she does overall been feeling unwell recently.  Intermittent dyspnea and fatigue.  She recently had a lumpectomy for breast cancer and is undergoing radiotherapy.  Denies orthopnea or PND.  Denies significant LE edema.  Occasional palpitations.  No chest discomfort.  Last LDL 86 08/2023.  Interval history: Patient is overall feeling somewhat better since last visit.  We did some cardiac testing including a monitor, echo, and CAC score, all of which came back normal and reassuring.  Her blood pressures have been well-controlled, though still on a significant amount of medications.  We tried to send in Rx for Zepbound  but this was denied by her insurance company.  Relevant CVD History -CAC score of 0 06/11/2024 -TTE 06/07/2024 normal biventricular function without valvular disease -Monitor 05/27/2024 mean heart rate 91, rare ectopy, no arrhythmias aside from a 6 beat episode of NSVT -Monitor 03/2024 mean heart rate 87 bpm, rare PACs/PVCs, 3 episodes NSVT (longest 7 beats) -High renin activity, normal aldosterone with normal ratio 02/2024 - Normal renal artery duplex 10/2023 - Mildly reduced LVEF 45% with mild MR and mild TR TTE 06/2018 ([facility])   ROS: Pt denies any chest discomfort, jaw pain, arm pain, palpitations, syncope, presyncope, orthopnea, PND, or LE edema.  Studies Reviewed I have independently reviewed the patient's ECG, previous cardiovascular testing, recent blood work, and prior medical records.  Physical Exam VS:  BP 128/80 (BP Location: Left Arm, Patient Position:  Sitting, Cuff Size: Normal)   Pulse 93   Ht 5' 6 (1.676 m)   Wt 256 lb 3.2 oz (116.2 kg)   LMP 08/15/2021   SpO2 97%   BMI 41.35 kg/m        Wt Readings from Last 3 Encounters:  06/27/24 256 lb 3.2 oz (116.2 kg)  06/10/24 251 lb (113.9 kg)  05/16/24 260 lb 6.4 oz (118.1 kg)    GEN: No acute distress. NECK: No JVD; No carotid bruits. CARDIAC: Regular rate, frequent premature beats, no murmurs, rubs, gallops. RESPIRATORY:  Clear to auscultation. EXTREMITIES:  Warm and well-perfused. No edema.  ASSESSMENT AND PLAN NSVT Frequent PVCs Seen on previous monitor, this has now resolved based on repeat assessment.  Plan: - Continue to monitor for symptoms  Heart failure with recovered ejection fraction Previously reported LVEF 45% from an echo done at [facility] in 2019.  Already taking ARB and Aldactone .  She does not have obvious heart failure symptoms currently.  We obtained a repeat echocardiogram which showed normal biventricular function without valvular disease.  Somewhat unclear if her ejection fraction improved based on ARB/MRA therapy or exactly what happened, but she is NYHA I.  Will continue current therapy for now.  She has a recent CAC score that was 0.  Overall low risk for obstructive CAD.  Plan: - Continue valsartan  320 mg daily, continue spironolactone  25 mg daily - No further evaluation or treatment needed at this time; we can revisit should she develop progressive symptoms  Resistant HTN Morbid obesity OSA Well-controlled today, though on 4 medications.  Secondary hypertension workup has been negative with unremarkable renin-aldosterone testing and renal  artery duplex.  Likely being driven by metabolic syndrome and OSA.  She would be a good candidate for GLP-1 agonist.  Plan: - We tried to start Zepbound  at last visit, though insurance refused to pay for this.  We will set her up with our pharmacist to review options and see if we can find something  affordable for - Continue CPAP - Continue Norvasc  10 mg daily, HCTZ 25 mg daily, valsartan  320 mg daily, and spironolactone  25 mg daily - To be completely thorough, we will also send for plasma metanephrines since at 1 point in time she did report paroxysms of hypertension, flushing, headaches, and palpitations, though these seem to have resolved as of recently        Dispo: RTC 6 months or sooner as needed  Signed, Caron Poser, MD

## 2024-06-27 ENCOUNTER — Ambulatory Visit

## 2024-06-27 VITALS — BP 128/80 | HR 93 | Ht 66.0 in | Wt 256.2 lb

## 2024-06-27 DIAGNOSIS — I5020 Unspecified systolic (congestive) heart failure: Secondary | ICD-10-CM | POA: Diagnosis not present

## 2024-06-27 DIAGNOSIS — I4729 Other ventricular tachycardia: Secondary | ICD-10-CM

## 2024-06-27 DIAGNOSIS — I1A0 Resistant hypertension: Secondary | ICD-10-CM | POA: Diagnosis not present

## 2024-06-27 DIAGNOSIS — I493 Ventricular premature depolarization: Secondary | ICD-10-CM

## 2024-06-27 MED ORDER — HYDROCHLOROTHIAZIDE 25 MG PO TABS: 25.0000 mg | ORAL_TABLET | Freq: Every day | ORAL | 3 refills | Status: AC

## 2024-06-27 NOTE — Patient Instructions (Signed)
 Medication Instructions:  Your physician recommends that you continue on your current medications as directed. Please refer to the Current Medication list given to you today.   *If you need a refill on your cardiac medications before your next appointment, please call your pharmacy*  Lab Work: Your provider would like for you to have following labs drawn today Plasma Metanephrines.   If you have labs (blood work) drawn today and your tests are completely normal, you will receive your results only by: MyChart Message (if you have MyChart) OR A paper copy in the mail If you have any lab test that is abnormal or we need to change your treatment, we will call you to review the results.  Testing/Procedures:  No test ordered today   Follow-Up:  PHARM-D appointment referral for GLP1 medications   At [facility], you and your health needs are our priority.  As part of our continuing mission to provide you with exceptional heart care, our providers are all part of one team.  This team includes your primary Cardiologist (physician) and Advanced Practice Providers or APPs (Physician Assistants and Nurse Practitioners) who all work together to provide you with the care you need, when you need it.  Your next appointment:  6 month(s)  Provider:  Caron Poser, MD    We recommend signing up for the patient portal called MyChart.  Sign up information is provided on this After Visit Summary.  MyChart is used to connect with patients for Virtual Visits (Telemedicine).  Patients are able to view lab/test results, encounter notes, upcoming appointments, etc.  Non-urgent messages can be sent to your provider as well.   To learn more about what you can do with MyChart, go to [email or web address].

## 2024-07-04 ENCOUNTER — Ambulatory Visit: Payer: Self-pay

## 2024-07-04 LAB — METANEPHRINES, PLASMA
Metanephrine, Free: <25 pg/mL (ref 0.0–88.0)
Normetanephrine, Free: 99.4 pg/mL (ref 0.0–244.0)

## 2024-07-05 ENCOUNTER — Ambulatory Visit: Admitting: Family

## 2024-07-05 VITALS — BP 130/82 | HR 76 | Temp 97.9°F | Wt 260.0 lb

## 2024-07-05 DIAGNOSIS — J0100 Acute maxillary sinusitis, unspecified: Secondary | ICD-10-CM

## 2024-07-05 MED ORDER — AMOXICILLIN-POT CLAVULANATE 875-125 MG PO TABS: 1.0000 | ORAL_TABLET | Freq: Two times a day (BID) | ORAL | 0 refills | Status: DC

## 2024-07-05 NOTE — Progress Notes (Signed)
 Established Patient Office Visit  Subjective:      CC:  Chief Complaint  Patient presents with   Acute Visit    Sinus congestion/pressure, post nasal drip x4 weeks. Denies chills, fever, cough, sore throat.    HPI: Amy Watson is a 53 y.o. female presenting on 07/05/2024 for Acute Visit (Sinus congestion/pressure, post nasal drip x4 weeks. Denies chills, fever, cough, sore throat.) .  Discussed the use of AI scribe software for clinical note transcription with the patient, who gave verbal consent to proceed.  History of Present Illness Amy Watson is a 53 year old female who presents with persistent cough and sinus congestion.  She has been experiencing symptoms for the past four weeks, including a persistent cough, which she describes as the most bothersome aspect of her condition. She has tried various over-the-counter medications such as DayQuil, Nyquil, and Mucinex , but Mucinex  makes her feel ill. She also uses a nasal rinse product, Nasofresh, which provides some relief but does not completely alleviate her symptoms.  She describes a sensation of clicking in her ears and a feeling of fullness, though she does not experience ear pain. She reports sinus pressure in her face, particularly around her nose, and a slight sore throat, which she attributes to drainage. No fever or wheezing has been noted.  Her current medication regimen includes Singulair (montelukast) taken every night, along with Zyrtec for allergies. She has adjusted her medication schedule to take all her medications at bedtime instead of at lunch.         Social history:  Relevant past medical, surgical, family and social history reviewed and updated as indicated. Interim medical history since our last visit reviewed.  Allergies and medications reviewed and updated.  DATA REVIEWED: CHART IN EPIC     ROS: Negative unless specifically indicated above in HPI.    Current Outpatient  Medications:    acetaminophen  (TYLENOL ) 500 MG tablet, Take 2 tablets (1,000 mg total) by mouth every 6 (six) hours as needed for mild pain (pain score 1-3)., Disp: , Rfl:    amLODipine  (NORVASC ) 10 MG tablet, Take 1 tablet (10 mg total) by mouth daily., Disp: 90 tablet, Rfl: 3   amoxicillin -clavulanate (AUGMENTIN ) 875-125 MG tablet, Take 1 tablet by mouth 2 (two) times daily., Disp: 20 tablet, Rfl: 0   B Complex Vitamins (B COMPLEX PO), Take 1 tablet by mouth daily., Disp: , Rfl:    busPIRone  (BUSPAR ) 10 MG tablet, Take 10 mg by mouth daily., Disp: , Rfl:    Calcium  Carbonate+Vitamin D  (CALCIUM  600+D3) 600-200 MG-UNIT TABS, Take 1 tablet by mouth in the morning and at bedtime., Disp: 180 tablet, Rfl: 3   cetirizine (ZYRTEC) 10 MG tablet, Take 10 mg by mouth daily as needed for allergies., Disp: , Rfl:    escitalopram  (LEXAPRO ) 20 MG tablet, TAKE 1 TABLET BY MOUTH EVERY DAY, Disp: 90 tablet, Rfl: 3   hydrochlorothiazide  (HYDRODIURIL ) 25 MG tablet, Take 1 tablet (25 mg total) by mouth daily., Disp: 90 tablet, Rfl: 3   ibuprofen  (ADVIL ) 600 MG tablet, Take 1 tablet (600 mg total) by mouth every 8 (eight) hours as needed for moderate pain (pain score 4-6)., Disp: 60 tablet, Rfl: 1   montelukast (SINGULAIR) 10 MG tablet, Take 10 mg by mouth at bedtime as needed (allergies)., Disp: , Rfl:    PROAIR HFA 108 (90 Base) MCG/ACT inhaler, Inhale 1-2 puffs into the lungs every 4 (four) hours as needed for wheezing or shortness of  breath., Disp: , Rfl:    spironolactone  (ALDACTONE ) 25 MG tablet, Take 1 tablet (25 mg total) by mouth daily., Disp: 90 tablet, Rfl: 3   valsartan  (DIOVAN ) 320 MG tablet, TAKE 1 TABLET BY MOUTH EVERY DAY, Disp: 90 tablet, Rfl: 3   anastrozole  (ARIMIDEX ) 1 MG tablet, Take 1 tablet (1 mg total) by mouth daily. (Patient not taking: Reported on 07/05/2024), Disp: 30 tablet, Rfl: 3        Objective:        BP 130/82 (BP Location: Left Arm, Patient Position: Sitting, Cuff Size:  Large)   Pulse 76   Temp 97.9 F (36.6 C) (Oral)   Wt 260 lb (117.9 kg)   LMP 08/15/2021   SpO2 98%   BMI 41.97 kg/m   Physical Exam HEENT: Nose slightly swollen, no significant tenderness. Fluid in ears, no significant pain. Throat slightly red, no significant pain. NECK: Slight tenderness.  Wt Readings from Last 3 Encounters:  07/05/24 260 lb (117.9 kg)  06/27/24 256 lb 3.2 oz (116.2 kg)  06/10/24 251 lb (113.9 kg)    Physical Exam Vitals reviewed.  Constitutional:      General: She is not in acute distress.    Appearance: Normal appearance. She is obese. She is not ill-appearing, toxic-appearing or diaphoretic.  HENT:     Head: Normocephalic.     Right Ear: Tympanic membrane normal.     Left Ear: Tympanic membrane normal.     Nose: Congestion present.     Left Sinus: Maxillary sinus tenderness present.     Mouth/Throat:     Mouth: Mucous membranes are dry.     Pharynx: No oropharyngeal exudate or posterior oropharyngeal erythema.  Eyes:     Extraocular Movements: Extraocular movements intact.     Pupils: Pupils are equal, round, and reactive to light.  Cardiovascular:     Rate and Rhythm: Normal rate and regular rhythm.     Pulses: Normal pulses.     Heart sounds: Normal heart sounds.  Pulmonary:     Effort: Pulmonary effort is normal.     Breath sounds: Normal breath sounds.  Musculoskeletal:     Cervical back: Normal range of motion.  Neurological:     General: No focal deficit present.     Mental Status: She is alert and oriented to person, place, and time. Mental status is at baseline.  Psychiatric:        Mood and Affect: Mood normal.        Behavior: Behavior normal.        Thought Content: Thought content normal.        Judgment: Judgment normal.          Results   Assessment & Plan:   Assessment and Plan Assessment & Plan Acute maxillary sinusitis Symptoms persisting for four weeks, including sinus pressure, congestion, ear clicking,  fullness, sore throat, and cough. No fever reported. Examination reveals mild nasal swelling and fluid in the ears. Differential includes bronchitis, but current symptoms do not align with typical bronchitis presentation. Augmentin  chosen for its efficacy in treating sinusitis and associated bronchial symptoms. - Prescribed Augmentin  for sinusitis and associated bronchial symptoms - Sent prescription to pharmacy rx augmentin  875/125 mg po bid x 10 days          Return if symptoms worsen or fail to improve.     [name], MSN, APRN, FNP-C [facility]

## 2024-07-17 DIAGNOSIS — M25462 Effusion, left knee: Secondary | ICD-10-CM | POA: Insufficient documentation

## 2024-07-17 DIAGNOSIS — S8002XA Contusion of left knee, initial encounter: Secondary | ICD-10-CM | POA: Diagnosis not present

## 2024-07-17 DIAGNOSIS — M25562 Pain in left knee: Secondary | ICD-10-CM | POA: Diagnosis not present

## 2024-07-18 DIAGNOSIS — M25562 Pain in left knee: Secondary | ICD-10-CM | POA: Diagnosis not present

## 2024-07-19 ENCOUNTER — Telehealth: Payer: Self-pay | Admitting: Oncology

## 2024-07-19 NOTE — Telephone Encounter (Signed)
 Pt was scheduled for lab/MD on 08/16/2024. MD will not be here this day. I called and spoke with pt and confirmed new appt date/time. I told her this change will show up in mychart and I could also mail this out to her. Pt declined mail and stated she will see it in [facility].

## 2024-07-23 ENCOUNTER — Encounter: Payer: Self-pay | Admitting: Family

## 2024-07-24 DIAGNOSIS — S92354A Nondisplaced fracture of fifth metatarsal bone, right foot, initial encounter for closed fracture: Secondary | ICD-10-CM | POA: Diagnosis not present

## 2024-07-24 DIAGNOSIS — M7671 Peroneal tendinitis, right leg: Secondary | ICD-10-CM | POA: Diagnosis not present

## 2024-07-29 ENCOUNTER — Ambulatory Visit: Admitting: Surgery

## 2024-07-29 ENCOUNTER — Encounter: Payer: Self-pay | Admitting: Surgery

## 2024-07-29 VITALS — BP 131/76 | HR 86 | Ht 66.0 in | Wt 254.0 lb

## 2024-07-29 DIAGNOSIS — D0512 Intraductal carcinoma in situ of left breast: Secondary | ICD-10-CM | POA: Diagnosis not present

## 2024-07-29 DIAGNOSIS — Z09 Encounter for follow-up examination after completed treatment for conditions other than malignant neoplasm: Secondary | ICD-10-CM

## 2024-07-29 NOTE — Patient Instructions (Addendum)
 Please call our office if you have any questions or concerns   Call Dr Deannie office to see if they can see you in regards to the pain your having under your arm.

## 2024-07-29 NOTE — Progress Notes (Signed)
 07/29/2024  History of Present Illness: Amy Watson is a 53 y.o. female s/p left breast lumpectomy on 02/29/24 for DCIS.  She completed radiation in September and is currently on Arimidex .  She reports that over the past two months she has noticed discomfort in the left chest with different areas of pain around the upper outer portion of the breast, the medial aspect, and the inframammary fold.  She feels pain in the left axilla and feels that perhaps her left arm is a bit more swollen.  She has not brought this up to her other providers.  She is worried that this is related to her lymph nodes.  She points to her left elbow area for possible enlargement and her axilla.  Past Medical History: Past Medical History:  Diagnosis Date   Allergy    Anxiety    Arthritis    Asthma    Many years   Cancer (HCC) 01/2024   DCIS.left breast   COVID-19    Depression    Dyspnea    Gallbladder sludge 2025   Hypertension    Many Years   Obesity    PONV (postoperative nausea and vomiting)    slow to wake up after gallbladder   Pre-diabetes    Sleep apnea 2025   cpap     Past Surgical History: Past Surgical History:  Procedure Laterality Date   BREAST BIOPSY Left 02/12/2024   MM LT BREAST BX W LOC DEV 1ST LESION IMAGE BX SPEC STEREO GUIDE 02/12/2024 [facility]   BREAST BIOPSY  02/22/2024   MM LT BREAST SAVI/RF TAG 1ST LESION MAMMO GUIDE 02/22/2024 [facility]   BREAST LUMPECTOMY WITH RADIO FREQUENCY LOCALIZER Left 02/29/2024   Procedure: BREAST LUMPECTOMY WITH RADIO FREQUENCY LOCALIZER;  Surgeon: Desiderio Schanz, MD;  Location: [facility] ORS;  Service: General;  Laterality: Left;   CHOLECYSTECTOMY     COLONOSCOPY     DENTAL SURGERY     FOOT SURGERY     right, mortons neuroma rescetion   FOOT SURGERY     right plantar fascia torn   Knee arthoscopy Left 2025   NECK SURGERY     laser surgery    Home Medications: Prior to Admission medications  Medication Sig Start Date End Date  Taking? Authorizing Provider  acetaminophen  (TYLENOL ) 500 MG tablet Take 2 tablets (1,000 mg total) by mouth every 6 (six) hours as needed for mild pain (pain score 1-3). 02/29/24   Desiderio Schanz, MD  amLODipine  (NORVASC ) 10 MG tablet Take 1 tablet (10 mg total) by mouth daily. 09/28/23   Dugal, Tabitha, FNP  amoxicillin -clavulanate (AUGMENTIN ) 875-125 MG tablet Take 1 tablet by mouth 2 (two) times daily. 07/05/24   Corwin Antu, FNP  B Complex Vitamins (B COMPLEX PO) Take 1 tablet by mouth daily.    [provider]  busPIRone  (BUSPAR ) 10 MG tablet Take 10 mg by mouth daily.    [provider]  Calcium  Carbonate+Vitamin D  (CALCIUM  600+D3) 600-200 MG-UNIT TABS Take 1 tablet by mouth in the morning and at bedtime. 03/23/21   Velma Raisin, MD  cetirizine (ZYRTEC) 10 MG tablet Take 10 mg by mouth daily as needed for allergies.    [provider]  escitalopram  (LEXAPRO ) 20 MG tablet TAKE 1 TABLET BY MOUTH EVERY DAY 11/30/23   Corwin Antu, FNP  hydrochlorothiazide  (HYDRODIURIL ) 25 MG tablet Take 1 tablet (25 mg total) by mouth daily. 06/27/24 09/25/24  Argentina Clap, MD  ibuprofen  (ADVIL ) 600 MG tablet Take 1 tablet (600 mg  total) by mouth every 8 (eight) hours as needed for moderate pain (pain score 4-6). 02/29/24   Reyana Leisey, Aloysius, MD  montelukast (SINGULAIR) 10 MG tablet Take 10 mg by mouth at bedtime as needed (allergies).    [provider]  PROAIR HFA [phone] Base) MCG/ACT inhaler Inhale 1-2 puffs into the lungs every 4 (four) hours as needed for wheezing or shortness of breath. 11/08/18   [provider]  spironolactone  (ALDACTONE ) 25 MG tablet Take 1 tablet (25 mg total) by mouth daily. 02/20/24   Raford Riggs, MD  valsartan  (DIOVAN ) 320 MG tablet TAKE 1 TABLET BY MOUTH EVERY DAY 11/30/23   Corwin Antu, FNP    Allergies: Allergies[1]  Review of Systems: Review of Systems  Constitutional:  Negative for chills and fever.  Respiratory:  Negative  for shortness of breath.   Cardiovascular:  Negative for chest pain.  Gastrointestinal:  Negative for nausea and vomiting.  Musculoskeletal:        Left chest/breast pain with swelling of the left arm.    Physical Exam BP 131/76   Pulse 86   Ht 5' 6 (1.676 m)   Wt 254 lb (115.2 kg)   LMP 08/15/2021   SpO2 97%   BMI 41.00 kg/m  CONSTITUTIONAL: No acute distress HEENT:  Normocephalic, atraumatic, extraocular motion intact. RESPIRATORY:  Lungs are clear, and breath sounds are equal bilaterally. Normal respiratory effort without pathologic use of accessory muscles. CARDIOVASCULAR: Heart is regular without murmurs, gallops, or rubs. BREAST: Left breast showing skin thickening consistent with radiation changes.  The patient has diffuse tenderness in the breast as well as inframammary fold, medially near the sternum, in the upper outer quadrant towards the pectoralis muscle.  The lateral right breast incision is well-healed without any complications there.  There is no palpable masses.  No left axillary lymphadenopathy.  Right breast without any palpable masses, skin changes, or nipple changes.  No right axillary lymphadenopathy. NEUROLOGIC:  Motor and sensation is grossly normal.  Cranial nerves are grossly intact. PSYCH:  Alert and oriented to person, place and time. Affect is normal.   Assessment and Plan: This is a 53 y.o. female status post left breast lumpectomy for DCIS.  - Discussed with patient the findings on exam showing diffuse tenderness in the left chest in and around the breast.  On exam, I do not think the left arm is particularly more swollen compared to the right.  I do not feel any enlarged lymph nodes in the axilla.  Since we did not do any sentinel lymph node biopsy, it would be unlikely for her to develop lymphedema.  Potentially this may be related to radiation given that this started 2 months ago.  I have asked the patient to reach out to Dr. Glenwood office to see if  she can be seen for further evaluation of this.  If there is no suspicion that this could be related to radiation changes, then she will let us  know so we can order a mammogram and ultrasound of the area. - Otherwise she will follow-up with me in 6 months with repeat mammogram.  I spent 20 minutes dedicated to the care of this patient on the date of this encounter to include pre-visit review of records, face-to-face time with the patient discussing diagnosis and management, and any post-visit coordination of care.   Aloysius Sheree Plant, MD [facility]         [1]  Allergies Allergen Reactions   Prozac [Fluoxetine]  Other (See Comments)    fatigue   Azithromycin Other (See Comments)    C. Diff./  Pain

## 2024-07-30 ENCOUNTER — Encounter: Payer: Self-pay | Admitting: Radiation Oncology

## 2024-07-30 ENCOUNTER — Ambulatory Visit
Admission: RE | Admit: 2024-07-30 | Discharge: 2024-07-30 | Attending: Radiation Oncology | Admitting: Radiation Oncology

## 2024-07-30 ENCOUNTER — Other Ambulatory Visit: Payer: Self-pay | Admitting: *Deleted

## 2024-07-30 VITALS — BP 128/59 | HR 82 | Resp 16 | Ht 66.0 in | Wt 254.0 lb

## 2024-07-30 DIAGNOSIS — D0512 Intraductal carcinoma in situ of left breast: Secondary | ICD-10-CM | POA: Diagnosis not present

## 2024-07-30 DIAGNOSIS — Z170 Estrogen receptor positive status [ER+]: Secondary | ICD-10-CM | POA: Diagnosis not present

## 2024-07-30 DIAGNOSIS — C50911 Malignant neoplasm of unspecified site of right female breast: Secondary | ICD-10-CM

## 2024-07-30 DIAGNOSIS — N644 Mastodynia: Secondary | ICD-10-CM | POA: Diagnosis not present

## 2024-07-30 DIAGNOSIS — Z79811 Long term (current) use of aromatase inhibitors: Secondary | ICD-10-CM | POA: Diagnosis not present

## 2024-07-30 DIAGNOSIS — Z923 Personal history of irradiation: Secondary | ICD-10-CM | POA: Diagnosis not present

## 2024-07-30 NOTE — Progress Notes (Signed)
 Radiation Oncology Follow up Note  Name: Amy Watson   Date:   07/30/2024 MRN:  [id number] DOB: 1970/10/08    This 53 y.o. female presents to the clinic today for self requested follow-up and patient now close to 3 months out status post whole breast radiation to her left breast for ER-positive ductal carcinoma in situ now with breast axillary back and arm pain.  REFERRING PROVIDER: Corwin Antu, FNP  HPI: Patient is a 53 year old female now close to 3 months having completed whole breast external beam radiation therapy to her left breast for ER-positive ductal carcinoma in situ.SABRA  She was doing well although recently saw Dr. Desiderio complaining of discomfort in her left breast pain in her left axilla her left arm which she attributed to possible involvement of her lymph nodes.  She is currently on Arimidex  which she states she is tolerating well.  She also claims there is more warmth in her left breast.  COMPLICATIONS OF TREATMENT: none  FOLLOW UP COMPLIANCE: keeps appointments   PHYSICAL EXAM:  BP (!) 128/59   Pulse 82   Resp 16   Ht 5' 6 (1.676 m)   Wt 254 lb (115.2 kg)   LMP 08/15/2021   BMI 41.00 kg/m I detect no evidence of mastitis in the left breast.  No evidence of lymphedema in her left upper extremity. Lungs are clear to A&P cardiac examination essentially unremarkable with regular rate and rhythm. No dominant mass or nodularity is noted in either breast in 2 positions examined. Incision is well-healed. No axillary or supraclavicular adenopathy is appreciated. Cosmetic result is excellent.  Well-developed well-nourished patient in NAD. HEENT reveals PERLA, EOMI, discs not visualized.  Oral cavity is clear. No oral mucosal lesions are identified. Neck is clear without evidence of cervical or supraclavicular adenopathy. Lungs are clear to A&P. Cardiac examination is essentially unremarkable with regular rate and rhythm without murmur rub or thrill. Abdomen is benign with  no organomegaly or masses noted. Motor sensory and DTR levels are equal and symmetric in the upper and lower extremities. Cranial nerves II through XII are grossly intact. Proprioception is intact. No peripheral adenopathy or edema is identified. No motor or sensory levels are noted. Crude visual fields are within normal range.  RADIOLOGY RESULTS: She had a recent CT scan for cardiac score scoring which I have reviewed as well as her previous CT simulation film.  Both those test show no evidence of axillary adenopathy or any other subtle findings concerning her breast.  PLAN: Patient had no lymph nodes dissected at the time of her lumpectomy for ductal carcinoma in situ.  She also had no axillary radiation as part of her treatment plan with radiation so lymph node and involvement or problems should not be considered reason for any of her symptoms.  I have expressed to her that there is scar tissue from surgery that can be exacerbated over time and is different in every patient.  I have also raise the possibility of Arimidex  causing some increased pain and I have asked her to stop that medication for the next 2 to 3 weeks.  I will also referring her to physical therapy for evaluation and possible therapy.  She is a follow-up with me in April I will see her back at that time.  Patient is close to call sooner if any problems worsen or persist.  I would like to take this opportunity to thank you for allowing me to participate in the care of your  patient.SABRA Amy Penton, MD

## 2024-07-31 DIAGNOSIS — M25571 Pain in right ankle and joints of right foot: Secondary | ICD-10-CM | POA: Diagnosis not present

## 2024-08-02 ENCOUNTER — Other Ambulatory Visit: Payer: Self-pay | Admitting: *Deleted

## 2024-08-02 DIAGNOSIS — C50911 Malignant neoplasm of unspecified site of right female breast: Secondary | ICD-10-CM

## 2024-08-02 DIAGNOSIS — M25571 Pain in right ankle and joints of right foot: Secondary | ICD-10-CM | POA: Diagnosis not present

## 2024-08-07 ENCOUNTER — Encounter: Payer: Self-pay | Admitting: Family

## 2024-08-07 ENCOUNTER — Telehealth: Payer: Self-pay

## 2024-08-07 ENCOUNTER — Ambulatory Visit (INDEPENDENT_AMBULATORY_CARE_PROVIDER_SITE_OTHER): Admitting: Family

## 2024-08-07 VITALS — BP 134/82 | HR 78 | Temp 98.3°F | Ht 66.0 in | Wt 253.0 lb

## 2024-08-07 DIAGNOSIS — M19041 Primary osteoarthritis, right hand: Secondary | ICD-10-CM | POA: Diagnosis not present

## 2024-08-07 DIAGNOSIS — M62830 Muscle spasm of back: Secondary | ICD-10-CM

## 2024-08-07 DIAGNOSIS — G4733 Obstructive sleep apnea (adult) (pediatric): Secondary | ICD-10-CM

## 2024-08-07 DIAGNOSIS — M19042 Primary osteoarthritis, left hand: Secondary | ICD-10-CM

## 2024-08-07 DIAGNOSIS — M1712 Unilateral primary osteoarthritis, left knee: Secondary | ICD-10-CM | POA: Diagnosis not present

## 2024-08-07 MED ORDER — TIZANIDINE HCL 4 MG PO TABS: ORAL_TABLET | ORAL | 0 refills | Status: AC

## 2024-08-07 MED ORDER — DICLOFENAC SODIUM 75 MG PO TBEC: 75.0000 mg | DELAYED_RELEASE_TABLET | Freq: Two times a day (BID) | ORAL | 0 refills | Status: DC

## 2024-08-07 NOTE — Progress Notes (Signed)
 "  Established Patient Office Visit  Subjective:      CC:  Chief Complaint  Patient presents with   Muscle Pain    Left shoulder to side. Radiology thinks it is from treatment. Advised she reach out to pcp for muscle relaxer.      HPI: Amy Watson is a 53 y.o. female presenting on 08/07/2024 for Muscle Pain (Left shoulder to side. Radiology thinks it is from treatment. Advised she reach out to pcp for muscle relaxer.  ) .  Discussed the use of AI scribe software for clinical note transcription with the patient, who gave verbal consent to proceed.  History of Present Illness Amy Watson is a 53 year old female with a history of breast surgery and chemotherapy who presents with left-sided pain and difficulty sleeping.  She experiences left-sided pain described as muscular and pulling. The pain worsens with coughing and lifting her arm, and she has difficulty sleeping due to discomfort when lying on her side.  She uses a CPAP machine for sleep apnea but has issues with the fit and comfort of the mask. Initially, she used a mask that covered her mouth with air holes under her nose, but found it uncomfortable as she does not breathe through her mouth. She tried a nasal mask but found it difficult to breathe with it. She continues to use the original mask despite some discomfort, as it effectively manages her sleep apnea with good numbers reported.  She has a history of knee issues, including a small lateral tear and a crooked patella following an incident where she hit her knee with a car door. An MRI confirmed the tear, and she has been attending physical therapy for perineal tendon issues. She rotates between ibuprofen  and Aleve for pain management but finds them not very effective. She has previously used meloxicam  but does not recall its effectiveness.           Social history:  Relevant past medical, surgical, family and social history reviewed and updated as  indicated. Interim medical history since our last visit reviewed.  Allergies and medications reviewed and updated.  DATA REVIEWED: CHART IN EPIC     ROS: Negative unless specifically indicated above in HPI.   Current Medications[1]        Objective:        BP 134/82   Pulse 78   Temp 98.3 F (36.8 C) (Oral)   Ht 5' 6 (1.676 m)   Wt 253 lb (114.8 kg) Comment: per patient refused to get in office.  LMP 08/15/2021   SpO2 97%   BMI 40.84 kg/m   Physical Exam MUSCULOSKELETAL: Limited range of motion in shoulder with pain on movement. Tenderness in the back. Good strength in arm.  Wt Readings from Last 3 Encounters:  08/07/24 253 lb (114.8 kg)  07/30/24 254 lb (115.2 kg)  07/29/24 254 lb (115.2 kg)    Physical Exam Vitals reviewed.  Constitutional:      General: She is not in acute distress.    Appearance: Normal appearance. She is normal weight. She is not ill-appearing, toxic-appearing or diaphoretic.  HENT:     Head: Normocephalic.  Cardiovascular:     Rate and Rhythm: Normal rate.  Pulmonary:     Effort: Pulmonary effort is normal.  Musculoskeletal:        General: Normal range of motion.     Left shoulder: Tenderness present. No swelling or bony tenderness. Normal range of motion. Normal strength.  Left forearm: Swelling and tenderness present.     Cervical back: No bony tenderness.  Neurological:     General: No focal deficit present.     Mental Status: She is alert and oriented to person, place, and time. Mental status is at baseline.  Psychiatric:        Mood and Affect: Mood normal.        Behavior: Behavior normal.        Thought Content: Thought content normal.        Judgment: Judgment normal.          Results   Assessment & Plan:   Assessment and Plan Assessment & Plan Muscle spasm and musculoskeletal pain of the left back and shoulder Chronic musculoskeletal pain and muscle spasms on the left side, exacerbated by sleeping  positions. Likely related to chemotherapy-induced muscle tightness. Differential includes muscular versus joint origin, with muscular origin more likely. Pain is severe enough to disrupt sleep. - Prescribed tizanidine , starting with half a tablet at night, with the option to increase to one tablet every 8-12 hours as needed. - Recommended use of heat and massage for symptomatic relief. - Suggested use of lidocaine  patches for localized pain relief. - Advised against using ibuprofen  while on diclofenac . - Referred to physical therapy for further management.  Moderate obstructive sleep apnea Difficulty tolerating CPAP mask due to discomfort and breathing issues. Current CPAP usage is inconsistent due to mask issues. Referral to a pulmonologist specializing in sleep apnea for further management is planned. - Referred to pulmonologist Dr. Jess for CPAP management and recommendations. - Advised contacting CPAP supplier to discuss returning the current mask and exploring other options.  Left peroneal tendinitis Chronic left peroneal tendinitis managed with physical therapy. - Continue physical therapy for left peroneal tendinitis.  Left knee pain with lateral meniscal tear and patellar maltracking Chronic left knee pain with a small lateral meniscal tear and patellar maltracking. Pain exacerbated by recent trauma from hitting the knee with a car door. MRI confirmed small lateral tear and patellar maltracking. - Continue follow-up with knee specialist for management of meniscal tear and patellar maltracking.        Return if symptoms worsen or fail to improve.     [name], MSN, APRN, FNP-C [facility]         [1]  Current Outpatient Medications:    acetaminophen  (TYLENOL ) 500 MG tablet, Take 2 tablets (1,000 mg total) by mouth every 6 (six) hours as needed for mild pain (pain score 1-3)., Disp: , Rfl:    amLODipine  (NORVASC ) 10 MG tablet, Take 1  tablet (10 mg total) by mouth daily., Disp: 90 tablet, Rfl: 3   anastrozole  (ARIMIDEX ) 1 MG tablet, Take 1 mg by mouth daily., Disp: , Rfl:    B Complex Vitamins (B COMPLEX PO), Take 1 tablet by mouth daily., Disp: , Rfl:    BREO ELLIPTA 200-25 MCG/ACT AEPB, 1 puff daily., Disp: , Rfl:    busPIRone  (BUSPAR ) 10 MG tablet, Take 10 mg by mouth daily., Disp: , Rfl:    busPIRone  (BUSPAR ) 7.5 MG tablet, Take 7.5 mg by mouth 2 (two) times daily., Disp: , Rfl:    Calcium  Carbonate+Vitamin D  (CALCIUM  600+D3) 600-200 MG-UNIT TABS, Take 1 tablet by mouth in the morning and at bedtime., Disp: 180 tablet, Rfl: 3   cetirizine (ZYRTEC) 10 MG tablet, Take 10 mg by mouth daily as needed for allergies., Disp: , Rfl:    diclofenac  (VOLTAREN )  75 MG EC tablet, Take 1 tablet (75 mg total) by mouth 2 (two) times daily., Disp: 30 tablet, Rfl: 0   escitalopram  (LEXAPRO ) 20 MG tablet, TAKE 1 TABLET BY MOUTH EVERY DAY, Disp: 90 tablet, Rfl: 3   hydrochlorothiazide  (HYDRODIURIL ) 25 MG tablet, Take 1 tablet (25 mg total) by mouth daily., Disp: 90 tablet, Rfl: 3   ibuprofen  (ADVIL ) 600 MG tablet, Take 1 tablet (600 mg total) by mouth every 8 (eight) hours as needed for moderate pain (pain score 4-6)., Disp: 60 tablet, Rfl: 1   montelukast (SINGULAIR) 10 MG tablet, Take 10 mg by mouth at bedtime as needed (allergies)., Disp: , Rfl:    PROAIR HFA 108 (90 Base) MCG/ACT inhaler, Inhale 1-2 puffs into the lungs every 4 (four) hours as needed for wheezing or shortness of breath., Disp: , Rfl:    spironolactone  (ALDACTONE ) 25 MG tablet, Take 1 tablet (25 mg total) by mouth daily., Disp: 90 tablet, Rfl: 3   tiZANidine  (ZANAFLEX ) 4 MG tablet, Take 1/2 to one tablet po at bedtime prn muscle spasm, Disp: 30 tablet, Rfl: 0   valsartan  (DIOVAN ) 320 MG tablet, TAKE 1 TABLET BY MOUTH EVERY DAY, Disp: 90 tablet, Rfl: 3  "

## 2024-08-07 NOTE — Telephone Encounter (Signed)
 Copied from CRM #[id number]. Topic: Clinical - Prescription Issue >> Aug 07, 2024 10:19 AM Amy Watson wrote: Reason for CRM: Patient called in stated she went to the pharmacy for her prescription, and pharmacy stated they were a interaction regarding the new medications and medications that she is taking would like a callback to followup with this so she can get her medication

## 2024-08-07 NOTE — Telephone Encounter (Signed)
 The diclofenac  has a contraindication with valsartan , hydrochlorothiazide , and something else. Pt wants to make sure it is safe to take. We will have to advise the pharmacy.

## 2024-08-11 NOTE — Addendum Note (Signed)
 Addended by: CORWIN ANTU on: 08/11/2024 06:03 PM   Modules accepted: Orders

## 2024-08-12 ENCOUNTER — Ambulatory Visit: Admitting: Surgery

## 2024-08-13 DIAGNOSIS — M25571 Pain in right ankle and joints of right foot: Secondary | ICD-10-CM | POA: Diagnosis not present

## 2024-08-16 ENCOUNTER — Other Ambulatory Visit

## 2024-08-16 ENCOUNTER — Ambulatory Visit: Admitting: Oncology

## 2024-08-20 ENCOUNTER — Ambulatory Visit: Admitting: Pharmacist

## 2024-08-20 NOTE — Patient Instructions (Signed)

## 2024-08-20 NOTE — Progress Notes (Signed)
 Patient ID: [name]                 DOB: October 17, 1970                    MRN: [id number]     HPI: Amy Watson is a 54 y.o. female patient referred to pharmacy clinic by Dr. Argentina to initiate GLP1-RA therapy. PMH is significant for resistant HTN, OSA (AHI 17.9), PVCs, breast cancer and obesity. Most recent BMI 41.3 kg/m .  Zepbound  was denied in Nov as formulary exclusion.  Patient presents today to Pharm.D. clinic.  She does have a diagnosis of sleep apnea with an AHI greater than 15 however her insurance excludes all weight loss med patients regardless of indication.  Unfortunately her plan did not renew in January likely thought and it will not renew until May therefore Zepbound  is still formulary exclusion.  We did discuss cash pay options but they are not affordable for patient.   Diet: lighter meals, staying away from fast food Salad with chicken/steak Coffee in AM w/ vanilla protein drink Roast beef wrap Drink: water , coffee, soda here and there but doesn't keep in the house  Exercise: upper body weights, squats, was walking but hurt knee - needs replacement  Family History:  Family History  Problem Relation Age of Onset   Breast cancer Mother 61   Diabetes Father    Hypertension Father    Stroke Maternal Aunt    Skin cancer Maternal Grandfather    Asthma Brother        Younger   Asthma Maternal Aunt      Social History: rare ETOH, no tobacco  Labs: Lab Results  Component Value Date   HGBA1C 5.8 09/14/2023    Wt Readings from Last 1 Encounters:  08/07/24 253 lb (114.8 kg)    BP Readings from Last 1 Encounters:  08/07/24 134/82   Pulse Readings from Last 1 Encounters:  08/07/24 78       Component Value Date/Time   CHOL 156 09/14/2023 0750   CHOL 186 07/17/2019 0844   TRIG 140.0 09/14/2023 0750   HDL 42.80 09/14/2023 0750   HDL 54 07/17/2019 0844   CHOLHDL 4 09/14/2023 0750   VLDL 28.0 09/14/2023 0750   LDLCALC 86 09/14/2023 0750    LDLCALC 114 (H) 07/17/2019 0844    Past Medical History:  Diagnosis Date   Allergy    Anxiety    Arthritis    Asthma    Many years   Cancer (HCC) 01/2024   DCIS.left breast   COVID-19    Depression    Dyspnea    Gallbladder sludge 2025   Hypertension    Many Years   Obesity    PONV (postoperative nausea and vomiting)    slow to wake up after gallbladder   Pre-diabetes    Sleep apnea 2025   cpap    Medications Ordered Prior to Encounter[1]  Allergies[2]   Assessment/Plan:  1. Weight loss - Patient has not met goal of at least 5% of body weight loss with comprehensive lifestyle modifications alone in the past 3-6 months. Pharmacotherapy is appropriate to pursue as augmentation.  However at this current time patient cannot afford.  In case medication is more affordable in the future I confirmed patient has no personal or family history of medullary thyroid  carcinoma (MTC) or Multiple Endocrine Neoplasia syndrome type 2 (MEN 2). Confirmed no history of gallstones or pancreatitis. Injection technique reviewed  at today's visit.  Advised patient on common side effects including nausea, diarrhea, dyspepsia, decreased appetite, and fatigue. Counseled patient on reducing meal size and how to titrate medication to minimize side effects. Counseled patient to call if intolerable side effects or if experiencing dehydration, abdominal pain, or dizziness. Along with pharmacotherapy, the patient will follow dietary modifications and aim for at least 150 minutes of moderate-intensity exercise per week, plus resistance training twice a week (as recommended by the American Heart Association). This resistance training--such as weightlifting, bodyweight exercises, or using resistance bands, adapted to the patients ability--will help prevent muscle loss.  Patient will reach out to me in May and her insurance resets or if she decides she can afford the cash price options.  Ja Pistole D Nohely Whitehorn, Pharm.JONETTA SARAN, CPP [facility] [address], [place], [place] [address]  Phone: [phone]; Fax: [phone]       [1]  Current Outpatient Medications on File Prior to Visit  Medication Sig Dispense Refill   acetaminophen  (TYLENOL ) 500 MG tablet Take 2 tablets (1,000 mg total) by mouth every 6 (six) hours as needed for mild pain (pain score 1-3).     amLODipine  (NORVASC ) 10 MG tablet Take 1 tablet (10 mg total) by mouth daily. 90 tablet 3   anastrozole  (ARIMIDEX ) 1 MG tablet Take 1 mg by mouth daily.     B Complex Vitamins (B COMPLEX PO) Take 1 tablet by mouth daily.     BREO ELLIPTA 200-25 MCG/ACT AEPB 1 puff daily.     busPIRone  (BUSPAR ) 10 MG tablet Take 10 mg by mouth daily.     busPIRone  (BUSPAR ) 7.5 MG tablet Take 7.5 mg by mouth 2 (two) times daily.     Calcium  Carbonate+Vitamin D  (CALCIUM  600+D3) 600-200 MG-UNIT TABS Take 1 tablet by mouth in the morning and at bedtime. 180 tablet 3   cetirizine (ZYRTEC) 10 MG tablet Take 10 mg by mouth daily as needed for allergies.     escitalopram  (LEXAPRO ) 20 MG tablet TAKE 1 TABLET BY MOUTH EVERY DAY 90 tablet 3   hydrochlorothiazide  (HYDRODIURIL ) 25 MG tablet Take 1 tablet (25 mg total) by mouth daily. 90 tablet 3   ibuprofen  (ADVIL ) 600 MG tablet Take 1 tablet (600 mg total) by mouth every 8 (eight) hours as needed for moderate pain (pain score 4-6). 60 tablet 1   montelukast (SINGULAIR) 10 MG tablet Take 10 mg by mouth at bedtime as needed (allergies).     PROAIR HFA 108 (90 Base) MCG/ACT inhaler Inhale 1-2 puffs into the lungs every 4 (four) hours as needed for wheezing or shortness of breath.     spironolactone  (ALDACTONE ) 25 MG tablet Take 1 tablet (25 mg total) by mouth daily. 90 tablet 3   tiZANidine  (ZANAFLEX ) 4 MG tablet Take 1/2 to one tablet po at bedtime prn muscle spasm 30 tablet 0   valsartan  (DIOVAN ) 320 MG tablet TAKE 1 TABLET BY MOUTH EVERY DAY 90 tablet 3   No current  facility-administered medications on file prior to visit.  [2]  Allergies Allergen Reactions   Prozac [Fluoxetine] Other (See Comments)    fatigue   Azithromycin Other (See Comments)    C. Diff./  Pain

## 2024-08-21 ENCOUNTER — Inpatient Hospital Stay: Attending: Oncology | Admitting: Occupational Therapy

## 2024-08-21 DIAGNOSIS — D0512 Intraductal carcinoma in situ of left breast: Secondary | ICD-10-CM | POA: Insufficient documentation

## 2024-08-21 DIAGNOSIS — Z79899 Other long term (current) drug therapy: Secondary | ICD-10-CM | POA: Insufficient documentation

## 2024-08-21 NOTE — Therapy (Signed)
  [facility] - A Dept Of [facility] [address] [place], [place], [address] Phone: [phone]   Fax:  [phone]  Occupational Therapy Screen   Patient Details  Name: Amy Watson MRN: [id number] Date of Birth: [birth year] No data recorded  Encounter Date: 08/21/2024   OT End of Session - 08/21/24 0953     Visit Number 0          Past Medical History:  Diagnosis Date   Allergy    Anxiety    Arthritis    Asthma    Many years   Cancer (HCC) 01/2024   DCIS.left breast   COVID-19    Depression    Dyspnea    Gallbladder sludge 2025   Hypertension    Many Years   Obesity    PONV (postoperative nausea and vomiting)    slow to wake up after gallbladder   Pre-diabetes    Sleep apnea 2025   cpap    Past Surgical History:  Procedure Laterality Date   BREAST BIOPSY Left 02/12/2024   MM LT BREAST BX W LOC DEV 1ST LESION IMAGE BX SPEC STEREO GUIDE 02/12/2024 [facility]   BREAST BIOPSY  02/22/2024   MM LT BREAST SAVI/RF TAG 1ST LESION MAMMO GUIDE 02/22/2024 [facility]   BREAST LUMPECTOMY WITH RADIO FREQUENCY LOCALIZER Left 02/29/2024   Procedure: BREAST LUMPECTOMY WITH RADIO FREQUENCY LOCALIZER;  Surgeon: Desiderio Schanz, MD;  Location: [facility];  Service: General;  Laterality: Left;   CHOLECYSTECTOMY     COLONOSCOPY     DENTAL SURGERY     FOOT SURGERY     right, mortons neuroma rescetion   FOOT SURGERY     right plantar fascia torn   Knee arthoscopy Left 2025   NECK SURGERY     laser surgery    There were no vitals filed for this visit.  Dr Camelia 07/30/24 visit:  PLAN: Patient had no lymph nodes dissected at the time of her lumpectomy for ductal carcinoma in situ.  She also had no axillary radiation as part of her treatment plan with radiation so lymph node and involvement or problems should not be considered reason for any of her symptoms.  I have expressed to her that there is scar  tissue from surgery that can be exacerbated over time and is different in every patient.  I have also raise the possibility of Arimidex  causing some increased pain and I have asked her to stop that medication for the next 2 to 3 weeks.  I will also referring her to physical therapy for evaluation and possible therapy.  She is a follow-up with me in April I will see her back at that time.  Patient is close to call sooner if any problems worsen or persist.    OT SCREEN 08/21/24   LYMPHEDEMA/ONCOLOGY QUESTIONNAIRE - 08/21/24 0001       Right Upper Extremity Lymphedema   15 cm Proximal to Olecranon Process 44.5 cm    10 cm Proximal to Olecranon Process 40.5 cm    Olecranon Process 30 cm      Left Upper Extremity Lymphedema   15 cm Proximal to Olecranon Process 43 cm    10 cm Proximal to Olecranon Process 39 cm    Olecranon Process 29.5 cm         Patient had left lumpectomy on 7/7 for this 25 by Dr. Desiderio no lymph nodes removed Then had radiation  that finished 05/03/2025. Patient referred by Dr. Camelia radiation MD  Patient arrives with reports of increased tenderness and pain in the left breast since after radiation.  Patient was seen by her PCP as well as her surgeon.  Was put on muscle relaxer and anti-inflammatory. Patient report pain did decrease with medication use to 4-6/10.  Prior to that it was like a 9/10 as well as tenderness mostly in bottom half and lateral breast on the left.  With a pull over the anterior shoulder and pec muscle.  With overhead reach. Upon assessment patient do have some fibrotic areas on the bottom half and lateral breast.  As well as tenderness and tightness in the pectoralis major muscle with reaching overhead. Patient was educated in manual massage twice a day for 3 to 5 minutes As well as provided her Chip bag to use for the next 3 to 4 weeks on fibrotic areas Patient can perform 2 x day after her manual massage to fibrotic areas R AAA to L 20 reps - MLD   Also reviewed with patient some active assisted range of motion on the wall for shoulder flexion and abduction 10 reps to 3 times a day As well as 3-5 times a day to perform scapular retraction on doorframe 10 reps  Patient at risk for lymphedema low because of no lymph nodes removed no radiation to the lymph nodes.  But did provide her with some information and reviewed with her article that was in the cancer journal  Patient to perform home program for 3 to 4 weeks and contact me if needed follow-up.                                   Visit Diagnosis: Ductal carcinoma in situ (DCIS) of left breast    Problem List Patient Active Problem List   Diagnosis Date Noted   Radiation dermatitis 05/16/2024   Genetic testing 02/27/2024   Ductal carcinoma in situ (DCIS) of left breast 02/19/2024   Postmenopausal 02/19/2024   Family history of cancer 02/19/2024   Moderate obstructive sleep apnea 12/05/2023   Gallbladder sludge 10/26/2023   Prediabetes 10/09/2023   History of syncope 05/15/2023   Elevated LDL cholesterol level 12/20/2021   Mild persistent asthma, uncomplicated 05/03/2021   Allergic rhinitis 05/03/2021   Adjustment disorder with mixed anxiety and depressed mood 11/19/2020   Vitamin D  deficiency 05/06/2020   Primary osteoarthritis of both hands 04/10/2019   Hypertension 06/18/2014   Morbid obesity (HCC)     Ancel Peters, OT 08/21/2024, 10:00 AM  [facility] - A Dept Of [place]. [facility] [address], [address] [place], [place], [address] Phone: [phone]   Fax:  [phone]  Name: Amy Watson MRN: [id number] Date of Birth: 1970-10-30

## 2024-08-23 ENCOUNTER — Encounter: Payer: Self-pay | Admitting: Sleep Medicine

## 2024-08-23 ENCOUNTER — Ambulatory Visit: Admitting: Sleep Medicine

## 2024-08-23 VITALS — BP 130/80 | HR 88 | Temp 98.1°F | Ht 66.0 in | Wt 260.0 lb

## 2024-08-23 DIAGNOSIS — G4733 Obstructive sleep apnea (adult) (pediatric): Secondary | ICD-10-CM | POA: Diagnosis not present

## 2024-08-23 DIAGNOSIS — Z6841 Body Mass Index (BMI) 40.0 and over, adult: Secondary | ICD-10-CM

## 2024-08-23 DIAGNOSIS — I10 Essential (primary) hypertension: Secondary | ICD-10-CM | POA: Diagnosis not present

## 2024-08-23 NOTE — Patient Instructions (Signed)

## 2024-08-23 NOTE — Progress Notes (Signed)
 "      Name:Amy Watson MRN: [id number] DOB: 05/14/1971   CHIEF COMPLAINT:  ESTABLISH CARE FOR OSA   HISTORY OF PRESENT ILLNESS: Amy Watson is a 54 y.o. w/ a h/o OSA, HTN, anxiety, depression, breast CA s/p lumpectomy, asthma and morbid obesity who presents to establish care for OSA. Reports that she was initially diagnosed with moderate OSA in 2025 and was subsequently started on CPAP therapy. Reports difficulty using CPAP therapy consistently due to pressure discomfort. She is currently using the Airfit F30i FFM. States that she does not feel refreshed upon awakening with CPAP therapy. Denies snoring. Denies any nocturnal awakenings. Reports significant weight changes. Denies morning headaches, RLS symptoms, dream enactment, cataplexy, hypnagogic or hypnapompic hallucinations. Denies a family history of sleep apnea. Denies drowsy driving. Drinks 1-2 cups of coffee daily, occasional alcohol use, denies tobacco or illicit drug use.   Bedtime 9-11 pm Sleep onset 10 mins Rise time 6:30 am   PAST MEDICAL HISTORY :   has a past medical history of Allergy, Anxiety, Arthritis, Asthma, Breast cancer (HCC), Cancer (HCC) (01/2024), Contusion of left knee (07/17/2024), COVID-19, Depression, Dyspnea, Gallbladder sludge (2025), Hypertension, Obesity, Peroneal tendinitis, right leg (07/24/2024), PONV (postoperative nausea and vomiting), Pre-diabetes, Sleep apnea (2025), Strain of peroneal tendon (11/27/2023), Swelling of left knee (07/17/2024), and Tear of medial meniscus of knee (08/11/2023).  has a past surgical history that includes Neck surgery; Foot surgery; Foot surgery; Colonoscopy; Knee arthoscopy (Left, 2025); Dental surgery; Breast biopsy (Left, 02/12/2024); Cholecystectomy; Breast biopsy (02/22/2024); and Breast lumpectomy with radio frequency localizer (Left, 02/29/2024). Prior to Admission medications  Medication Sig Start Date End Date Taking? Authorizing Provider  acetaminophen   (TYLENOL ) 500 MG tablet Take 2 tablets (1,000 mg total) by mouth every 6 (six) hours as needed for mild pain (pain score 1-3). 02/29/24  Yes Piscoya, Aloysius, MD  amLODipine  (NORVASC ) 10 MG tablet Take 1 tablet (10 mg total) by mouth daily. 09/28/23  Yes Dugal, Tabitha, FNP  anastrozole  (ARIMIDEX ) 1 MG tablet Take 1 mg by mouth daily.   Yes [provider]  B Complex Vitamins (B COMPLEX PO) Take 1 tablet by mouth daily.   Yes [provider]  BREO ELLIPTA 200-25 MCG/ACT AEPB 1 puff daily. 07/26/24  Yes [provider]  busPIRone  (BUSPAR ) 7.5 MG tablet Take 7.5 mg by mouth 2 (two) times daily.   Yes [provider]  Calcium  Carbonate+Vitamin D  (CALCIUM  600+D3) 600-200 MG-UNIT TABS Take 1 tablet by mouth in the morning and at bedtime. 03/23/21  Yes Velma Raisin, MD  cetirizine (ZYRTEC) 10 MG tablet Take 10 mg by mouth daily as needed for allergies.   Yes [provider]  diclofenac  (VOLTAREN ) 75 MG EC tablet Take 75 mg by mouth 2 (two) times daily.   Yes [provider]  escitalopram  (LEXAPRO ) 20 MG tablet TAKE 1 TABLET BY MOUTH EVERY DAY 11/30/23  Yes Dugal, Tabitha, FNP  hydrochlorothiazide  (HYDRODIURIL ) 25 MG tablet Take 1 tablet (25 mg total) by mouth daily. 06/27/24 09/25/24 Yes Argentina Clap, MD  ibuprofen  (ADVIL ) 600 MG tablet Take 1 tablet (600 mg total) by mouth every 8 (eight) hours as needed for moderate pain (pain score 4-6). 02/29/24  Yes Piscoya, Jose, MD  montelukast (SINGULAIR) 10 MG tablet Take 10 mg by mouth at bedtime as needed (allergies).   Yes [provider]  PROAIR HFA 108 (90 Base) MCG/ACT inhaler Inhale 1-2 puffs into the lungs every 4 (four) hours as needed for wheezing or shortness  of breath. 11/08/18  Yes [provider]  spironolactone  (ALDACTONE ) 25 MG tablet Take 1 tablet (25 mg total) by mouth daily. 02/20/24  Yes Raford Riggs, MD  tiZANidine  (ZANAFLEX ) 4 MG tablet Take 1/2 to one tablet po at bedtime prn  muscle spasm 08/07/24  Yes Dugal, Tabitha, FNP  valsartan  (DIOVAN ) 320 MG tablet TAKE 1 TABLET BY MOUTH EVERY DAY 11/30/23  Yes Corwin Antu, FNP   Allergies[1]  FAMILY HISTORY:  family history includes Asthma in her brother and maternal aunt; Breast cancer (age of onset: 67) in her mother; Diabetes in her father; Hypertension in her father; Skin cancer in her maternal grandfather; Stroke in her maternal aunt. SOCIAL HISTORY:  reports that she has never smoked. She has never been exposed to tobacco smoke. She has never used smokeless tobacco. She reports current alcohol use. She reports that she does not use drugs.   Review of Systems:  Gen:  Denies  fever, sweats, chills weight loss  HEENT: Denies blurred vision, double vision, ear pain, eye pain, hearing loss, nose bleeds, sore throat Cardiac:  No dizziness, chest pain or heaviness, chest tightness,edema, No JVD Resp:   No cough, -sputum production, -shortness of breath,-wheezing, -hemoptysis,  Gi: Denies swallowing difficulty, stomach pain, nausea or vomiting, diarrhea, constipation, bowel incontinence Gu:  Denies bladder incontinence, burning urine Ext:   Denies Joint pain, stiffness or swelling Skin: Denies  skin rash, easy bruising or bleeding or hives Endoc:  Denies polyuria, polydipsia , polyphagia or weight change Psych:   Denies depression, insomnia or hallucinations  Other:  All other systems negative  VITAL SIGNS: BP 130/80   Pulse 88   Temp 98.1 F (36.7 C)   Ht 5' 6 (1.676 m)   Wt 260 lb (117.9 kg)   LMP 08/15/2021   SpO2 98%   BMI 41.97 kg/m    Physical Examination:   General Appearance: No distress  EYES PERRLA, EOM intact.   NECK Supple, No JVD Pulmonary: normal breath sounds, No wheezing.  CardiovascularNormal S1,S2.  No m/r/g.   Abdomen: Benign, Soft, non-tender. Skin:   warm, no rashes, no ecchymosis  Extremities: normal, no cyanosis, clubbing. Neuro:without focal findings,  speech normal   PSYCHIATRIC: Mood, affect within normal limits.   ASSESSMENT AND PLAN  OSA Counseled patient on increasing CPAP compliance. To improve compliance, will change pressure range. Discussed the consequences of untreated sleep apnea. Advised not to drive drowsy for safety of patient and others. Will follow up in 3 months.    HTN Stable, on current management. Following with PCP.   Morbid obesity Counseled patient on diet and lifestyle modification.    Patient satisfied with Plan of action and management. All questions answered  I spent a total of 64 minutes reviewing chart data, face-to-face evaluation with the patient, counseling and coordination of care as detailed above.    Syble Picco, M.D.  Sleep Medicine [facility]           [1]  Allergies Allergen Reactions   Prozac [Fluoxetine] Other (See Comments)    fatigue   Azithromycin Other (See Comments)    C. Diff./  Pain    "

## 2024-08-28 ENCOUNTER — Encounter: Payer: Self-pay | Admitting: Family

## 2024-08-28 DIAGNOSIS — F4323 Adjustment disorder with mixed anxiety and depressed mood: Secondary | ICD-10-CM

## 2024-08-29 ENCOUNTER — Other Ambulatory Visit: Payer: Self-pay | Admitting: Family

## 2024-08-29 DIAGNOSIS — M62830 Muscle spasm of back: Secondary | ICD-10-CM

## 2024-08-31 ENCOUNTER — Other Ambulatory Visit: Payer: Self-pay | Admitting: Oncology

## 2024-09-05 ENCOUNTER — Encounter: Payer: Self-pay | Admitting: Family

## 2024-09-06 MED ORDER — BUSPIRONE HCL 5 MG PO TABS: 5.0000 mg | ORAL_TABLET | Freq: Three times a day (TID) | ORAL | 1 refills | Status: DC

## 2024-09-09 ENCOUNTER — Telehealth (HOSPITAL_BASED_OUTPATIENT_CLINIC_OR_DEPARTMENT_OTHER): Payer: Self-pay | Admitting: *Deleted

## 2024-09-09 MED ORDER — BUSPIRONE HCL 5 MG PO TABS: 5.0000 mg | ORAL_TABLET | Freq: Two times a day (BID) | ORAL | Status: AC

## 2024-09-09 NOTE — Telephone Encounter (Signed)
"  ° °  Pre-operative Risk Assessment    Patient Name: Amy Watson  DOB: 1970/11/09 MRN: [id number]   Date of last office visit: 06/27/2024 Date of next office visit: None  Request for Surgical Clearance    Procedure:  Left total knee arthorplasty  Date of Surgery:  Clearance 11/11/24                                 Surgeon:  Dr. Donnice Car Surgeon's Group or Practice Name:  [facility] Phone number:  [phone] Fax number:  [phone]   Type of Clearance Requested:   - Medical    Type of Anesthesia:  Spinal   Additional requests/questions:    Signed, Edsel Grayce Sanders   09/09/2024, 8:12 AM   "

## 2024-09-09 NOTE — Addendum Note (Signed)
 Addended by: CORWIN ANTU on: 09/09/2024 09:06 AM   Modules accepted: Orders

## 2024-09-10 ENCOUNTER — Inpatient Hospital Stay: Admitting: Oncology

## 2024-09-10 ENCOUNTER — Inpatient Hospital Stay

## 2024-09-11 ENCOUNTER — Telehealth: Payer: Self-pay

## 2024-09-11 NOTE — Telephone Encounter (Signed)
 If this requires a primary care clearance then she'd need an appt.  What is this surgery for

## 2024-09-11 NOTE — Telephone Encounter (Signed)
 Form printed and put in your box for review. Patient has recent clearance with cardiology not sure if she needs appointment with us ? Please advise.

## 2024-09-11 NOTE — Telephone Encounter (Signed)
 Appointment is scheduled for 11/04/24  at 1:40pm. Med req and consent are complete. Call patient at [phone]

## 2024-09-11 NOTE — Telephone Encounter (Signed)
" ° °  Name: Amy Watson  DOB: [birth year]  MRN: [id number]  Primary Cardiologist: Caron Poser, MD   Preoperative team, please contact this patient and set up a phone call appointment for further preoperative risk assessment. Please obtain consent and complete medication review. Thank you for your help.  I confirm that guidance regarding antiplatelet and oral anticoagulation therapy has been completed and, if necessary, noted below. - N/A  I also confirmed the patient resides in the state of [place] . As per [facility] Medical Board telemedicine laws, the patient must reside in the state in which the provider is licensed.   Jaysa Kise E Sherica Paternostro, PA-C 09/11/2024, 11:41 AM [facility]    "

## 2024-09-12 NOTE — Telephone Encounter (Signed)
 Reviewed the surgical clearance form that was faxed to us . Pt is to have a left total knee arthroplasty. Per the form, pt needs to have a CBC, BMET and A1c as well. I will send a message to pt asking her to make an appointment for this form to be filled out.

## 2024-09-13 ENCOUNTER — Ambulatory Visit (INDEPENDENT_AMBULATORY_CARE_PROVIDER_SITE_OTHER): Admitting: Family

## 2024-09-13 ENCOUNTER — Ambulatory Visit: Payer: Self-pay | Admitting: Family

## 2024-09-13 ENCOUNTER — Telehealth: Payer: Self-pay | Admitting: Family

## 2024-09-13 VITALS — BP 118/82 | HR 82 | Temp 98.9°F | Ht 66.0 in | Wt 250.2 lb

## 2024-09-13 DIAGNOSIS — J4520 Mild intermittent asthma, uncomplicated: Secondary | ICD-10-CM | POA: Insufficient documentation

## 2024-09-13 DIAGNOSIS — Z01818 Encounter for other preprocedural examination: Secondary | ICD-10-CM | POA: Diagnosis not present

## 2024-09-13 DIAGNOSIS — R791 Abnormal coagulation profile: Secondary | ICD-10-CM

## 2024-09-13 DIAGNOSIS — G4733 Obstructive sleep apnea (adult) (pediatric): Secondary | ICD-10-CM | POA: Diagnosis not present

## 2024-09-13 DIAGNOSIS — E7800 Pure hypercholesterolemia, unspecified: Secondary | ICD-10-CM | POA: Diagnosis not present

## 2024-09-13 DIAGNOSIS — I10 Essential (primary) hypertension: Secondary | ICD-10-CM | POA: Diagnosis not present

## 2024-09-13 DIAGNOSIS — R7303 Prediabetes: Secondary | ICD-10-CM

## 2024-09-13 DIAGNOSIS — R7989 Other specified abnormal findings of blood chemistry: Secondary | ICD-10-CM

## 2024-09-13 DIAGNOSIS — Z01812 Encounter for preprocedural laboratory examination: Secondary | ICD-10-CM

## 2024-09-13 DIAGNOSIS — F5104 Psychophysiologic insomnia: Secondary | ICD-10-CM | POA: Insufficient documentation

## 2024-09-13 DIAGNOSIS — M25562 Pain in left knee: Secondary | ICD-10-CM

## 2024-09-13 LAB — POCT URINALYSIS DIP (CLINITEK)
Bilirubin, UA: NEGATIVE
Blood, UA: NEGATIVE
Glucose, UA: NEGATIVE mg/dL
Ketones, POC UA: NEGATIVE mg/dL
Leukocytes, UA: NEGATIVE
Nitrite, UA: NEGATIVE
POC PROTEIN,UA: NEGATIVE
Spec Grav, UA: 1.015
Urobilinogen, UA: 0.2 U/dL
pH, UA: 6.5

## 2024-09-13 LAB — COMPREHENSIVE METABOLIC PANEL WITH GFR
ALT: 91 U/L — ABNORMAL HIGH (ref 3–35)
AST: 46 U/L — ABNORMAL HIGH (ref 5–37)
Albumin: 4.4 g/dL (ref 3.5–5.2)
Alkaline Phosphatase: 95 U/L (ref 39–117)
BUN: 11 mg/dL (ref 6–23)
CO2: 30 meq/L (ref 19–32)
Calcium: 9 mg/dL (ref 8.4–10.5)
Chloride: 103 meq/L (ref 96–112)
Creatinine, Ser: 0.54 mg/dL (ref 0.40–1.20)
GFR: 104.06 mL/min
Glucose, Bld: 81 mg/dL (ref 70–99)
Potassium: 3.6 meq/L (ref 3.5–5.1)
Sodium: 139 meq/L (ref 135–145)
Total Bilirubin: 0.7 mg/dL (ref 0.2–1.2)
Total Protein: 7.5 g/dL (ref 6.0–8.3)

## 2024-09-13 LAB — CBC
HCT: 42.7 % (ref 36.0–46.0)
Hemoglobin: 14.7 g/dL (ref 12.0–15.0)
MCHC: 34.4 g/dL (ref 30.0–36.0)
MCV: 89.9 fl (ref 78.0–100.0)
Platelets: 300 10*3/uL (ref 150.0–400.0)
RBC: 4.75 Mil/uL (ref 3.87–5.11)
RDW: 12.9 % (ref 11.5–15.5)
WBC: 6.5 10*3/uL (ref 4.0–10.5)

## 2024-09-13 LAB — POCT URINE PREGNANCY: Preg Test, Ur: NEGATIVE

## 2024-09-13 LAB — PROTIME-INR
INR: 1.3 ratio — ABNORMAL HIGH (ref 0.8–1.0)
Prothrombin Time: 14.8 s — ABNORMAL HIGH (ref 9.6–13.1)

## 2024-09-13 LAB — HEMOGLOBIN A1C: Hgb A1c MFr Bld: 5.4 % (ref 4.6–6.5)

## 2024-09-13 MED ORDER — TRAZODONE HCL 50 MG PO TABS: 25.0000 mg | ORAL_TABLET | Freq: Every evening | ORAL | Status: AC | PRN

## 2024-09-13 NOTE — Telephone Encounter (Signed)
 Good morning   Patient having left knee arthroplasty scheduled for 11/11/24  You see her for OSA  Mostly compliant with CPAP asymptomatic at current Surgery with spinal anesthesia Overall well from pulmonary perspective?

## 2024-09-13 NOTE — Telephone Encounter (Signed)
 I have notified the patient. She said she is still trying to adjust to the new mask and pressure. She said she just got a new medication to help with her sleep. She is hoping that the medication will help her sleep better with the CPAP. She will let us  know if she needs anything before her appt in April.  Nothing further needed.

## 2024-09-13 NOTE — Progress Notes (Signed)
 "  Assessment & Plan:   Assessment and Plan Assessment & Plan Pre-operative evaluation for left total knee arthroplasty Pre-operative evaluation for left total knee arthroplasty scheduled for March 30th. Cardiology clearance received on January 26th. No significant cardiac or pulmonary issues. Moderate obstructive sleep apnea managed with CPAP. No recent respiratory symptoms. No diabetes, cardiovascular disease, kidney disease, CHF, or coronary artery disease. No arrhythmias or myocardial infarction. No significant extracardiac findings. No active symptoms today. Pulmonary function tests and heart monitors have been stable. No stress test performed. No need for pregnancy test due to post-menopausal status. - Ordered CBC, CMP, PT INR, and spot urine prior to surgery. - reviewed labs, Slightly elevated PT INR  Possible increased bleeding risk but pt can still proceed with surgery at surgeons discretion. Will repeat pt inr in four weeks, if still elevated refer to hematology.  Pt with sleep apnea however she is working on compliance with CPAP and at current no respiratory symptoms, pt followed by Dr. Jess for OSA   Cleared from primary care perspective.   Moderate obstructive sleep apnea Managed with CPAP. She reports compliance with CPAP, though initially had issues with nasal mask due to high air pressure. Currently using a different mask. No recent episodes of apnea reported. - Continue CPAP therapy.   Primary hypertension No recent issues reported. - Continue current antihypertensive regimen.  Psychophysiologic insomnia Managed with trazodone  50 mg. She reports improved sleep quality with trazodone , able to sleep through the night with minimal awakenings. - Continue trazodone  50 mg for insomnia and f/u with psychiatry as scheduled  Mild intermittent asthma Currently asymptomatic - Continue current asthma management plan.  Arthralgia of left knee Arthralgia of left knee with history  of lateral meniscal tear and patellar maltracking. Scheduled for left total knee arthroplasty. Previous arthroscopy for torn MCL and cartilage issues. Reports horizontal tear, lateral meniscal tear, and bone bruising. - Proceed with left total knee arthroplasty as scheduled.  Muscle spasm and musculoskeletal pain of the left back and shoulder Muscle spasm and musculoskeletal pain of the left back and shoulder. Currently undergoing physical therapy. Chemotherapy paused due to muscle issues. - Continue physical therapy exercises at home. - Plan for 12-week physical therapy post-knee surgery.        I have independently evaluated patient.  Amy Watson is a 54 y.o. female who is above average risk for a intermediate risk surgery.  There are not modifiable risk factors (smoking, etc). [name]'s RCRI/NSQIP calculation for MACE is: 2   Review meds: If indicated, NO ACE-I/ARB day of surgery. OK to do BB or statin day of (esp if vascular sx)  Return if symptoms worsen or fail to improve.  [name], MSN, APRN, FNP-C [facility]  Subjective:      HPI: Pt is a 53 y.o. female who is here for preoperative clearance for left total knee arthroplasty with spinal anesthesia  Discussed the use of AI scribe software for clinical note transcription with the patient, who gave verbal consent to proceed.  History of Present Illness Amy Watson is a 54 year old female who presents for preoperative evaluation for left total knee arthroplasty.  She is scheduled for a left total knee arthroplasty on March 30th, following a history of left knee surgery a year ago for a torn MCL and cartilage issues. Current knee issues include a horizontal tear, lateral tear, bone bruising, and a crooked patella. She has been undergoing physical therapy for perineal  tendon issues in her right foot.  She has sleep apnea and uses a CPAP machine, although she has had issues  with the nasal mask due to high air pressure. She has been mostly compliant with CPAP use. Recently, she started taking trazodone  50 mg for sleep, which has improved her sleep quality.  Her past medical history includes breast cancer, for which she has paused chemotherapy due to muscle issues from the shoulder to the sternum. She is doing home exercises and plans to start a 12-week physical therapy program after her knee surgery.  She has no history of heart attack or arrhythmias and sees a cardiologist for blood pressure management. She has not needed her rescue inhaler for asthma in several months, which she attributes to menopause. Today blood pressure in office 118/82, well controlled.   1) High Risk Cardiac Conditions:  1) Recent MI - No.  2) Decompensated Heart Failure - No.  3) Unstable angina - No.  4) Symptomatic arrythmia - No.  5) Sx Valvular Disease - No.  2) Intermediate Risk Factors: DM, CKD, CVA, CHF, CAD - Yes.   OSA   2) Functional Status: > 4 mets (Walk, run, climb stairs) Yes.  Amy Watson Activity Status Index: 9.84 mets   Physically limited by left knee, has not needed rescue inhaler in 'ages'  3) Surgery Specific Risk:           Intermediate (Carotid, Head and Neck, Orthopaedic )            4) Further Noninvasive evaluation:   1) EKG - No. Has cardiologist, last EKG 04/26/24 pending clearance    Hx of MI, CVA, CAD, DM, CKD  2) Echo - No. Last echo 06/07/24, echo is normal    Worsening dyspnea or CHF without an echo in the past year  3) Stress Testing - Active Cardiac Disease - No.  4) CXR No. Asthma well controlled overall no active symptoms today    If asymptomatic, healthy, no respiratory symptoms it is not needed  5)PFTs Yes.   Sees pulmonary regularly for OSA compliant overall with CPAP   OSA, OHS, or significant cardiopulmonary history  5) Need for medical therapy - Beta Blocker, Statins indicated ? No.     Social history:  Relevant past medical,  surgical, family and social history reviewed and updated as indicated. Interim medical history since our last visit reviewed.  Allergies and medications reviewed and updated.  DATA REVIEWED: CHART IN EPIC  ROS: Negative unless specifically indicated above in HPI.   Current Medications[1]      Objective:    BP 118/82 (BP Location: Left Arm, Patient Position: Sitting, Cuff Size: Large)   Pulse 82   Temp 98.9 F (37.2 C) (Temporal)   Ht 5' 6 (1.676 m)   Wt 250 lb 3.2 oz (113.5 kg)   LMP 08/15/2021   SpO2 98%   BMI 40.38 kg/m   Wt Readings from Last 3 Encounters:  09/13/24 250 lb 3.2 oz (113.5 kg)  08/23/24 260 lb (117.9 kg)  08/20/24 256 lb (116.1 kg)    Physical Exam EXTREMITIES: No swelling in extremities. Physical Exam Constitutional:      General: She is not in acute distress.    Appearance: Normal appearance. She is normal weight. She is not ill-appearing.  HENT:     Head: Normocephalic.     Right Ear: Tympanic membrane normal.     Left Ear: Tympanic membrane normal.     Nose: Nose normal.  Mouth/Throat:     Mouth: Mucous membranes are moist.  Eyes:     Extraocular Movements: Extraocular movements intact.     Pupils: Pupils are equal, round, and reactive to light.  Cardiovascular:     Rate and Rhythm: Normal rate and regular rhythm.  Pulmonary:     Effort: Pulmonary effort is normal.     Breath sounds: Normal breath sounds.  Abdominal:     General: Abdomen is flat. Bowel sounds are normal.     Palpations: Abdomen is soft.     Tenderness: There is no guarding or rebound.  Musculoskeletal:        General: Normal range of motion.     Cervical back: Normal range of motion.  Skin:    General: Skin is warm.     Capillary Refill: Capillary refill takes less than 2 seconds.  Neurological:     General: No focal deficit present.     Mental Status: She is alert.  Psychiatric:        Mood and Affect: Mood normal.        Behavior: Behavior normal.         Thought Content: Thought content normal.        Judgment: Judgment normal.          Results        [1]  Current Outpatient Medications:    acetaminophen  (TYLENOL ) 500 MG tablet, Take 2 tablets (1,000 mg total) by mouth every 6 (six) hours as needed for mild pain (pain score 1-3)., Disp: , Rfl:    amLODipine  (NORVASC ) 10 MG tablet, Take 1 tablet (10 mg total) by mouth daily., Disp: 90 tablet, Rfl: 3   anastrozole  (ARIMIDEX ) 1 MG tablet, TAKE 1 TABLET BY MOUTH EVERY DAY, Disp: 90 tablet, Rfl: 1   B Complex Vitamins (B COMPLEX PO), Take 1 tablet by mouth daily., Disp: , Rfl:    BREO ELLIPTA 200-25 MCG/ACT AEPB, 1 puff daily., Disp: , Rfl:    busPIRone  (BUSPAR ) 5 MG tablet, Take 1 tablet (5 mg total) by mouth 2 (two) times daily., Disp: , Rfl:    Calcium  Carbonate+Vitamin D  (CALCIUM  600+D3) 600-200 MG-UNIT TABS, Take 1 tablet by mouth in the morning and at bedtime., Disp: 180 tablet, Rfl: 3   cetirizine (ZYRTEC) 10 MG tablet, Take 10 mg by mouth daily as needed for allergies., Disp: , Rfl:    diclofenac  (VOLTAREN ) 75 MG EC tablet, Take 75 mg by mouth 2 (two) times daily., Disp: , Rfl:    escitalopram  (LEXAPRO ) 20 MG tablet, TAKE 1 TABLET BY MOUTH EVERY DAY, Disp: 90 tablet, Rfl: 3   hydrochlorothiazide  (HYDRODIURIL ) 25 MG tablet, Take 1 tablet (25 mg total) by mouth daily., Disp: 90 tablet, Rfl: 3   ibuprofen  (ADVIL ) 600 MG tablet, Take 1 tablet (600 mg total) by mouth every 8 (eight) hours as needed for moderate pain (pain score 4-6)., Disp: 60 tablet, Rfl: 1   montelukast (SINGULAIR) 10 MG tablet, Take 10 mg by mouth at bedtime as needed (allergies)., Disp: , Rfl:    PROAIR HFA 108 (90 Base) MCG/ACT inhaler, Inhale 1-2 puffs into the lungs every 4 (four) hours as needed for wheezing or shortness of breath., Disp: , Rfl:    spironolactone  (ALDACTONE ) 25 MG tablet, Take 1 tablet (25 mg total) by mouth daily., Disp: 90 tablet, Rfl: 3   tiZANidine  (ZANAFLEX ) 4 MG tablet, Take 1/2 to one  tablet po at bedtime prn muscle spasm, Disp: 30 tablet, Rfl: 0  traZODone  (DESYREL ) 50 MG tablet, Take 0.5-1 tablets (25-50 mg total) by mouth at bedtime as needed for sleep., Disp: , Rfl:    valsartan  (DIOVAN ) 320 MG tablet, TAKE 1 TABLET BY MOUTH EVERY DAY, Disp: 90 tablet, Rfl: 3  "

## 2024-09-13 NOTE — Progress Notes (Signed)
 noted

## 2024-09-13 NOTE — Telephone Encounter (Signed)
90 day compliance report

## 2024-09-18 ENCOUNTER — Other Ambulatory Visit: Payer: Self-pay

## 2024-09-18 NOTE — Telephone Encounter (Signed)
 Slightly elevated PT INR  Possible increased bleeding risk but pt can still proceed with surgery at surgeons discretion. Pt with sleep apnea however she is working on compliance with CPAP and at current no respiratory symptoms.   Cleared from primary care perspective.

## 2024-09-19 ENCOUNTER — Telehealth: Payer: Self-pay | Admitting: Family

## 2024-09-19 NOTE — Telephone Encounter (Signed)
 Clearance form completed Please fax

## 2024-09-19 NOTE — Telephone Encounter (Signed)
 Form has been faxed to [facility]

## 2024-09-19 NOTE — Telephone Encounter (Signed)
 NOTED

## 2024-10-11 ENCOUNTER — Other Ambulatory Visit

## 2024-10-18 ENCOUNTER — Inpatient Hospital Stay

## 2024-10-18 ENCOUNTER — Inpatient Hospital Stay: Admitting: Oncology

## 2024-11-04 ENCOUNTER — Ambulatory Visit

## 2024-11-21 ENCOUNTER — Ambulatory Visit: Admitting: Sleep Medicine

## 2024-11-26 ENCOUNTER — Ambulatory Visit: Admitting: Sleep Medicine

## 2024-12-09 ENCOUNTER — Ambulatory Visit: Admitting: Radiation Oncology
